# Patient Record
Sex: Female | Born: 1985 | Race: White | Hispanic: No | Marital: Married | Smoking: Current every day smoker
Health system: Southern US, Community
[De-identification: ages and names within clinical notes are randomized; demographics above are authoritative.]

## PROBLEM LIST (undated history)

## (undated) ENCOUNTER — Inpatient Hospital Stay (HOSPITAL_COMMUNITY): Payer: Self-pay

## (undated) DIAGNOSIS — F429 Obsessive-compulsive disorder, unspecified: Secondary | ICD-10-CM

## (undated) DIAGNOSIS — N179 Acute kidney failure, unspecified: Secondary | ICD-10-CM

## (undated) DIAGNOSIS — N289 Disorder of kidney and ureter, unspecified: Secondary | ICD-10-CM

## (undated) DIAGNOSIS — R112 Nausea with vomiting, unspecified: Secondary | ICD-10-CM

## (undated) DIAGNOSIS — F419 Anxiety disorder, unspecified: Secondary | ICD-10-CM

## (undated) DIAGNOSIS — Z9889 Other specified postprocedural states: Secondary | ICD-10-CM

## (undated) DIAGNOSIS — I10 Essential (primary) hypertension: Secondary | ICD-10-CM

## (undated) DIAGNOSIS — R002 Palpitations: Secondary | ICD-10-CM

## (undated) DIAGNOSIS — J189 Pneumonia, unspecified organism: Secondary | ICD-10-CM

## (undated) DIAGNOSIS — J45909 Unspecified asthma, uncomplicated: Secondary | ICD-10-CM

## (undated) DIAGNOSIS — R51 Headache: Secondary | ICD-10-CM

## (undated) DIAGNOSIS — R519 Headache, unspecified: Secondary | ICD-10-CM

## (undated) DIAGNOSIS — F3170 Bipolar disorder, currently in remission, most recent episode unspecified: Secondary | ICD-10-CM

## (undated) DIAGNOSIS — F1490 Cocaine use, unspecified, uncomplicated: Secondary | ICD-10-CM

## (undated) HISTORY — PX: LEG SURGERY: SHX1003

## (undated) HISTORY — DX: Acute kidney failure, unspecified: N17.9

## (undated) HISTORY — PX: HAND SURGERY: SHX662

## (undated) HISTORY — PX: FINGER SURGERY: SHX640

---
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
## 2005-04-18 ENCOUNTER — Inpatient Hospital Stay (HOSPITAL_COMMUNITY): Admission: EM | Admit: 2005-04-18 | Discharge: 2005-05-01 | Payer: Self-pay | Admitting: Emergency Medicine

## 2005-05-17 ENCOUNTER — Encounter: Admission: RE | Admit: 2005-05-17 | Discharge: 2005-06-29 | Payer: Self-pay | Admitting: Orthopedic Surgery

## 2005-06-30 ENCOUNTER — Ambulatory Visit (HOSPITAL_COMMUNITY): Admission: RE | Admit: 2005-06-30 | Discharge: 2005-06-30 | Payer: Self-pay | Admitting: Internal Medicine

## 2005-06-30 ENCOUNTER — Ambulatory Visit: Payer: Self-pay | Admitting: Internal Medicine

## 2005-08-16 ENCOUNTER — Ambulatory Visit (HOSPITAL_COMMUNITY): Admission: RE | Admit: 2005-08-16 | Discharge: 2005-08-16 | Payer: Self-pay | Admitting: Internal Medicine

## 2005-08-16 ENCOUNTER — Encounter (INDEPENDENT_AMBULATORY_CARE_PROVIDER_SITE_OTHER): Payer: Self-pay | Admitting: Internal Medicine

## 2006-12-01 ENCOUNTER — Emergency Department (HOSPITAL_COMMUNITY): Admission: EM | Admit: 2006-12-01 | Discharge: 2006-12-01 | Payer: Self-pay | Admitting: *Deleted

## 2006-12-04 ENCOUNTER — Emergency Department (HOSPITAL_COMMUNITY): Admission: EM | Admit: 2006-12-04 | Discharge: 2006-12-04 | Payer: Self-pay | Admitting: Emergency Medicine

## 2007-02-07 ENCOUNTER — Encounter: Payer: Self-pay | Admitting: Emergency Medicine

## 2007-02-07 ENCOUNTER — Inpatient Hospital Stay (HOSPITAL_COMMUNITY): Admission: EM | Admit: 2007-02-07 | Discharge: 2007-02-13 | Payer: Self-pay | Admitting: Emergency Medicine

## 2007-02-12 ENCOUNTER — Ambulatory Visit: Payer: Self-pay | Admitting: *Deleted

## 2007-02-13 ENCOUNTER — Inpatient Hospital Stay (HOSPITAL_COMMUNITY): Admission: RE | Admit: 2007-02-13 | Discharge: 2007-02-21 | Payer: Self-pay | Admitting: *Deleted

## 2007-07-18 ENCOUNTER — Ambulatory Visit (HOSPITAL_COMMUNITY): Admission: RE | Admit: 2007-07-18 | Discharge: 2007-07-18 | Payer: Self-pay | Admitting: Family Medicine

## 2007-08-17 ENCOUNTER — Ambulatory Visit (HOSPITAL_COMMUNITY): Admission: RE | Admit: 2007-08-17 | Discharge: 2007-08-17 | Payer: Self-pay | Admitting: Family Medicine

## 2007-09-06 ENCOUNTER — Ambulatory Visit (HOSPITAL_COMMUNITY): Admission: RE | Admit: 2007-09-06 | Discharge: 2007-09-06 | Payer: Self-pay | Admitting: Family Medicine

## 2007-12-28 ENCOUNTER — Emergency Department (HOSPITAL_COMMUNITY): Admission: EM | Admit: 2007-12-28 | Discharge: 2007-12-28 | Payer: Self-pay | Admitting: Family Medicine

## 2008-01-06 ENCOUNTER — Emergency Department (HOSPITAL_COMMUNITY): Admission: EM | Admit: 2008-01-06 | Discharge: 2008-01-07 | Payer: Self-pay | Admitting: Family Medicine

## 2008-02-08 ENCOUNTER — Emergency Department (HOSPITAL_COMMUNITY): Admission: EM | Admit: 2008-02-08 | Discharge: 2008-02-08 | Payer: Self-pay | Admitting: Family Medicine

## 2008-02-13 ENCOUNTER — Ambulatory Visit (HOSPITAL_COMMUNITY): Payer: Self-pay | Admitting: Psychology

## 2008-02-27 ENCOUNTER — Ambulatory Visit (HOSPITAL_COMMUNITY): Payer: Self-pay | Admitting: Psychiatry

## 2008-03-06 ENCOUNTER — Other Ambulatory Visit: Payer: Self-pay

## 2008-03-06 ENCOUNTER — Ambulatory Visit: Payer: Self-pay | Admitting: *Deleted

## 2008-03-07 ENCOUNTER — Inpatient Hospital Stay (HOSPITAL_COMMUNITY): Admission: RE | Admit: 2008-03-07 | Discharge: 2008-03-14 | Payer: Self-pay | Admitting: *Deleted

## 2008-03-17 ENCOUNTER — Ambulatory Visit (HOSPITAL_COMMUNITY): Payer: Self-pay | Admitting: Psychology

## 2008-06-25 ENCOUNTER — Ambulatory Visit (HOSPITAL_COMMUNITY): Payer: Self-pay | Admitting: Psychiatry

## 2008-06-30 ENCOUNTER — Ambulatory Visit (HOSPITAL_COMMUNITY): Payer: Self-pay | Admitting: Psychology

## 2008-07-14 ENCOUNTER — Ambulatory Visit (HOSPITAL_COMMUNITY): Payer: Self-pay | Admitting: Psychology

## 2008-08-04 ENCOUNTER — Ambulatory Visit (HOSPITAL_COMMUNITY): Payer: Self-pay | Admitting: Psychiatry

## 2008-08-18 ENCOUNTER — Ambulatory Visit (HOSPITAL_COMMUNITY): Payer: Self-pay | Admitting: Psychology

## 2008-09-01 ENCOUNTER — Ambulatory Visit (HOSPITAL_COMMUNITY): Admission: RE | Admit: 2008-09-01 | Discharge: 2008-09-01 | Payer: Self-pay | Admitting: Family Medicine

## 2008-09-03 ENCOUNTER — Ambulatory Visit (HOSPITAL_COMMUNITY): Payer: Self-pay | Admitting: Psychology

## 2008-09-08 ENCOUNTER — Ambulatory Visit (HOSPITAL_COMMUNITY): Payer: Self-pay | Admitting: Psychiatry

## 2008-10-01 ENCOUNTER — Ambulatory Visit (HOSPITAL_COMMUNITY): Payer: Self-pay | Admitting: Psychiatry

## 2008-10-29 ENCOUNTER — Ambulatory Visit (HOSPITAL_COMMUNITY): Payer: Self-pay | Admitting: Psychiatry

## 2008-11-10 ENCOUNTER — Ambulatory Visit (HOSPITAL_COMMUNITY): Payer: Self-pay | Admitting: Psychology

## 2008-11-15 ENCOUNTER — Emergency Department (HOSPITAL_COMMUNITY): Admission: EM | Admit: 2008-11-15 | Discharge: 2008-11-15 | Payer: Self-pay | Admitting: Family Medicine

## 2008-11-15 ENCOUNTER — Encounter (INDEPENDENT_AMBULATORY_CARE_PROVIDER_SITE_OTHER): Payer: Self-pay | Admitting: Emergency Medicine

## 2008-11-15 ENCOUNTER — Ambulatory Visit: Payer: Self-pay | Admitting: Vascular Surgery

## 2008-11-15 ENCOUNTER — Emergency Department (HOSPITAL_COMMUNITY): Admission: EM | Admit: 2008-11-15 | Discharge: 2008-11-15 | Payer: Self-pay | Admitting: Emergency Medicine

## 2008-11-26 ENCOUNTER — Ambulatory Visit (HOSPITAL_COMMUNITY): Payer: Self-pay | Admitting: Psychiatry

## 2008-12-15 ENCOUNTER — Ambulatory Visit (HOSPITAL_COMMUNITY): Payer: Self-pay | Admitting: Psychology
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
## 2009-01-28 ENCOUNTER — Ambulatory Visit (HOSPITAL_COMMUNITY): Payer: Self-pay | Admitting: Psychiatry

## 2009-02-12 ENCOUNTER — Ambulatory Visit (HOSPITAL_COMMUNITY): Admission: RE | Admit: 2009-02-12 | Discharge: 2009-02-12 | Payer: Self-pay | Admitting: Family Medicine

## 2009-03-23 ENCOUNTER — Ambulatory Visit (HOSPITAL_COMMUNITY): Payer: Self-pay | Admitting: Psychiatry

## 2009-04-04 ENCOUNTER — Emergency Department (HOSPITAL_COMMUNITY): Admission: EM | Admit: 2009-04-04 | Discharge: 2009-04-04 | Payer: Self-pay | Admitting: Emergency Medicine

## 2009-04-10 ENCOUNTER — Ambulatory Visit (HOSPITAL_COMMUNITY): Payer: Self-pay | Admitting: Licensed Clinical Social Worker

## 2009-04-22 ENCOUNTER — Ambulatory Visit (HOSPITAL_COMMUNITY): Payer: Self-pay | Admitting: Licensed Clinical Social Worker

## 2009-05-07 ENCOUNTER — Ambulatory Visit (HOSPITAL_COMMUNITY): Payer: Self-pay | Admitting: Licensed Clinical Social Worker

## 2009-05-25 ENCOUNTER — Ambulatory Visit (HOSPITAL_COMMUNITY): Payer: Self-pay | Admitting: Psychiatry

## 2009-05-25 ENCOUNTER — Ambulatory Visit (HOSPITAL_COMMUNITY): Payer: Self-pay | Admitting: Licensed Clinical Social Worker

## 2009-06-01 ENCOUNTER — Emergency Department (HOSPITAL_COMMUNITY): Admission: EM | Admit: 2009-06-01 | Discharge: 2009-06-01 | Payer: Self-pay | Admitting: Emergency Medicine

## 2009-06-05 ENCOUNTER — Ambulatory Visit (HOSPITAL_COMMUNITY): Payer: Self-pay | Admitting: Licensed Clinical Social Worker

## 2009-06-10 ENCOUNTER — Ambulatory Visit (HOSPITAL_COMMUNITY): Payer: Self-pay | Admitting: Licensed Clinical Social Worker

## 2009-06-17 ENCOUNTER — Ambulatory Visit (HOSPITAL_COMMUNITY): Payer: Self-pay | Admitting: Psychiatry

## 2009-06-24 ENCOUNTER — Ambulatory Visit (HOSPITAL_COMMUNITY): Payer: Self-pay | Admitting: Licensed Clinical Social Worker

## 2009-07-07 ENCOUNTER — Ambulatory Visit (HOSPITAL_COMMUNITY): Payer: Self-pay | Admitting: Licensed Clinical Social Worker

## 2009-07-07 ENCOUNTER — Encounter: Admission: RE | Admit: 2009-07-07 | Discharge: 2009-07-07 | Payer: Self-pay | Admitting: Diagnostic Neuroimaging

## 2009-08-12 ENCOUNTER — Ambulatory Visit (HOSPITAL_COMMUNITY): Payer: Self-pay | Admitting: Psychiatry

## 2009-08-18 ENCOUNTER — Ambulatory Visit (HOSPITAL_COMMUNITY): Payer: Self-pay | Admitting: Licensed Clinical Social Worker

## 2009-08-25 ENCOUNTER — Ambulatory Visit (HOSPITAL_COMMUNITY): Payer: Self-pay | Admitting: Licensed Clinical Social Worker

## 2009-09-01 ENCOUNTER — Ambulatory Visit (HOSPITAL_COMMUNITY): Payer: Self-pay | Admitting: Licensed Clinical Social Worker

## 2009-09-04 ENCOUNTER — Emergency Department (HOSPITAL_COMMUNITY): Admission: EM | Admit: 2009-09-04 | Discharge: 2009-09-05 | Payer: Self-pay | Admitting: Emergency Medicine

## 2009-09-08 ENCOUNTER — Ambulatory Visit (HOSPITAL_COMMUNITY): Payer: Self-pay | Admitting: Licensed Clinical Social Worker

## 2009-09-17 ENCOUNTER — Ambulatory Visit (HOSPITAL_COMMUNITY): Payer: Self-pay | Admitting: Licensed Clinical Social Worker

## 2009-09-29 ENCOUNTER — Ambulatory Visit (HOSPITAL_COMMUNITY): Payer: Self-pay | Admitting: Licensed Clinical Social Worker

## 2009-10-12 ENCOUNTER — Ambulatory Visit (HOSPITAL_COMMUNITY): Payer: Self-pay | Admitting: Psychiatry

## 2009-10-21 ENCOUNTER — Ambulatory Visit (HOSPITAL_COMMUNITY): Payer: Self-pay | Admitting: Licensed Clinical Social Worker

## 2009-10-29 ENCOUNTER — Ambulatory Visit (HOSPITAL_COMMUNITY): Payer: Self-pay | Admitting: Licensed Clinical Social Worker

## 2009-11-19 ENCOUNTER — Ambulatory Visit (HOSPITAL_COMMUNITY): Payer: Self-pay | Admitting: Licensed Clinical Social Worker

## 2009-11-27 ENCOUNTER — Ambulatory Visit (HOSPITAL_COMMUNITY): Payer: Self-pay | Admitting: Licensed Clinical Social Worker

## 2009-12-09 ENCOUNTER — Ambulatory Visit (HOSPITAL_COMMUNITY): Payer: Self-pay | Admitting: Licensed Clinical Social Worker

## 2009-12-25 ENCOUNTER — Ambulatory Visit (HOSPITAL_COMMUNITY): Payer: Self-pay | Admitting: Licensed Clinical Social Worker

## 2009-12-28 ENCOUNTER — Ambulatory Visit: Payer: Self-pay | Admitting: Internal Medicine

## 2010-01-04 ENCOUNTER — Ambulatory Visit (HOSPITAL_COMMUNITY): Payer: Self-pay | Admitting: Psychiatry

## 2010-01-05 ENCOUNTER — Ambulatory Visit (HOSPITAL_COMMUNITY): Payer: Self-pay | Admitting: Licensed Clinical Social Worker

## 2010-01-13 ENCOUNTER — Ambulatory Visit (HOSPITAL_COMMUNITY): Payer: Self-pay | Admitting: Licensed Clinical Social Worker

## 2010-01-28 ENCOUNTER — Emergency Department (HOSPITAL_COMMUNITY)
Admission: EM | Admit: 2010-01-28 | Discharge: 2010-01-28 | Payer: Self-pay | Source: Home / Self Care | Admitting: Emergency Medicine

## 2010-02-01 LAB — URINALYSIS, ROUTINE W REFLEX MICROSCOPIC
Bilirubin Urine: NEGATIVE
Hgb urine dipstick: NEGATIVE
Ketones, ur: NEGATIVE mg/dL
Nitrite: NEGATIVE
Protein, ur: NEGATIVE mg/dL
Specific Gravity, Urine: 1.018 (ref 1.005–1.030)
Urine Glucose, Fasting: NEGATIVE mg/dL
Urobilinogen, UA: 0.2 mg/dL (ref 0.0–1.0)
pH: 7 (ref 5.0–8.0)

## 2010-02-01 LAB — POCT I-STAT, CHEM 8
BUN: 9 mg/dL (ref 6–23)
Calcium, Ion: 1.19 mmol/L (ref 1.12–1.32)
Chloride: 107 mEq/L (ref 96–112)
Creatinine, Ser: 0.7 mg/dL (ref 0.4–1.2)
Glucose, Bld: 100 mg/dL — ABNORMAL HIGH (ref 70–99)
HCT: 43 % (ref 36.0–46.0)
Hemoglobin: 14.6 g/dL (ref 12.0–15.0)
Potassium: 4.1 mEq/L (ref 3.5–5.1)
Sodium: 140 mEq/L (ref 135–145)
TCO2: 27 mmol/L (ref 0–100)

## 2010-02-01 LAB — URINE MICROSCOPIC-ADD ON

## 2010-02-01 LAB — POCT PREGNANCY, URINE: Preg Test, Ur: NEGATIVE

## 2010-02-07 ENCOUNTER — Encounter: Payer: Self-pay | Admitting: Family Medicine

## 2010-02-27 ENCOUNTER — Emergency Department (HOSPITAL_COMMUNITY)
Admission: EM | Admit: 2010-02-27 | Discharge: 2010-02-27 | Disposition: A | Discharge status: Home / Self Care | Payer: Medicare Other | Attending: Emergency Medicine | Admitting: Emergency Medicine

## 2010-02-27 DIAGNOSIS — X789XXA Intentional self-harm by unspecified sharp object, initial encounter: Secondary | ICD-10-CM | POA: Insufficient documentation

## 2010-02-27 DIAGNOSIS — S61509A Unspecified open wound of unspecified wrist, initial encounter: Secondary | ICD-10-CM | POA: Insufficient documentation

## 2010-02-27 LAB — URINALYSIS, ROUTINE W REFLEX MICROSCOPIC
Bilirubin Urine: NEGATIVE
Ketones, ur: NEGATIVE mg/dL
Leukocytes, UA: NEGATIVE
Urine Glucose, Fasting: NEGATIVE mg/dL
pH: 6 (ref 5.0–8.0)

## 2010-02-27 LAB — DIFFERENTIAL
Eosinophils Absolute: 0.3 10*3/uL (ref 0.0–0.7)
Lymphocytes Relative: 38 % (ref 12–46)
Lymphs Abs: 4.6 10*3/uL — ABNORMAL HIGH (ref 0.7–4.0)
Monocytes Absolute: 0.6 10*3/uL (ref 0.1–1.0)
Monocytes Relative: 5 % (ref 3–12)
Neutro Abs: 6.7 10*3/uL (ref 1.7–7.7)
Neutrophils Relative %: 55 % (ref 43–77)

## 2010-02-27 LAB — BASIC METABOLIC PANEL
Calcium: 9.2 mg/dL (ref 8.4–10.5)
GFR calc Af Amer: >60 mL/min (ref >60)
GFR calc non Af Amer: >60 mL/min (ref >60)
Sodium: 139 mEq/L (ref 135–145)

## 2010-02-27 LAB — CBC
Hemoglobin: 14.5 g/dL (ref 12.0–15.0)
MCH: 31.5 pg (ref 26.0–34.0)
MCHC: 36.6 g/dL — ABNORMAL HIGH (ref 30.0–36.0)
MCV: 86.1 fL (ref 78.0–100.0)
Platelets: 275 10*3/uL (ref 150–400)
RDW: 13.5 % (ref 11.5–15.5)
WBC: 12.3 10*3/uL — ABNORMAL HIGH (ref 4.0–10.5)

## 2010-02-27 LAB — URINE MICROSCOPIC-ADD ON

## 2010-02-27 LAB — RAPID URINE DRUG SCREEN, HOSP PERFORMED
Cocaine: NOT DETECTED
Opiates: NOT DETECTED

## 2010-04-01 LAB — BASIC METABOLIC PANEL
BUN: 10 mg/dL (ref 6–23)
CO2: 24 mEq/L (ref 19–32)
Calcium: 9.1 mg/dL (ref 8.4–10.5)
Creatinine, Ser: 0.82 mg/dL (ref 0.4–1.2)
GFR calc Af Amer: >60 mL/min (ref >60)
Glucose, Bld: 95 mg/dL (ref 70–99)

## 2010-04-01 LAB — URINALYSIS, ROUTINE W REFLEX MICROSCOPIC
Hgb urine dipstick: NEGATIVE
Ketones, ur: NEGATIVE mg/dL
Protein, ur: NEGATIVE mg/dL
Urobilinogen, UA: 0.2 mg/dL (ref 0.0–1.0)

## 2010-04-01 LAB — DIFFERENTIAL
Basophils Relative: 0 % (ref 0–1)
Eosinophils Absolute: 0.2 10*3/uL (ref 0.0–0.7)
Eosinophils Relative: 2 % (ref 0–5)
Neutrophils Relative %: 56 % (ref 43–77)

## 2010-04-01 LAB — CBC
MCH: 30.4 pg (ref 26.0–34.0)
MCHC: 34.3 g/dL (ref 30.0–36.0)
MCV: 88.7 fL (ref 78.0–100.0)
Platelets: 332 10*3/uL (ref 150–400)
RBC: 4.33 MIL/uL (ref 3.87–5.11)
RDW: 13.8 % (ref 11.5–15.5)

## 2010-04-01 LAB — D-DIMER, QUANTITATIVE: D-Dimer, Quant: 0.22 ug/mL-FEU (ref 0.00–0.48)

## 2010-04-11 LAB — POCT RAPID STREP A (OFFICE): Streptococcus, Group A Screen (Direct): NEGATIVE

## 2010-04-22 LAB — POCT I-STAT, CHEM 8
Calcium, Ion: 1.22 mmol/L (ref 1.12–1.32)
Glucose, Bld: 99 mg/dL (ref 70–99)
HCT: 40 % (ref 36.0–46.0)
TCO2: 25 mmol/L (ref 0–100)

## 2010-05-03 LAB — POCT PREGNANCY, URINE: Preg Test, Ur: NEGATIVE

## 2010-05-03 LAB — POCT URINALYSIS DIP (DEVICE)
Ketones, ur: NEGATIVE mg/dL
Protein, ur: NEGATIVE mg/dL
Specific Gravity, Urine: 1.015 (ref 1.005–1.030)
Urobilinogen, UA: 0.2 mg/dL (ref 0.0–1.0)
pH: 7.5 (ref 5.0–8.0)

## 2010-05-04 LAB — DIFFERENTIAL
Basophils Absolute: 0.1 10*3/uL (ref 0.0–0.1)
Eosinophils Relative: 0 % (ref 0–5)
Lymphocytes Relative: 5 % — ABNORMAL LOW (ref 12–46)
Lymphocytes Relative: 29 % (ref 12–46)
Lymphs Abs: 1 10*3/uL (ref 0.7–4.0)
Lymphs Abs: 3.4 10*3/uL (ref 0.7–4.0)
Monocytes Relative: 8 % (ref 3–12)
Neutro Abs: 16.9 10*3/uL — ABNORMAL HIGH (ref 1.7–7.7)
Neutrophils Relative %: 92 % — ABNORMAL HIGH (ref 43–77)
Neutrophils Relative %: 62 % (ref 43–77)

## 2010-05-04 LAB — URINALYSIS, ROUTINE W REFLEX MICROSCOPIC
Hgb urine dipstick: NEGATIVE
Protein, ur: NEGATIVE mg/dL
Specific Gravity, Urine: 1.026 (ref 1.005–1.030)
Urobilinogen, UA: 0.2 mg/dL (ref 0.0–1.0)

## 2010-05-04 LAB — CBC
HCT: 44 % (ref 36.0–46.0)
HCT: 40.4 % (ref 36.0–46.0)
Hemoglobin: 15.2 g/dL — ABNORMAL HIGH (ref 12.0–15.0)
MCHC: 34.5 g/dL (ref 30.0–36.0)
MCV: 90.5 fL (ref 78.0–100.0)
Platelets: 290 10*3/uL (ref 150–400)
RBC: 4.86 MIL/uL (ref 3.87–5.11)
RBC: 4.4 MIL/uL (ref 3.87–5.11)
WBC: 18.3 10*3/uL — ABNORMAL HIGH (ref 4.0–10.5)
WBC: 11.9 10*3/uL — ABNORMAL HIGH (ref 4.0–10.5)

## 2010-05-04 LAB — RAPID URINE DRUG SCREEN, HOSP PERFORMED
Amphetamines: NOT DETECTED
Barbiturates: NOT DETECTED
Benzodiazepines: NOT DETECTED

## 2010-05-04 LAB — HEPATIC FUNCTION PANEL
Alkaline Phosphatase: 82 U/L (ref 39–117)
Total Bilirubin: 0.5 mg/dL (ref 0.3–1.2)
Total Protein: 6.8 g/dL (ref 6.0–8.3)

## 2010-05-04 LAB — COMPREHENSIVE METABOLIC PANEL
AST: 19 U/L (ref 0–37)
BUN: 9 mg/dL (ref 6–23)
CO2: 24 mEq/L (ref 19–32)
Calcium: 9.5 mg/dL (ref 8.4–10.5)
Chloride: 105 mEq/L (ref 96–112)
Creatinine, Ser: 0.67 mg/dL (ref 0.4–1.2)
GFR calc non Af Amer: >60 mL/min (ref >60)
Glucose, Bld: 125 mg/dL — ABNORMAL HIGH (ref 70–99)
Total Bilirubin: 0.4 mg/dL (ref 0.3–1.2)

## 2010-05-04 LAB — RPR: RPR Ser Ql: NONREACTIVE

## 2010-05-04 LAB — PROLACTIN: Prolactin: 48.8 ng/mL

## 2010-05-04 LAB — URINE MICROSCOPIC-ADD ON
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
## 2010-06-01 NOTE — Group Therapy Note (Signed)
[name], [name]           ACCOUNT NO.:  [id number]   MEDICAL RECORD NO.:  [id number]          PATIENT TYPE:  OUT   LOCATION:  RDC                           FACILITY:  [facility]   PHYSICIAN:  Syed T. Arfeen, M.D.   DATE OF BIRTH:  1985/06/16                                 PROGRESS NOTE   The patient came in today for her follow-up appointment in [place]  office.  The patient continued to endorse mood swings, agitation and  getting easily irritable. She had run out of her BuSpar on Friday and  noticed increased anxiety and agitation.  Initially she thought that  BuSpar was not helping.  However, when she ran out she felt more  anxious.  She also endorsed recently visual hallucination which she  described as that she is seeing things which are not there.  She  endorses she also feels sometimes that someone calls her name.  She  continued to endorse extensive symptoms of OCD. She has been spending a  lot of time for eye makeup or organizing things. Lately she has been so  sensitive if someone touches her. She is concerned about her  relationship with the boyfriend due to increased OCD symptoms.  She  reported that she has been sleeping good on her current medication.  However she still had difficulty controlling her mood swing and  agitation.  She denies any suicidal thoughts or homicidal thoughts.  However, in past 3-4 weeks she did have thoughts of hurting her ex-  boyfriend. It is unclear to her why she had only one time that thought.  She endorses that these thoughts resolved on their own.  She has been  taking Lamictal 300 mg daily, Remeron 30 mg 2 at bedtime, Neurontin 600  mg 1 t.i.d., Prozac 40 mg 2 daily, Seroquel 300 mg 2 at bedtime.  We  have started BuSpar on her last visit which she believes now helped.  She has been seeing Dr. Kieth Brightly for her regular counseling.   ASSESSMENT:  Bipolar disorder, rule out obsessive-compulsive disorder.   PLAN:  I talked to her  increasing her BuSpar to 10 mg twice a day since she  felt better on BuSpar.  I also recommended to try low dose lithium to  control her agitation and mood swings.  She had mentioned that she does  not feel the Lamictal helps her agitation.  However, she is afraid to  stop the medicine wondering if agitation gets worse.  I explained that  she can try lithium and if she is able to tolerate the side effects and  feel better we can gradually cut down the Lamictal.  I explained the  risk and benefits of the lithium and in the future if she starts  tolerating the lithium, we may cut down the Lamictal. She is taking  moderate dose of Seroquel, Prozac and Remeron.  She does not want to  take medicine that causes weight gain since she tried Abilify, Zyprexa  in the past.  She also tried Depakote and stopped due to the weight  gain.  I also recommended to see her primary  care doctor, Dr. Renette Butters and  [facility] in [place], to have a CT scan of the head to  rule out any organic cause of visual hallucinations.  The patient has an  appointment this coming Wednesday, and she will discuss to have a CT  scan of her head on that appointment.  The patient was recommended to  continue seeing Dr. Kieth Brightly for counseling, and I will see her in 3  weeks.  The patient was explained if her visual hallucinations or  suicidal or homicidal thoughts gets worse, then she should call us or  visit the ER which she acknowledged. Follow-up in 3 weeks.     Syed T. Lolly Mustache, M.D.  Electronically Signed    STA/MEDQ  D:  09/08/2008  T:  09/08/2008  Job:  [id number]
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
## 2010-06-04 NOTE — Op Note (Signed)
[name], [name]           ACCOUNT NO.:  [id number]   MEDICAL RECORD NO.:  [id number]          PATIENT TYPE:  INP   LOCATION:  5005                         FACILITY:  [facility]   PHYSICIAN:  Claude Manges. Whitfield, M.D.DATE OF BIRTH:  March 28, 1985   DATE OF PROCEDURE:  04/19/2005  DATE OF DISCHARGE:                                 OPERATIVE REPORT   PREOPERATIVE DIAGNOSIS:  Compartment syndrome, right leg.   POSTOPERATIVE DIAGNOSIS:  Compartment syndrome, right leg.   PROCEDURE:  Four compartment fasciotomy right leg.   SURGEON:  Claude Manges. Cleophas Dunker, M.D.   ASSISTANT:  Richardean Canal, P.A.-C.   ANESTHESIA:  General orotracheal.   COMPLICATIONS:  None.   PROCEDURE:  With the patient comfortable on the operating table and under  general orotracheal anesthesia, a tourniquet was applied to the right thigh.  The right leg was then prepped with Betadine from the mid-thigh to the tips  of the toes.  I did obtain compartment pressure measurements of the  posterior, the deep posterior compartment, and the anterolateral  compartments. Blood pressure was approximately 120/70, the lateral  compartment pressures were in the low 50 mmHg, and probably 30 range in the  posterior compartment. The compartments felt supple, but the patient has had  loss of sensation in the dorsum of her foot and loss of extension of the  great toe and pain with passive flexion/extension of the toes.   The leg was then prepped again with Betadine from the tips of toes to the  mid tourniquet.  Sterile draping was performed.   A longitudinal incision was made over the anterior aspect of the leg halfway  between the tibia and the fibula extending probably 7 cm.  Via sharp  dissection, the incision was carried down to the subcutaneous tissue.  The  leg was elevated and Esmarch exsanguinated with the tourniquet to 325 mmHg  prior to making incision. Via blunt dissection, the soft tissue was then  elevated off the  compartments. The superficial fascia was incised  transversely. The anterior and lateral compartments were identified. The  intermuscular septum was identified that separated the anterior and lateral  compartments. Under direct visualization, fasciotomy was performed along the  anterior compartment initially and in the lateral compartment visualizing  any neurovascular structures and retracted them well out of the operative  field.  The wound was then irrigated and a sterile gauze applied.   A second incision was made longitudinally about 3 cm medial and posterior to  the posterior border of the tibia and there was about a 10 cm gap between  the lateral and medial incisions. Via sharp dissection, the incision was  carried down to the subcutaneous tissue.  Via blunt dissection, the  subcutaneous tissue was elevated off the compartments.  The neurovascular  structures were separated from the fascia bluntly and then elevated. The  fascia along the deep posterior musculature was then incised anteriorly and  posteriorly under direct visualization.  The intermuscular septum was  identified and the superficial deep compartment was also incised was  decompressed.  There was very little bulging of the musculature in the  deep  posterior and the posterior compartments.  There was some bulging of the  muscle in the anterior and the lateral compartments, but the muscle would  twitch and had a normal appearance.   The wounds were irrigated with saline solution.  The vacuum dressings were  then applied to both of the incisions as they were left open.  We had a very  nice seal after applying pressure.  I then aspirated the knee, the patient  had a hematoma that was painful preoperatively.  I removed approximately 45  mL of old blood with a very nice decompression.  A Band-Aid was applied.   The tourniquet was deflated with immediate capillary refill to the toes and  to the skin between the incisions.   The patient tolerated without  complications.      Claude Manges. Cleophas Dunker, M.D.  Electronically Signed     PWW/MEDQ  D:  04/19/2005  T:  04/19/2005  Job:  [id number]
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
## 2010-06-04 NOTE — Op Note (Signed)
NAMEMARIZOL, BORROR           ACCOUNT NO.:  [id number]   MEDICAL RECORD NO.:  [id number]          PATIENT TYPE:  INP   LOCATION:  5005                         FACILITY:  [facility]   PHYSICIAN:  Claude Manges. Whitfield, M.D.DATE OF BIRTH:  [birth year]   DATE OF PROCEDURE:  DATE OF DISCHARGE:                                 OPERATIVE REPORT   Audio too short to transcribe (less than 5 seconds)      Claude Manges. Cleophas Dunker, M.D.     PWW/MEDQ  D:  04/19/2005  T:  04/19/2005  Job:  [id number]
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
## 2010-06-04 NOTE — Op Note (Signed)
NAMEQUANTA, ROBERTSHAW           ACCOUNT NO.:  [id number]   MEDICAL RECORD NO.:  [id number]          PATIENT TYPE:  INP   LOCATION:  5005                         FACILITY:  [facility]   PHYSICIAN:  Claude Manges. Whitfield, M.D.DATE OF BIRTH:  08-07-1985   DATE OF PROCEDURE:  04/23/2005  DATE OF DISCHARGE:                                 OPERATIVE REPORT   PREOPERATIVE DIAGNOSIS:  Open four compartment fasciotomy wounds, right leg.   POSTOPERATIVE DIAGNOSIS:  Open four compartment fasciotomy wounds, right  leg.   PROCEDURE:  I&D of bilateral fasciotomy wounds, right leg. Primary closure  medial leg wound. Application of vacuum dressing lateral leg wound.   SURGEON:  Claude Manges. Cleophas Dunker, M.D.   ASSISTANT:  Arlys John D. Petrarca, P.A.-C.   ANESTHESIA:  General.   COMPLICATIONS:  None.   PROCEDURE:  The patient comfortable on operating table and under general  anesthesia, dressings were removed from the right lower extremity including  the previously applied vacuum dressings. There were multiple blisters  medially, laterally, and inferiorly about the leg from the previous  fracture. The fasciotomy wounds appeared to be nice and clean.  There was no  evidence of avascular tissue or avascular muscle. The leg was then prepped  with Betadine solution from the ankle to the knee.  Sterile draping was  performed. We could approximate the skin edges along the medial wound but  could not laterally so we irrigated with saline both of the wounds. The  medial wound was then closed in several layers with 0-0 and 2-0 Vicryl and  then a running 3-0 subcuticular Prolene over Steri-Strips. Any remaining  blisters were decompressed and we applied Adaptic dressing and 4x4 gauze.  The lateral wound we could not close and accordingly reirrigated and applied  a vacuum dressing for subsequent reattempt at the closure or split-thickness  skin graft. The vacuum dressing was applied.  We had very nice suction.  Sterile drapings were applied.  The patient was then returned to the post  anesthesia recovery room in satisfactory condition.      Claude Manges. Cleophas Dunker, M.D.  Electronically Signed     PWW/MEDQ  D:  04/23/2005  T:  04/24/2005  Job:  [id number]
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
## 2010-06-04 NOTE — Op Note (Signed)
NAMEMUNTAHA, VERMETTE           ACCOUNT NO.:  [id number]   MEDICAL RECORD NO.:  [id number]          PATIENT TYPE:  INP   LOCATION:  5005                         FACILITY:  [facility]   PHYSICIAN:  Claude Manges. Whitfield, M.D.DATE OF BIRTH:  1985-09-14   DATE OF PROCEDURE:  04/23/2005  DATE OF DISCHARGE:                                 OPERATIVE REPORT   PREOPERATIVE DIAGNOSIS:  Open fasciotomy wounds, right leg.   POSTOPERATIVE DIAGNOSIS:  Open fasciotomy wounds, right leg.   PROCEDURE:  1.  Irrigation debridement both fasciotomy wounds, right leg.  2.  Primary closure of medial leg wound.  3.  Application of vacuum dressing to lateral leg wound.   SURGEON:  Claude Manges. Cleophas Dunker, M.D.   ASSISTANT:  Arlys John D. Petrarca, P.A.-C.   ANESTHESIA:  General.   COMPLICATIONS:  None.   PROCEDURE:  With the patient comfortable on the operating room table, and  under general anesthesia; the right lower extremity was prepped with  Betadine solution from the ankle to the knee.  Sterile draping was  performed.  The patient is 4 days status post 4-compartment fasciotomies to  the right lower extremity with application of a vacuum dressing.   The vacuum dressings were removed; and, at that point, we reprepped the leg  with Betadine scrub and again sterile technique.  NOTE:  IN FACT I'M GOING  TO START ALL OVER AGAIN, SO FORGET THAT AND I WILL REDICTATE.      Claude Manges. Cleophas Dunker, M.D.  Electronically Signed     PWW/MEDQ  D:  04/23/2005  T:  04/24/2005  Job:  [id number]

## 2010-08-27 ENCOUNTER — Emergency Department (HOSPITAL_COMMUNITY): Payer: Medicare Other

## 2010-08-27 ENCOUNTER — Observation Stay (HOSPITAL_COMMUNITY)
Admission: EM | Admit: 2010-08-27 | Discharge: 2010-08-29 | Disposition: A | Discharge status: Home / Self Care | Payer: Medicare Other | Attending: Internal Medicine | Admitting: Internal Medicine

## 2010-08-27 DIAGNOSIS — D72829 Elevated white blood cell count, unspecified: Secondary | ICD-10-CM | POA: Insufficient documentation

## 2010-08-27 DIAGNOSIS — R1031 Right lower quadrant pain: Secondary | ICD-10-CM | POA: Insufficient documentation

## 2010-08-27 DIAGNOSIS — R51 Headache: Secondary | ICD-10-CM | POA: Insufficient documentation

## 2010-08-27 DIAGNOSIS — N63 Unspecified lump in unspecified breast: Secondary | ICD-10-CM | POA: Insufficient documentation

## 2010-08-27 DIAGNOSIS — J690 Pneumonitis due to inhalation of food and vomit: Principal | ICD-10-CM | POA: Insufficient documentation

## 2010-08-27 DIAGNOSIS — R0789 Other chest pain: Secondary | ICD-10-CM | POA: Insufficient documentation

## 2010-08-27 DIAGNOSIS — R112 Nausea with vomiting, unspecified: Secondary | ICD-10-CM | POA: Insufficient documentation

## 2010-08-27 DIAGNOSIS — F1910 Other psychoactive substance abuse, uncomplicated: Secondary | ICD-10-CM | POA: Insufficient documentation

## 2010-08-27 DIAGNOSIS — R197 Diarrhea, unspecified: Secondary | ICD-10-CM | POA: Insufficient documentation

## 2010-08-27 DIAGNOSIS — Z79899 Other long term (current) drug therapy: Secondary | ICD-10-CM | POA: Insufficient documentation

## 2010-08-27 DIAGNOSIS — M79609 Pain in unspecified limb: Secondary | ICD-10-CM | POA: Insufficient documentation

## 2010-08-27 DIAGNOSIS — G40909 Epilepsy, unspecified, not intractable, without status epilepticus: Secondary | ICD-10-CM | POA: Insufficient documentation

## 2010-08-27 DIAGNOSIS — R0602 Shortness of breath: Secondary | ICD-10-CM | POA: Insufficient documentation

## 2010-08-27 DIAGNOSIS — F17200 Nicotine dependence, unspecified, uncomplicated: Secondary | ICD-10-CM | POA: Insufficient documentation

## 2010-08-27 DIAGNOSIS — R1012 Left upper quadrant pain: Secondary | ICD-10-CM | POA: Insufficient documentation

## 2010-08-27 DIAGNOSIS — F319 Bipolar disorder, unspecified: Secondary | ICD-10-CM | POA: Insufficient documentation

## 2010-08-27 DIAGNOSIS — F1027 Alcohol dependence with alcohol-induced persisting dementia: Secondary | ICD-10-CM | POA: Insufficient documentation

## 2010-08-27 DIAGNOSIS — F10229 Alcohol dependence with intoxication, unspecified: Secondary | ICD-10-CM | POA: Insufficient documentation

## 2010-08-27 LAB — COMPREHENSIVE METABOLIC PANEL
ALT: 19 U/L (ref 0–35)
Alkaline Phosphatase: 74 U/L (ref 39–117)
BUN: 9 mg/dL (ref 6–23)
CO2: 21 mEq/L (ref 19–32)
Chloride: 103 mEq/L (ref 96–112)
GFR calc Af Amer: >60 mL/min (ref >60)
Glucose, Bld: 177 mg/dL — ABNORMAL HIGH (ref 70–99)
Potassium: 3.4 mEq/L — ABNORMAL LOW (ref 3.5–5.1)
Sodium: 139 mEq/L (ref 135–145)
Total Bilirubin: 0.2 mg/dL — ABNORMAL LOW (ref 0.3–1.2)
Total Protein: 7.6 g/dL (ref 6.0–8.3)

## 2010-08-27 LAB — URINALYSIS, ROUTINE W REFLEX MICROSCOPIC
Bilirubin Urine: NEGATIVE
Hgb urine dipstick: NEGATIVE
Ketones, ur: NEGATIVE mg/dL
Specific Gravity, Urine: 1.02 (ref 1.005–1.030)
Urobilinogen, UA: 0.2 mg/dL (ref 0.0–1.0)

## 2010-08-27 LAB — RAPID URINE DRUG SCREEN, HOSP PERFORMED
Amphetamines: NOT DETECTED
Barbiturates: NOT DETECTED
Cocaine: NOT DETECTED
Opiates: POSITIVE — AB
Tetrahydrocannabinol: POSITIVE — AB

## 2010-08-27 LAB — CBC
HCT: 45.3 % (ref 36.0–46.0)
MCV: 89.5 fL (ref 78.0–100.0)
RBC: 5.06 MIL/uL (ref 3.87–5.11)
WBC: 17.1 10*3/uL — ABNORMAL HIGH (ref 4.0–10.5)
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
## 2010-08-28 ENCOUNTER — Observation Stay (HOSPITAL_COMMUNITY): Payer: Medicare Other

## 2010-08-28 LAB — BASIC METABOLIC PANEL
Calcium: 8.7 mg/dL (ref 8.4–10.5)
GFR calc non Af Amer: >60 mL/min (ref >60)
Sodium: 142 mEq/L (ref 135–145)

## 2010-08-28 LAB — CBC
MCH: 31.3 pg (ref 26.0–34.0)
MCHC: 34.2 g/dL (ref 30.0–36.0)
Platelets: 245 10*3/uL (ref 150–400)
RBC: 3.99 MIL/uL (ref 3.87–5.11)

## 2010-08-28 MED ORDER — IOHEXOL 300 MG/ML  SOLN
100.0000 mL | Freq: Once | INTRAMUSCULAR | Status: AC | PRN
  Administered 2010-08-28: 100 mL via INTRAVENOUS

## 2010-08-29 LAB — CBC
Platelets: 241 10*3/uL (ref 150–400)
RBC: 4.09 MIL/uL (ref 3.87–5.11)
WBC: 13.4 10*3/uL — ABNORMAL HIGH (ref 4.0–10.5)
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
## 2010-10-07 LAB — URINE MICROSCOPIC-ADD ON

## 2010-10-07 LAB — BASIC METABOLIC PANEL
CO2: 26
Chloride: 105
Creatinine, Ser: 0.66
GFR calc Af Amer: >60

## 2010-10-07 LAB — URINALYSIS, ROUTINE W REFLEX MICROSCOPIC
Bilirubin Urine: NEGATIVE
Glucose, UA: NEGATIVE
Ketones, ur: NEGATIVE
Leukocytes, UA: NEGATIVE
pH: 6

## 2010-10-07 LAB — RAPID URINE DRUG SCREEN, HOSP PERFORMED
Barbiturates: NOT DETECTED
Benzodiazepines: POSITIVE — AB
Cocaine: NOT DETECTED
Opiates: NOT DETECTED

## 2010-10-07 LAB — CBC
HCT: 37.1
Hemoglobin: 12.9
MCHC: 34.6
MCV: 89.4
RBC: 4.15

## 2010-10-07 LAB — CK TOTAL AND CKMB (NOT AT ARMC)
CK, MB: 4.9 — ABNORMAL HIGH
Total CK: 232 — ABNORMAL HIGH

## 2010-10-22 LAB — RAPID URINE DRUG SCREEN, HOSP PERFORMED
Barbiturates: NOT DETECTED
Cocaine: POSITIVE — AB
Opiates: NOT DETECTED

## 2010-10-22 LAB — URINALYSIS, ROUTINE W REFLEX MICROSCOPIC
Glucose, UA: NEGATIVE mg/dL
Hgb urine dipstick: NEGATIVE
Ketones, ur: 15 mg/dL — AB
Protein, ur: 30 mg/dL — AB

## 2010-10-22 LAB — URINE MICROSCOPIC-ADD ON

## 2010-10-22 LAB — POCT PREGNANCY, URINE: Preg Test, Ur: NEGATIVE

## 2010-10-26 LAB — DIFFERENTIAL
Basophils Absolute: 0
Lymphocytes Relative: 17
Lymphs Abs: 1.9
Monocytes Absolute: 0.7
Monocytes Relative: 6
Neutro Abs: 8.4 — ABNORMAL HIGH

## 2010-10-26 LAB — RAPID URINE DRUG SCREEN, HOSP PERFORMED
Amphetamines: NOT DETECTED
Benzodiazepines: NOT DETECTED

## 2010-10-26 LAB — BASIC METABOLIC PANEL
Calcium: 9.4
GFR calc Af Amer: >60
GFR calc non Af Amer: >60
Sodium: 135

## 2010-10-26 LAB — ETHANOL: Alcohol, Ethyl (B): <5

## 2010-10-26 LAB — CBC
Hemoglobin: 15
RBC: 4.74
WBC: 11.2 — ABNORMAL HIGH

## 2010-12-16 ENCOUNTER — Encounter: Payer: Self-pay | Admitting: Adult Health

## 2010-12-16 ENCOUNTER — Emergency Department (HOSPITAL_COMMUNITY)
Admission: EM | Admit: 2010-12-16 | Discharge: 2010-12-16 | Disposition: A | Discharge status: Home / Self Care | Payer: Medicare Other | Attending: Emergency Medicine | Admitting: Emergency Medicine

## 2010-12-16 DIAGNOSIS — E876 Hypokalemia: Secondary | ICD-10-CM | POA: Insufficient documentation

## 2010-12-16 DIAGNOSIS — R10819 Abdominal tenderness, unspecified site: Secondary | ICD-10-CM | POA: Insufficient documentation

## 2010-12-16 DIAGNOSIS — N12 Tubulo-interstitial nephritis, not specified as acute or chronic: Secondary | ICD-10-CM | POA: Insufficient documentation

## 2010-12-16 DIAGNOSIS — R000 Tachycardia, unspecified: Secondary | ICD-10-CM | POA: Insufficient documentation

## 2010-12-16 LAB — COMPREHENSIVE METABOLIC PANEL
ALT: 13 U/L (ref 0–35)
AST: 15 U/L (ref 0–37)
Alkaline Phosphatase: 76 U/L (ref 39–117)
CO2: 26 mEq/L (ref 19–32)
Chloride: 103 mEq/L (ref 96–112)
Creatinine, Ser: 0.87 mg/dL (ref 0.50–1.10)
GFR calc non Af Amer: >90 mL/min (ref >90)
Sodium: 139 mEq/L (ref 135–145)
Total Bilirubin: 0.5 mg/dL (ref 0.3–1.2)

## 2010-12-16 LAB — URINALYSIS, ROUTINE W REFLEX MICROSCOPIC
Glucose, UA: NEGATIVE mg/dL
Protein, ur: 30 mg/dL — AB

## 2010-12-16 LAB — CBC
HCT: 44.4 % (ref 36.0–46.0)
Hemoglobin: 16.3 g/dL — ABNORMAL HIGH (ref 12.0–15.0)
MCHC: 36.7 g/dL — ABNORMAL HIGH (ref 30.0–36.0)

## 2010-12-16 LAB — POCT PREGNANCY, URINE: Preg Test, Ur: NEGATIVE

## 2010-12-16 MED ORDER — LEVOFLOXACIN 250 MG PO TABS: 750.0000 mg | ORAL_TABLET | Freq: Every day | ORAL | Status: AC

## 2010-12-16 MED ORDER — OXYCODONE-ACETAMINOPHEN 5-325 MG PO TABS
2.0000 | ORAL_TABLET | Freq: Once | ORAL | Status: AC
  Administered 2010-12-16: 2 via ORAL
  Filled 2010-12-16: qty 1
  Filled 2010-12-16: qty 2

## 2010-12-16 MED ORDER — POTASSIUM CHLORIDE CRYS ER 20 MEQ PO TBCR
40.0000 meq | EXTENDED_RELEASE_TABLET | Freq: Once | ORAL | Status: AC
  Administered 2010-12-16: 40 meq via ORAL
  Filled 2010-12-16: qty 2

## 2010-12-16 NOTE — ED Notes (Signed)
Pt st's she's been having bad pains in her back and underneath her ribs x1 week.  St's she had "gall bladder pain" and that pain radiated to R shoulder, now her lower back hurts, st's she thinks she has a UTI.  Also complains of constant headaches, also complains of nausea.

## 2010-12-16 NOTE — ED Provider Notes (Signed)
History     CSN: [id number] Arrival date & time: 12/16/2010  4:19 PM   First MD Initiated Contact with Patient 12/16/10 1747      Chief Complaint  Patient presents with  . Back Pain    (Consider location/radiation/quality/duration/timing/severity/associated sxs/prior treatment) Patient is a 25 y.o. female presenting with dysuria. The history is provided by the patient.  Dysuria  This is a new problem. The current episode started 2 days ago. The problem occurs every urination. The problem has been gradually worsening. The quality of the pain is described as burning. The pain is severe. The maximum temperature recorded prior to her arrival was 100 to 100.9 F. The fever has been present for less than 1 day. She is sexually active. There is no history of pyelonephritis. Associated symptoms include chills, nausea, frequency, hematuria, urgency and flank pain. Pertinent negatives include no vomiting, no discharge and no possible pregnancy. She has tried NSAIDs for the symptoms.  c/o chronic bilateral upper abd pain as well, with no aggravating or relieving factors, tight in nature. Has not tried anything for the pain. Has had abd CT since the pain started with no dx.   History reviewed. No pertinent past medical history.  Past Surgical History  Procedure Date  . Leg surgery   . Kidney failure   . Hypertension     History reviewed. No pertinent family history.  History  Substance Use Topics  . Smoking status: Not on file  . Smokeless tobacco: Not on file  . Alcohol Use:     Review of Systems  Constitutional: Positive for fever and chills. Negative for appetite change and fatigue.  HENT: Negative for ear pain, congestion, trouble swallowing, neck pain and neck stiffness.   Eyes: Negative for pain and visual disturbance.  Respiratory: Negative for cough and shortness of breath.   Cardiovascular: Negative for chest pain and palpitations.  Gastrointestinal: Positive for nausea and  abdominal pain. Negative for vomiting, diarrhea, constipation, blood in stool and abdominal distention.  Genitourinary: Positive for dysuria, urgency, frequency, hematuria and flank pain. Negative for vaginal bleeding and vaginal discharge.  Musculoskeletal: Positive for back pain. Negative for gait problem.  Skin: Negative for rash and wound.  Neurological: Positive for headaches. Negative for dizziness, seizures, weakness and numbness.  Psychiatric/Behavioral: Negative for behavioral problems and confusion.    Allergies  Amoxicillin  Home Medications   Current Outpatient Rx  Name Route Sig Dispense Refill  . BUPROPION HCL ER (XL) 300 MG PO TB24 Oral Take 300 mg by mouth daily.      . DULOXETINE HCL 30 MG PO CPEP Oral Take 30 mg by mouth daily.      Marland Kitchen LORAZEPAM 1 MG PO TABS Oral Take 1 mg by mouth every 8 (eight) hours as needed. anxiety     . MEDROXYPROGESTERONE ACETATE 150 MG/ML IM SUSP Intramuscular Inject 150 mg into the muscle every 3 (three) months. Due next week(first week of December)     . NAPROXEN SODIUM 220 MG PO TABS Oral Take 440 mg by mouth as needed. Pain.       BP 141/97  Pulse 104  Temp(Src) 98.2 F (36.8 C) (Oral)  Resp 16  SpO2 100%  Physical Exam  Nursing note and vitals reviewed. Constitutional: She is oriented to person, place, and time. She appears well-developed and well-nourished.       Uncomfortable appearing  HENT:  Head: Normocephalic and atraumatic.  Right Ear: External ear normal.  Mouth/Throat: Oropharynx is clear  and moist. No oropharyngeal exudate.  Eyes: EOM are normal. Pupils are equal, round, and reactive to light.  Neck: Normal range of motion. Neck supple.  Cardiovascular: Regular rhythm and intact distal pulses.   No murmur heard.      Tachycardia with rate around 100  Pulmonary/Chest: Effort normal and breath sounds normal. No respiratory distress. She has no wheezes.  Abdominal: Soft. Bowel sounds are normal. She exhibits no  distension and no mass. There is no guarding.       Mild suprapubic tenderness. Mild bilateral upper abd tenderness  Genitourinary:       Right CVA tenderness  Musculoskeletal: Normal range of motion. She exhibits no edema.       Tenderness to deep palpation of bilateral flank  Lymphadenopathy:    She has no cervical adenopathy.  Neurological: She is alert and oriented to person, place, and time. No cranial nerve deficit.  Skin: Skin is warm and dry. No rash noted. No pallor.  Psychiatric: She has a normal mood and affect. Her behavior is normal.    ED Course  Procedures (including critical care time)  Labs Reviewed  CBC - Abnormal; Notable for the following:    Hemoglobin 16.3 (*)    MCHC 36.7 (*)    All other components within normal limits  URINALYSIS, ROUTINE W REFLEX MICROSCOPIC - Abnormal; Notable for the following:    Color, Urine AMBER (*) BIOCHEMICALS MAY BE AFFECTED BY COLOR   APPearance CLOUDY (*)    Hgb urine dipstick SMALL (*)    Ketones, ur 15 (*)    Protein, ur 30 (*)    Nitrite POSITIVE (*)    Leukocytes, UA MODERATE (*)    All other components within normal limits  COMPREHENSIVE METABOLIC PANEL - Abnormal; Notable for the following:    Potassium 3.1 (*)    All other components within normal limits  URINE MICROSCOPIC-ADD ON - Abnormal; Notable for the following:    Squamous Epithelial / LPF MANY (*)    Bacteria, UA MANY (*)    All other components within normal limits  POCT PREGNANCY, URINE  POCT PREGNANCY, URINE   No results found.   1. Pyelonephritis   2. Hypokalemia       MDM  Labs reviewed, UTI. Hypokalemia (have given supplementation). Renal function normal, no leukocytosis. Exam consistent with pyelonephritis. Will tx with abx.        Elwyn Reach [place], [place] 12/16/10 2039

## 2010-12-16 NOTE — ED Notes (Signed)
Pt thinks she has a uti dysuria, bilateral abdominal pain that radiates to bilateral flanks.

## 2010-12-16 NOTE — ED Notes (Signed)
500cc bolus infused, switched rate to 125cc/hr

## 2010-12-17 NOTE — ED Provider Notes (Signed)
Medical screening examination/treatment/procedure(s) were performed by non-physician practitioner and as supervising physician I was immediately available for consultation/collaboration.   Veron Senner, MD 12/17/10 0023 

## 2011-02-07 ENCOUNTER — Emergency Department (INDEPENDENT_AMBULATORY_CARE_PROVIDER_SITE_OTHER)
Admission: EM | Admit: 2011-02-07 | Discharge: 2011-02-07 | Disposition: A | Discharge status: Home / Self Care | Payer: Medicare Other | Source: Home / Self Care | Attending: Emergency Medicine | Admitting: Emergency Medicine

## 2011-02-07 ENCOUNTER — Encounter (HOSPITAL_COMMUNITY): Payer: Self-pay

## 2011-02-07 DIAGNOSIS — K921 Melena: Secondary | ICD-10-CM

## 2011-02-07 DIAGNOSIS — F4001 Agoraphobia with panic disorder: Secondary | ICD-10-CM | POA: Diagnosis not present

## 2011-02-07 DIAGNOSIS — F0630 Mood disorder due to known physiological condition, unspecified: Secondary | ICD-10-CM | POA: Diagnosis not present

## 2011-02-07 DIAGNOSIS — R197 Diarrhea, unspecified: Secondary | ICD-10-CM

## 2011-02-07 DIAGNOSIS — N390 Urinary tract infection, site not specified: Secondary | ICD-10-CM

## 2011-02-07 DIAGNOSIS — R1012 Left upper quadrant pain: Secondary | ICD-10-CM

## 2011-02-07 HISTORY — DX: Essential (primary) hypertension: I10

## 2011-02-07 LAB — POCT URINALYSIS DIP (DEVICE)
Bilirubin Urine: NEGATIVE
Glucose, UA: NEGATIVE mg/dL
Ketones, ur: NEGATIVE mg/dL
Specific Gravity, Urine: 1.02 (ref 1.005–1.030)

## 2011-02-07 LAB — POCT I-STAT, CHEM 8
BUN: 5 mg/dL — ABNORMAL LOW (ref 6–23)
Creatinine, Ser: 0.8 mg/dL (ref 0.50–1.10)
Glucose, Bld: 82 mg/dL (ref 70–99)
Hemoglobin: 15.3 g/dL — ABNORMAL HIGH (ref 12.0–15.0)
TCO2: 25 mmol/L (ref 0–100)

## 2011-02-07 LAB — URINE CULTURE: Colony Count: 35000

## 2011-02-07 MED ORDER — HYDROCORTISONE ACE-PRAMOXINE 1-1 % RE CREA: TOPICAL_CREAM | Freq: Two times a day (BID) | RECTAL | Status: AC

## 2011-02-07 MED ORDER — SULFAMETHOXAZOLE-TMP DS 800-160 MG PO TABS: 1.0000 | ORAL_TABLET | Freq: Two times a day (BID) | ORAL | Status: AC

## 2011-02-07 MED ORDER — PHENAZOPYRIDINE HCL 200 MG PO TABS: 200.0000 mg | ORAL_TABLET | Freq: Three times a day (TID) | ORAL | Status: AC | PRN

## 2011-02-07 MED ORDER — ACETAMINOPHEN-CODEINE #3 300-30 MG PO TABS: 1.0000 | ORAL_TABLET | ORAL | Status: AC | PRN

## 2011-02-07 MED ORDER — ONDANSETRON 8 MG PO TBDP: 8.0000 mg | ORAL_TABLET | Freq: Three times a day (TID) | ORAL | Status: AC | PRN

## 2011-02-07 NOTE — ED Provider Notes (Signed)
History     CSN: [id number]  Arrival date & time 02/07/11  1510   First MD Initiated Contact with Patient 02/07/11 1606      Chief Complaint  Patient presents with  . Urinary Tract Infection  . Rectal Bleeding    (Consider location/radiation/quality/duration/timing/severity/associated sxs/prior treatment) HPI Comments: As he was seen at [facility] emergency room in December because of urinary tract infection. She was given Levaquin and did seem to feel somewhat better. Over the past week she's noted a recurrence of her symptoms including dysuria, frequency, and urgency. She denies any hematuria. She has had frequent urinary tract infections in the past. She denies fever or chills.  She also has a year-long history of GI complaints. She's been seen by her primary care physician for this in the past but no definite diagnosis was made. She has daily nausea and vomiting, sometimes with dark brown material which she thinks might be blood. She's also had alternating constipation, diarrhea, rectal pain, and hematochezia. She also notes a year-long history of pain in the left flank and left upper quadrant of the abdomen. Her abdomen feels bloated even that she's lost about 40 pounds.  She also notes aching in her hips, shoulders, and elbows.  Patient is a 26 y.o. female presenting with urinary tract infection and hematochezia.  Urinary Tract Infection Associated symptoms include abdominal pain. Pertinent negatives include no chest pain and no shortness of breath.  Rectal Bleeding  Associated symptoms include abdominal pain, diarrhea, nausea, rectal pain and vomiting. Pertinent negatives include no fever, no hematuria, no chest pain, no coughing and no rash.    Past Medical History  Diagnosis Date  . Kidney disease   . Hypertension     Past Surgical History  Procedure Date  . Leg surgery     History reviewed. No pertinent family history.  History  Substance Use Topics  . Smoking  status: Current Everyday Smoker  . Smokeless tobacco: Not on file  . Alcohol Use: Yes    OB History    Grav Para Term Preterm Abortions TAB SAB Ect Mult Living                  Review of Systems  Constitutional: Negative for fever, chills, appetite change and unexpected weight change.  Respiratory: Negative for cough, shortness of breath and wheezing.   Cardiovascular: Negative for chest pain.  Gastrointestinal: Positive for nausea, vomiting, abdominal pain, diarrhea, constipation, blood in stool, hematochezia, abdominal distention, anal bleeding and rectal pain.  Genitourinary: Positive for dysuria, urgency and frequency. Negative for hematuria and flank pain.  Musculoskeletal: Positive for arthralgias.  Skin: Negative for rash.    Allergies  Amoxicillin  Home Medications   Current Outpatient Rx  Name Route Sig Dispense Refill  . BUPROPION HCL ER (XL) 300 MG PO TB24 Oral Take 300 mg by mouth daily.      Marland Kitchen LORAZEPAM 1 MG PO TABS Oral Take 1 mg by mouth every 8 (eight) hours as needed. anxiety     . MEDROXYPROGESTERONE ACETATE 150 MG/ML IM SUSP Intramuscular Inject 150 mg into the muscle every 3 (three) months. Due next week(first week of December)     . ACETAMINOPHEN-CODEINE #3 300-30 MG PO TABS Oral Take 1-2 tablets by mouth every 4 (four) hours as needed for pain. 30 tablet 0  . DULOXETINE HCL 30 MG PO CPEP Oral Take 30 mg by mouth daily.      Marland Kitchen NAPROXEN SODIUM 220 MG PO TABS  Oral Take 440 mg by mouth as needed. Pain.     Marland Kitchen ONDANSETRON 8 MG PO TBDP Oral Take 1 tablet (8 mg total) by mouth every 8 (eight) hours as needed for nausea. 20 tablet 0  . PHENAZOPYRIDINE HCL 200 MG PO TABS Oral Take 1 tablet (200 mg total) by mouth 3 (three) times daily as needed for pain. 15 tablet 0  . HYDROCORTISONE ACE-PRAMOXINE 1-1 % RE CREA Rectal Place rectally 2 (two) times daily. 30 g 0  . SULFAMETHOXAZOLE-TMP DS 800-160 MG PO TABS Oral Take 1 tablet by mouth 2 (two) times daily. 20 tablet 0     BP 133/94  Pulse 84  Temp(Src) 98.5 F (36.9 C) (Oral)  Resp 16  SpO2 100%  Physical Exam  Nursing note and vitals reviewed. Constitutional: She appears well-developed and well-nourished. No distress.  Eyes: No scleral icterus.  Cardiovascular: Normal rate, regular rhythm and normal heart sounds.  Exam reveals no gallop and no friction rub.   No murmur heard. Pulmonary/Chest: Effort normal and breath sounds normal. No respiratory distress. She has no wheezes. She has no rales.  Abdominal: Soft. Bowel sounds are normal. She exhibits no distension and no mass. There is no hepatosplenomegaly. There is tenderness (she has mild tenderness to palpation in her left upper quadrant and left flank without guarding or rebound). There is no rebound, no guarding and no CVA tenderness.  Genitourinary:       Digital rectal exam reveals several a large hemorrhoidal tags. There was considerable pain and spasm but she was able to tolerate a rectal examination. No masses were felt. There was a small amount of stool on the exam glove which was strongly heme positive.  Skin: Skin is warm and dry. No rash noted. She is not diaphoretic.    ED Course  Procedures (including critical care time)  Results for orders placed during the hospital encounter of 02/07/11  POCT URINALYSIS DIP (DEVICE)      Component Value Range   Glucose, UA NEGATIVE  NEGATIVE (mg/dL)   Bilirubin Urine NEGATIVE  NEGATIVE    Ketones, ur NEGATIVE  NEGATIVE (mg/dL)   Specific Gravity, Urine 1.020  1.005 - 1.030    Hgb urine dipstick TRACE (*) NEGATIVE    pH 6.0  5.0 - 8.0    Protein, ur NEGATIVE  NEGATIVE (mg/dL)   Urobilinogen, UA 0.2  0.0 - 1.0 (mg/dL)   Nitrite NEGATIVE  NEGATIVE    Leukocytes, UA NEGATIVE  NEGATIVE   POCT PREGNANCY, URINE      Component Value Range   Preg Test, Ur NEGATIVE    OCCULT BLOOD, POC DEVICE      Component Value Range   Fecal Occult Bld POSITIVE    POCT I-STAT, CHEM 8      Component Value  Range   Sodium 144  135 - 145 (mEq/L)   Potassium 3.6  3.5 - 5.1 (mEq/L)   Chloride 105  96 - 112 (mEq/L)   BUN 5 (*) 6 - 23 (mg/dL)   Creatinine, Ser 1.61  0.50 - 1.10 (mg/dL)   Glucose, Bld 82  70 - 99 (mg/dL)   Calcium, Ion 0.96  0.45 - 1.32 (mmol/L)   TCO2 25  0 - 100 (mmol/L)   Hemoglobin 15.3 (*) 12.0 - 15.0 (g/dL)   HCT 40.9  81.1 - 91.4 (%)     Labs Reviewed  POCT URINALYSIS DIP (DEVICE) - Abnormal; Notable for the following:    Hgb urine dipstick TRACE (*)  All other components within normal limits  POCT I-STAT, CHEM 8 - Abnormal; Notable for the following:    BUN 5 (*)    Hemoglobin 15.3 (*)    All other components within normal limits  POCT PREGNANCY, URINE  OCCULT BLOOD, POC DEVICE  POCT URINALYSIS DIPSTICK  POCT PREGNANCY, URINE  URINE CULTURE  I-STAT, CHEM 8   No results found.   1. UTI (lower urinary tract infection)   2. Diarrhea   3. Hematochezia   4. LUQ pain       MDM  I think she has several problems, the first is a urinary tract infection which may be recurrent. We'll treat this with Septra since she hasn't been on this in the past and a urine culture was obtained. The second problem is a chronic history of GI problems including nausea, vomiting, abdominal and left flank pain, diarrhea, constipation, anal pain, and hematochezia. This may represent irritable bowel syndrome or inflammatory bowel disease. She needs a complete GI workup including upper and lower endoscopy. She was referred to Dr. Marina Goodell for this.        Roque Lias, MD 02/07/11 2041

## 2011-02-07 NOTE — ED Notes (Signed)
Pt states she was treated for UTI at [facility] last month.  States in the last week she has been experiencing urinary frequency, burning, urgency, voiding small amounts and flank pain.  Also states for several months she has been vomiting after solid foods, reports she usually retains approx. 1 meal per day.  Additionally she has been having changes in bowel habits and has seen bright red blood in her stools.  Reports before the sx started she lost approx 40 lbs.  She has seen her PCP for these sx but states they treated her for UTI only.

## 2011-02-07 NOTE — ED Notes (Signed)
Per pharmacy ([facility]): pt unable to afford anal cream and Zofran due to insurance non-coverage.  V.O. Dr. Lorenz Coaster: may substitute Anusol HC, 1 tube, apply to anus BID, no refills; promethazine 25 mg tabs, #30, 1 PO q 4 hrs prn nausea, no refills.  Pharmacy notified.

## 2011-02-20 ENCOUNTER — Encounter (HOSPITAL_COMMUNITY): Payer: Self-pay | Admitting: *Deleted

## 2011-02-20 ENCOUNTER — Emergency Department (HOSPITAL_COMMUNITY)
Admission: EM | Admit: 2011-02-20 | Discharge: 2011-02-21 | Disposition: A | Discharge status: Home / Self Care | Payer: Medicare Other | Attending: Emergency Medicine | Admitting: Emergency Medicine

## 2011-02-20 DIAGNOSIS — F319 Bipolar disorder, unspecified: Secondary | ICD-10-CM | POA: Insufficient documentation

## 2011-02-20 DIAGNOSIS — Z79899 Other long term (current) drug therapy: Secondary | ICD-10-CM | POA: Diagnosis not present

## 2011-02-20 DIAGNOSIS — F17200 Nicotine dependence, unspecified, uncomplicated: Secondary | ICD-10-CM | POA: Diagnosis not present

## 2011-02-20 DIAGNOSIS — F411 Generalized anxiety disorder: Secondary | ICD-10-CM | POA: Insufficient documentation

## 2011-02-20 DIAGNOSIS — F429 Obsessive-compulsive disorder, unspecified: Secondary | ICD-10-CM | POA: Insufficient documentation

## 2011-02-20 DIAGNOSIS — Z046 Encounter for general psychiatric examination, requested by authority: Secondary | ICD-10-CM | POA: Insufficient documentation

## 2011-02-20 DIAGNOSIS — I10 Essential (primary) hypertension: Secondary | ICD-10-CM | POA: Insufficient documentation

## 2011-02-20 HISTORY — DX: Anxiety disorder, unspecified: F41.9

## 2011-02-20 HISTORY — DX: Obsessive-compulsive disorder, unspecified: F42.9
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
## 2011-02-21 LAB — URINALYSIS, ROUTINE W REFLEX MICROSCOPIC
Bilirubin Urine: NEGATIVE
Glucose, UA: NEGATIVE mg/dL
Hgb urine dipstick: NEGATIVE
Nitrite: NEGATIVE
Specific Gravity, Urine: 1.025 (ref 1.005–1.030)
pH: 6.5 (ref 5.0–8.0)

## 2011-02-21 LAB — BASIC METABOLIC PANEL
Calcium: 9.2 mg/dL (ref 8.4–10.5)
GFR calc Af Amer: >90 mL/min (ref >90)
GFR calc non Af Amer: >90 mL/min (ref >90)
Sodium: 142 mEq/L (ref 135–145)

## 2011-02-21 LAB — RAPID URINE DRUG SCREEN, HOSP PERFORMED
Cocaine: NOT DETECTED
Opiates: POSITIVE — AB

## 2011-02-21 LAB — PREGNANCY, URINE: Preg Test, Ur: NEGATIVE

## 2011-02-21 LAB — CBC
MCHC: 35.3 g/dL (ref 30.0–36.0)
RDW: 13 % (ref 11.5–15.5)

## 2011-02-21 NOTE — ED Notes (Signed)
Pt's parents in department.  Pt requests to not speak with them.  Family reports pt has had very long history of drug and behavorial problems and they are very concerned that she will someday do something to inadvertently hurt herself or someone else. Denies pt specifically making threats to them at this time.  Per family, pt is currently in therapy, but they are unsure how compliant she is with treatment.  Pt does not reside with parents. Reinforced with parents that no personal information regarding pt's treatment can be discussed with them at this time.  Family left contact numbers Festus Holts (Mother)- [phone] or [phone].  Uri Covey (Father) [phone].

## 2011-02-21 NOTE — ED Notes (Signed)
IVC rescinded by specialist.  Pt discharged.
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
## 2011-02-21 NOTE — ED Notes (Signed)
Tele-psych consult completed.   

## 2011-02-21 NOTE — ED Notes (Signed)
Telepsych consult request faxed and computer in room.

## 2011-02-21 NOTE — ED Notes (Signed)
Telepsych consult began. 
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
## 2011-02-23 DIAGNOSIS — F0630 Mood disorder due to known physiological condition, unspecified: Secondary | ICD-10-CM | POA: Diagnosis not present

## 2011-02-23 DIAGNOSIS — F4001 Agoraphobia with panic disorder: Secondary | ICD-10-CM | POA: Diagnosis not present

## 2011-03-22 ENCOUNTER — Encounter (HOSPITAL_COMMUNITY): Payer: Self-pay | Admitting: *Deleted

## 2011-03-22 ENCOUNTER — Emergency Department (HOSPITAL_COMMUNITY): Payer: Medicare Other

## 2011-03-22 ENCOUNTER — Emergency Department (HOSPITAL_COMMUNITY)
Admission: EM | Admit: 2011-03-22 | Discharge: 2011-03-22 | Disposition: A | Discharge status: Home Health | Payer: Medicare Other | Attending: Emergency Medicine | Admitting: Emergency Medicine

## 2011-03-22 DIAGNOSIS — Z79899 Other long term (current) drug therapy: Secondary | ICD-10-CM | POA: Diagnosis not present

## 2011-03-22 DIAGNOSIS — I10 Essential (primary) hypertension: Secondary | ICD-10-CM | POA: Diagnosis not present

## 2011-03-22 DIAGNOSIS — F411 Generalized anxiety disorder: Secondary | ICD-10-CM | POA: Diagnosis not present

## 2011-03-22 DIAGNOSIS — M545 Low back pain, unspecified: Secondary | ICD-10-CM | POA: Diagnosis not present

## 2011-03-22 DIAGNOSIS — S335XXA Sprain of ligaments of lumbar spine, initial encounter: Secondary | ICD-10-CM | POA: Diagnosis not present

## 2011-03-22 DIAGNOSIS — X58XXXA Exposure to other specified factors, initial encounter: Secondary | ICD-10-CM | POA: Insufficient documentation

## 2011-03-22 DIAGNOSIS — S39012A Strain of muscle, fascia and tendon of lower back, initial encounter: Secondary | ICD-10-CM

## 2011-03-22 DIAGNOSIS — F17200 Nicotine dependence, unspecified, uncomplicated: Secondary | ICD-10-CM | POA: Insufficient documentation

## 2011-03-22 MED ORDER — OXYCODONE-ACETAMINOPHEN 5-325 MG PO TABS: 1.0000 | ORAL_TABLET | ORAL | Status: AC | PRN

## 2011-03-22 MED ORDER — IBUPROFEN 800 MG PO TABS: 800.0000 mg | ORAL_TABLET | Freq: Three times a day (TID) | ORAL | Status: AC

## 2011-03-22 MED ORDER — METHOCARBAMOL 500 MG PO TABS: 1000.0000 mg | ORAL_TABLET | Freq: Four times a day (QID) | ORAL | Status: AC

## 2011-03-22 MED ORDER — METHOCARBAMOL 500 MG PO TABS
1000.0000 mg | ORAL_TABLET | Freq: Once | ORAL | Status: AC
  Administered 2011-03-22: 1000 mg via ORAL
  Filled 2011-03-22: qty 2

## 2011-03-22 MED ORDER — HYDROMORPHONE HCL PF 1 MG/ML IJ SOLN
1.0000 mg | Freq: Once | INTRAMUSCULAR | Status: AC
  Administered 2011-03-22: 1 mg via INTRAMUSCULAR
  Filled 2011-03-22: qty 1

## 2011-03-22 NOTE — ED Notes (Signed)
Pt DC to home with steady gait 

## 2011-03-22 NOTE — ED Provider Notes (Signed)
History     CSN: [id number]  Arrival date & time 03/22/11  1916   First MD Initiated Contact with Patient 03/22/11 1929      Chief Complaint  Patient presents with  . Back Pain    (Consider location/radiation/quality/duration/timing/severity/associated sxs/prior treatment) Patient is a 26 y.o. female presenting with back pain. The history is provided by the patient.  Back Pain  This is a new problem. The current episode started yesterday. The problem occurs constantly. The problem has not changed since onset.The pain is associated with no known injury. The quality of the pain is described as stabbing and aching. The pain does not radiate. The pain is at a severity of 7/10. The pain is moderate. The symptoms are aggravated by bending, twisting and certain positions. The pain is worse during the day. Pertinent negatives include no chest pain, no fever, no numbness, no headaches, no abdominal pain, no abdominal swelling, no bowel incontinence, no perianal numbness, no bladder incontinence, no dysuria, no leg pain, no paresthesias, no paresis, no tingling and no weakness. She has tried heat and bed rest for the symptoms. The treatment provided no relief.    Past Medical History  Diagnosis Date  . Hypertension   . Kidney disease     "infection"  . Anxiety   . OCD (obsessive compulsive disorder)     Past Surgical History  Procedure Date  . Leg surgery     History reviewed. No pertinent family history.  History  Substance Use Topics  . Smoking status: Current Everyday Smoker -- 0.5 packs/day    Types: Cigarettes  . Smokeless tobacco: Not on file  . Alcohol Use: Yes     rarely    OB History    Grav Para Term Preterm Abortions TAB SAB Ect Mult Living                  Review of Systems  Constitutional: Negative for fever.  HENT: Negative for congestion, sore throat and neck pain.   Eyes: Negative.   Respiratory: Negative for chest tightness and shortness of breath.     Cardiovascular: Negative for chest pain.  Gastrointestinal: Negative for nausea, abdominal pain and bowel incontinence.  Genitourinary: Negative.  Negative for bladder incontinence and dysuria.  Musculoskeletal: Positive for back pain. Negative for joint swelling and arthralgias.  Skin: Negative.  Negative for rash and wound.  Neurological: Negative for dizziness, tingling, weakness, light-headedness, numbness, headaches and paresthesias.  Hematological: Negative.   Psychiatric/Behavioral: Negative.     Allergies  Amoxicillin  Home Medications   Current Outpatient Rx  Name Route Sig Dispense Refill  . BUPROPION HCL ER (XL) 300 MG PO TB24 Oral Take 300 mg by mouth daily.      Marland Kitchen HYDROCODONE-ACETAMINOPHEN 5-325 MG PO TABS Oral Take 1 tablet by mouth every 6 (six) hours as needed.    . IBUPROFEN 800 MG PO TABS Oral Take 1 tablet (800 mg total) by mouth 3 (three) times daily. 21 tablet 0  . LORAZEPAM 1 MG PO TABS Oral Take 1 mg by mouth every 8 (eight) hours as needed. anxiety     . MEDROXYPROGESTERONE ACETATE 150 MG/ML IM SUSP Intramuscular Inject 150 mg into the muscle every 3 (three) months. Due next week(first week of December)     . METHOCARBAMOL 500 MG PO TABS Oral Take 2 tablets (1,000 mg total) by mouth 4 (four) times daily. 40 tablet 0  . NAPROXEN SODIUM 220 MG PO TABS Oral Take 440 mg  by mouth as needed. Pain.     . OXYCODONE-ACETAMINOPHEN 5-325 MG PO TABS Oral Take 1 tablet by mouth every 4 (four) hours as needed for pain. 15 tablet 0    BP 143/101  Pulse 94  Temp(Src) 98.1 F (36.7 C) (Oral)  Resp 20  Ht 5\' 8"  (1.727 m)  Wt 190 lb (86.183 kg)  BMI 28.89 kg/m2  SpO2 98%  Physical Exam  Nursing note and vitals reviewed. Constitutional: She is oriented to person, place, and time. She appears well-developed and well-nourished.  HENT:  Head: Normocephalic.  Eyes: Conjunctivae are normal.  Neck: Normal range of motion. Neck supple.  Cardiovascular: Regular rhythm and  intact distal pulses.        Pedal pulses normal.  Pulmonary/Chest: Effort normal. She has no wheezes.  Abdominal: Soft. Bowel sounds are normal. She exhibits no distension and no mass.  Musculoskeletal: Normal range of motion. She exhibits no edema.       Lumbar back: She exhibits tenderness. She exhibits no swelling, no edema and no spasm.  Neurological: She is alert and oriented to person, place, and time. She has normal strength. She displays no atrophy and no tremor. No cranial nerve deficit or sensory deficit. Gait normal.  Reflex Scores:      Patellar reflexes are 2+ on the right side and 2+ on the left side.      Achilles reflexes are 2+ on the right side and 2+ on the left side.      No strength deficit noted in hip and knee flexor and extensor muscle groups.  Ankle flexion and extension intact.  Skin: Skin is warm and dry.  Psychiatric: She has a normal mood and affect.    ED Course  Procedures (including critical care time)   Labs Reviewed  POCT PREGNANCY, URINE   Dg Lumbar Spine Complete  03/22/2011  *RADIOLOGY REPORT*  Clinical Data: Low back pain.  LUMBAR SPINE - COMPLETE 4+ VIEW  Comparison: CT scan of the abdomen or pelvis dated 08/28/2010  Findings: There is no fracture, subluxation, disc space narrowing, facet arthritis, or other significant abnormality.  IMPRESSION: Normal lumbar spine.  Original Report Authenticated By: Gwynn Burly, M.D.     1. Lumbar strain       MDM  Percocet,  Ibuprofen,  Robaxin,  Heat,  Rest.  Referral to Dr. Nickola Major if not improved over the next week.  No neuro deficit on exam or by history to suggest emergent or surgical presentation.           Candis Musa, PA 03/22/11 2052

## 2011-03-22 NOTE — ED Provider Notes (Signed)
Medical screening examination/treatment/procedure(s) were performed by non-physician practitioner and as supervising physician I was immediately available for consultation/collaboration.   Benny Lennert, MD 03/22/11 [phone]

## 2011-03-22 NOTE — ED Notes (Signed)
Increased pain with movement, No known injury

## 2011-04-18 DIAGNOSIS — F0630 Mood disorder due to known physiological condition, unspecified: Secondary | ICD-10-CM | POA: Diagnosis not present

## 2011-04-26 ENCOUNTER — Emergency Department (HOSPITAL_COMMUNITY)
Admission: EM | Admit: 2011-04-26 | Discharge: 2011-04-27 | Disposition: A | Discharge status: Home / Self Care | Payer: Medicare Other | Attending: Emergency Medicine | Admitting: Emergency Medicine

## 2011-04-26 DIAGNOSIS — I10 Essential (primary) hypertension: Secondary | ICD-10-CM | POA: Insufficient documentation

## 2011-04-26 DIAGNOSIS — R079 Chest pain, unspecified: Secondary | ICD-10-CM | POA: Diagnosis not present

## 2011-04-26 DIAGNOSIS — R0789 Other chest pain: Secondary | ICD-10-CM | POA: Diagnosis not present

## 2011-04-26 DIAGNOSIS — R51 Headache: Secondary | ICD-10-CM | POA: Insufficient documentation

## 2011-04-26 DIAGNOSIS — Z79899 Other long term (current) drug therapy: Secondary | ICD-10-CM | POA: Insufficient documentation

## 2011-04-26 DIAGNOSIS — F17200 Nicotine dependence, unspecified, uncomplicated: Secondary | ICD-10-CM | POA: Diagnosis not present

## 2011-04-26 NOTE — ED Provider Notes (Signed)
History     CSN: [id number]  Arrival date & time 04/26/11  2113   First MD Initiated Contact with Patient 04/26/11 2300      Chief Complaint  Patient presents with  . Chest Pain  . Headache    (Consider location/radiation/quality/duration/timing/severity/associated sxs/prior treatment) HPI Comments: Stabbing pain this AM below L scapula and now in L breast area.  No radiation.  No n/v, diaphoresis, SOB or presyncopal sxs.  She also  Has had a headache for the past 2 days.  She has a h/o MHA but this particular one hurts all over her face rather than one sided as usual.  She is concerned b/c of FHx of CVA's  Aneurysms.  She had a head CT some yrs back which she says was normal.  She does not want to get a head CT tonight.  "i am more worried about my chest pain.  If the headache continues i will see my MD and get the CT".  Patient is a 26 y.o. female presenting with chest pain and headaches. The history is provided by the patient. No language interpreter was used.  Chest Pain Episode onset: onset this AM. Chest pain occurs constantly. The chest pain is unchanged. At its most intense, the pain is at 8/10. The quality of the pain is described as pressure-like. The pain does not radiate. Pertinent negatives for primary symptoms include no fever, no syncope, no shortness of breath, no cough, no wheezing, no palpitations and no abdominal pain. She tried nothing for the symptoms. Risk factors include no known risk factors.  Pertinent negatives for past medical history include no seizures.  Her family medical history is significant for heart disease in family, hypertension in family and stroke in family.  Procedure history is negative for cardiac catheterization, echocardiogram, persantine thallium, stress echo, stress thallium and exercise treadmill test.    Headache  Pertinent negatives include no fever, no palpitations, no syncope and no shortness of breath.    Past Medical History    Diagnosis Date  . Hypertension   . Kidney disease     "infection"  . Anxiety   . OCD (obsessive compulsive disorder)     Past Surgical History  Procedure Date  . Leg surgery     No family history on file.  History  Substance Use Topics  . Smoking status: Current Everyday Smoker -- 0.5 packs/day    Types: Cigarettes  . Smokeless tobacco: Not on file  . Alcohol Use: Yes     rarely    OB History    Grav Para Term Preterm Abortions TAB SAB Ect Mult Living                  Review of Systems  Constitutional: Negative for fever.  Respiratory: Negative for cough, shortness of breath and wheezing.   Cardiovascular: Positive for chest pain. Negative for palpitations and syncope.  Gastrointestinal: Negative for abdominal pain.  Neurological: Positive for headaches. Negative for seizures, facial asymmetry and speech difficulty.  All other systems reviewed and are negative.    Allergies  Amoxicillin  Home Medications   Current Outpatient Rx  Name Route Sig Dispense Refill  . BUPROPION HCL ER (XL) 300 MG PO TB24 Oral Take 300 mg by mouth daily.      Marland Kitchen FLUOXETINE HCL 20 MG PO CAPS Oral Take 20 mg by mouth daily.    . IBUPROFEN 200 MG PO TABS Oral Take 400 mg by mouth as needed. FOR PAIN    .  MEDROXYPROGESTERONE ACETATE 150 MG/ML IM SUSP Intramuscular Inject 150 mg into the muscle every 3 (three) months. Due next week(first week of December)     . NAPROXEN SODIUM 220 MG PO TABS Oral Take 440 mg by mouth as needed. Pain.     . QUETIAPINE FUMARATE 25 MG PO TABS Oral Take 25 mg by mouth daily as needed. For panic/anxiety      BP 131/85  Pulse 78  Temp(Src) 97.9 F (36.6 C) (Oral)  Resp 14  Ht 5\' 8"  (1.727 m)  Wt 200 lb (90.719 kg)  BMI 30.41 kg/m2  SpO2 99%  Physical Exam  Nursing note and vitals reviewed. Constitutional: She is oriented to person, place, and time. She appears well-developed and well-nourished. No distress.  HENT:  Head: Normocephalic and  atraumatic.  Eyes: EOM are normal.  Neck: Normal range of motion.  Cardiovascular: Normal rate, regular rhythm and normal heart sounds.   Pulmonary/Chest: Effort normal and breath sounds normal. No respiratory distress. She has no decreased breath sounds. She has no wheezes. She has no rhonchi. She has no rales. She exhibits no tenderness.    Abdominal: Soft. She exhibits no distension. There is no tenderness.  Musculoskeletal: Normal range of motion.  Neurological: She is alert and oriented to person, place, and time. No cranial nerve deficit. Coordination normal.  Skin: Skin is warm and dry.  Psychiatric: She has a normal mood and affect. Her behavior is normal. Judgment and thought content normal.    ED Course  Procedures (including critical care time)  Labs Reviewed - No data to display No results found.   No diagnosis found.   Date: 04/27/2011  Rate: 87  Rhythm: normal sinus rhythm  QRS Axis: normal  Intervals: PR shortened  ST/T Wave abnormalities: normal  Conduction Disutrbances:none  Narrative Interpretation:   Old EKG Reviewed: unchanged    MDM  Dr. Colon Branch assumed care and will d/c pt.        Worthy Rancher, PA 04/27/11 [phone]

## 2011-04-26 NOTE — ED Notes (Signed)
C/o CP since last night, SOB with movement, nausea earlier but denies at this time and cough

## 2011-04-27 ENCOUNTER — Emergency Department (HOSPITAL_COMMUNITY): Payer: Medicare Other

## 2011-04-27 DIAGNOSIS — I10 Essential (primary) hypertension: Secondary | ICD-10-CM | POA: Diagnosis not present

## 2011-04-27 DIAGNOSIS — R079 Chest pain, unspecified: Secondary | ICD-10-CM | POA: Diagnosis not present

## 2011-04-27 DIAGNOSIS — F17200 Nicotine dependence, unspecified, uncomplicated: Secondary | ICD-10-CM | POA: Diagnosis not present

## 2011-04-27 MED ORDER — IBUPROFEN 800 MG PO TABS
800.0000 mg | ORAL_TABLET | Freq: Once | ORAL | Status: AC
  Administered 2011-04-27: 800 mg via ORAL
  Filled 2011-04-27: qty 1

## 2011-04-27 MED ORDER — KETOROLAC TROMETHAMINE 60 MG/2ML IM SOLN: 60.0000 mg | Freq: Once | INTRAMUSCULAR | Status: DC

## 2011-04-27 MED ORDER — HYDROMORPHONE HCL PF 1 MG/ML IJ SOLN: 1.0000 mg | Freq: Once | INTRAMUSCULAR | Status: DC

## 2011-04-27 MED ORDER — ONDANSETRON 4 MG PO TBDP
4.0000 mg | ORAL_TABLET | Freq: Once | ORAL | Status: AC
  Administered 2011-04-27: 4 mg via ORAL
  Filled 2011-04-27: qty 1

## 2011-04-27 MED ORDER — HYDROCODONE-ACETAMINOPHEN 5-325 MG PO TABS: 1.0000 | ORAL_TABLET | ORAL | Status: AC | PRN

## 2011-04-27 MED ORDER — HYDROCODONE-ACETAMINOPHEN 5-325 MG PO TABS
1.0000 | ORAL_TABLET | Freq: Once | ORAL | Status: AC
  Administered 2011-04-27: 1 via ORAL
  Filled 2011-04-27: qty 1

## 2011-04-27 NOTE — Progress Notes (Signed)
Called to patient room by patient visitor. She did not feel patient was being attended to by either the nurse or the PA. Patient is here with pain to her abdomen and chest that radiates to her shoulder. She feels she should have had anaglesics and testing done. She is wanting to leave the ER.  I asked that she stay and allow me to care for her. She agreed. Ordered analgesic and chest xray. Reviewed EKG.   Date: 04/26/2011  2122  Rate: 87  Rhythm: normal sinus rhythm and with shourt PR interval  QRS Axis: normal  Intervals: PR shortened  ST/T Wave abnormalities: normal  Conduction Disutrbances:none  Narrative Interpretation:   Old EKG Reviewed: none available  Reviewed results with patient. She is satisfied that she has been evaluated appropriately. Apologized for the long delay in her care this evening. Discharged patient personally.

## 2011-04-27 NOTE — ED Notes (Addendum)
Denies any nausea at this time.  Skin warm and dry.  Color good   Monitor shows NSR without ectopy

## 2011-04-27 NOTE — ED Provider Notes (Signed)
Medical screening examination/treatment/procedure(s) were conducted as a shared visit with non-physician practitioner(s) and myself.  I personally evaluated the patient during the encounter  [name]. Colon Branch, MD 04/27/11 [phone]

## 2011-04-27 NOTE — Discharge Instructions (Signed)
Your EKG and chest xray were normal here tonight. The most likely cause of your pain is chest wall pain. Apply heat for comfort. Use your antiinflammatory and the stronger pain medicine as needed. Follow up with your doctor.   Chest Pain (Nonspecific) Chest pain has many causes. Your pain could be caused by something serious, such as a heart attack or a blood clot in the lungs. It could also be caused by something less serious, such as a chest bruise or a virus. Follow up with your doctor. More lab tests or other studies may be needed to find the cause of your pain. Most of the time, nonspecific chest pain will improve within 2 to 3 days of rest and mild pain medicine. HOME CARE  For chest bruises, you may put ice on the sore area for 15 to 20 minutes, 3 to 4 times a day. Do this only if it makes you or your child feel better.   Put ice in a plastic bag.   Place a towel between the skin and the bag.   Rest for the next 2 to 3 days.   Go back to work if the pain improves.   See your doctor if the pain lasts longer than 1 to 2 weeks.   Only take medicine as told by your doctor.   Quit smoking if you smoke.  GET HELP RIGHT AWAY IF:   There is more pain or pain that spreads to the arm, neck, jaw, back, or belly (abdomen).   You or your child has shortness of breath.   You or your child coughs more than usual or coughs up blood.   You or your child has very bad back or belly pain, feels sick to his or her stomach (nauseous), or throws up (vomits).   You or your child has very bad weakness.   You or your child passes out (faints).   You or your child has a temperature by mouth above 102 F (38.9 C), not controlled by medicine.  Any of these problems may be serious and may be an emergency. Do not wait to see if the problems will go away. Get medical help right away. Call your local emergency services 911 in [place] Do not drive yourself to the hospital. MAKE SURE YOU:   Understand  these instructions.   Will watch this condition.   Will get help right away if you or your child is not doing well or gets worse.  Document Released: 06/22/2007 Document Revised: 12/23/2010 Document Reviewed: 06/22/2007 [facility] Patient Information 2012 [place], [place].

## 2011-05-01 ENCOUNTER — Emergency Department (HOSPITAL_COMMUNITY): Payer: Medicare Other

## 2011-05-01 ENCOUNTER — Encounter (HOSPITAL_COMMUNITY): Payer: Self-pay

## 2011-05-01 ENCOUNTER — Emergency Department (HOSPITAL_COMMUNITY)
Admission: EM | Admit: 2011-05-01 | Discharge: 2011-05-01 | Disposition: A | Discharge status: Home / Self Care | Payer: Medicare Other | Attending: Emergency Medicine | Admitting: Emergency Medicine

## 2011-05-01 DIAGNOSIS — R5383 Other fatigue: Secondary | ICD-10-CM | POA: Insufficient documentation

## 2011-05-01 DIAGNOSIS — R509 Fever, unspecified: Secondary | ICD-10-CM | POA: Diagnosis not present

## 2011-05-01 DIAGNOSIS — I10 Essential (primary) hypertension: Secondary | ICD-10-CM | POA: Diagnosis not present

## 2011-05-01 DIAGNOSIS — F17200 Nicotine dependence, unspecified, uncomplicated: Secondary | ICD-10-CM | POA: Diagnosis not present

## 2011-05-01 DIAGNOSIS — R5381 Other malaise: Secondary | ICD-10-CM | POA: Diagnosis not present

## 2011-05-01 DIAGNOSIS — J029 Acute pharyngitis, unspecified: Secondary | ICD-10-CM | POA: Diagnosis not present

## 2011-05-01 DIAGNOSIS — J329 Chronic sinusitis, unspecified: Secondary | ICD-10-CM | POA: Diagnosis not present

## 2011-05-01 DIAGNOSIS — J3489 Other specified disorders of nose and nasal sinuses: Secondary | ICD-10-CM | POA: Diagnosis not present

## 2011-05-01 DIAGNOSIS — R070 Pain in throat: Secondary | ICD-10-CM | POA: Diagnosis not present

## 2011-05-01 DIAGNOSIS — J019 Acute sinusitis, unspecified: Secondary | ICD-10-CM | POA: Diagnosis not present

## 2011-05-01 DIAGNOSIS — M542 Cervicalgia: Secondary | ICD-10-CM | POA: Diagnosis not present

## 2011-05-01 LAB — RAPID STREP SCREEN (MED CTR MEBANE ONLY): Streptococcus, Group A Screen (Direct): NEGATIVE

## 2011-05-01 MED ORDER — PSEUDOEPHEDRINE HCL 60 MG PO TABS: 60.0000 mg | ORAL_TABLET | Freq: Four times a day (QID) | ORAL | Status: AC | PRN

## 2011-05-01 MED ORDER — CLINDAMYCIN HCL 150 MG PO CAPS: 150.0000 mg | ORAL_CAPSULE | Freq: Four times a day (QID) | ORAL | Status: AC

## 2011-05-01 MED ORDER — IBUPROFEN 600 MG PO TABS: 600.0000 mg | ORAL_TABLET | Freq: Four times a day (QID) | ORAL | Status: AC | PRN

## 2011-05-01 NOTE — ED Notes (Signed)
Pt presents with sore throat, fever, and facial pain since Friday. Pt states when she went to clear sinuses yesterday a "chunk" of skin came out. Pt reports swelling in glands to bilateral neck.

## 2011-05-01 NOTE — ED Notes (Signed)
[name] at bedside

## 2011-05-01 NOTE — Discharge Instructions (Signed)

## 2011-05-02 NOTE — ED Provider Notes (Signed)
History     CSN: [id number]  Arrival date & time 05/01/11  1142   First MD Initiated Contact with Patient 05/01/11 1201      Chief Complaint  Patient presents with  . Sore Throat  . Fever  . Facial Pain    (Consider location/radiation/quality/duration/timing/severity/associated sxs/prior treatment) HPI Comments: [name] presents with a 2 day history of sore throat,  Facial pain with thick purulent postnasal drip.  She describes a large chunk of "tissue" which she cleared from her throat yesterday which she describes as being skin colored and had blood vessels running through it. She did not bring this specimen with her today.   Patient is a 26 y.o. female presenting with pharyngitis and fever. The history is provided by the patient and a parent.  Sore Throat This is a new problem. The problem occurs constantly. The problem has been unchanged. Associated symptoms include congestion, fatigue, a fever, a sore throat and swollen glands. Pertinent negatives include no abdominal pain, arthralgias, chest pain, diaphoresis, headaches, joint swelling, nausea, neck pain, numbness, rash, vomiting or weakness. The symptoms are aggravated by swallowing. She has tried NSAIDs for the symptoms. The treatment provided no relief.  Fever Primary symptoms of the febrile illness include fever and fatigue. Primary symptoms do not include headaches, shortness of breath, abdominal pain, nausea, vomiting, arthralgias or rash.    Past Medical History  Diagnosis Date  . Hypertension   . Kidney disease     "infection"  . Anxiety   . OCD (obsessive compulsive disorder)     Past Surgical History  Procedure Date  . Leg surgery     No family history on file.  History  Substance Use Topics  . Smoking status: Current Everyday Smoker -- 0.5 packs/day    Types: Cigarettes  . Smokeless tobacco: Not on file  . Alcohol Use: Yes     rarely    OB History    Grav Para Term Preterm Abortions TAB  SAB Ect Mult Living                  Review of Systems  Constitutional: Positive for fever and fatigue. Negative for diaphoresis.  HENT: Positive for congestion, sore throat and sinus pressure. Negative for neck pain.   Eyes: Negative.   Respiratory: Negative for chest tightness and shortness of breath.   Cardiovascular: Negative for chest pain.  Gastrointestinal: Negative for nausea, vomiting and abdominal pain.  Genitourinary: Negative.   Musculoskeletal: Negative for joint swelling and arthralgias.  Skin: Negative.  Negative for rash and wound.  Neurological: Negative for dizziness, weakness, light-headedness, numbness and headaches.  Hematological: Negative.   Psychiatric/Behavioral: Negative.     Allergies  Amoxicillin  Home Medications   Current Outpatient Rx  Name Route Sig Dispense Refill  . BUPROPION HCL ER (XL) 300 MG PO TB24 Oral Take 300 mg by mouth daily.      Marland Kitchen CLINDAMYCIN HCL 150 MG PO CAPS Oral Take 1 capsule (150 mg total) by mouth every 6 (six) hours. 28 capsule 0  . FLUOXETINE HCL 20 MG PO CAPS Oral Take 20 mg by mouth daily.    Marland Kitchen HYDROCODONE-ACETAMINOPHEN 5-325 MG PO TABS Oral Take 1 tablet by mouth every 4 (four) hours as needed for pain. 15 tablet 0  . IBUPROFEN 200 MG PO TABS Oral Take 400 mg by mouth as needed. FOR PAIN    . IBUPROFEN 600 MG PO TABS Oral Take 1 tablet (600 mg total) by  mouth every 6 (six) hours as needed for pain. 30 tablet 0  . MEDROXYPROGESTERONE ACETATE 150 MG/ML IM SUSP Intramuscular Inject 150 mg into the muscle every 3 (three) months. Due next week(first week of December)     . NAPROXEN SODIUM 220 MG PO TABS Oral Take 440 mg by mouth as needed. Pain.     Marland Kitchen PSEUDOEPHEDRINE HCL 60 MG PO TABS Oral Take 1 tablet (60 mg total) by mouth every 6 (six) hours as needed for congestion. 30 tablet 0  . QUETIAPINE FUMARATE 25 MG PO TABS Oral Take 25 mg by mouth daily as needed. For panic/anxiety      BP 137/93  Pulse 109  Temp(Src) 97.9 F  (36.6 C) (Oral)  Resp 18  Ht 5\' 8"  (1.727 m)  Wt 190 lb (86.183 kg)  BMI 28.89 kg/m2  SpO2 100%  Physical Exam  Nursing note and vitals reviewed. Constitutional: She is oriented to person, place, and time. She appears well-developed and well-nourished.  HENT:  Head: Normocephalic and atraumatic.  Right Ear: Tympanic membrane and external ear normal.  Left Ear: Tympanic membrane and external ear normal.  Nose: Mucosal edema present. No rhinorrhea. Right sinus exhibits maxillary sinus tenderness. Left sinus exhibits maxillary sinus tenderness.  Mouth/Throat: Uvula is midline and mucous membranes are normal. Posterior oropharyngeal erythema present. No oropharyngeal exudate, posterior oropharyngeal edema or tonsillar abscesses.  Eyes: Conjunctivae are normal.  Neck: Normal range of motion.  Cardiovascular: Normal rate, regular rhythm, normal heart sounds and intact distal pulses.   Pulmonary/Chest: Effort normal and breath sounds normal. She has no wheezes.  Abdominal: Soft. Bowel sounds are normal. There is no tenderness.  Musculoskeletal: Normal range of motion.  Neurological: She is alert and oriented to person, place, and time.  Skin: Skin is warm and dry.  Psychiatric: She has a normal mood and affect.    ED Course  Procedures (including critical care time)   Labs Reviewed  RAPID STREP SCREEN  LAB REPORT - SCANNED   Ct Maxillofacial Wo Cm  05/01/2011  *RADIOLOGY REPORT*  Clinical Data: Sinus and drainage.  Sore throat.  Fever and neck pain.  CT MAXILLOFACIAL WITHOUT CONTRAST  Technique:  Multidetector CT imaging of the maxillofacial structures was performed. Multiplanar CT image reconstructions were also generated.  Comparison: MRI brain the 07/07/2009.  Findings: Minimal mucosal thickening is present in the left maxillary and sphenoid sinuses.  There is minimal mucosal thickening anteriorly in the right sphenoid sinus.  The paranasal sinuses are otherwise clear.  Slight  leftward nasal septal deviation is noted.  The globes orbits are intact.  Limited imaging of the brain is unremarkable.  The mandible is intact and located.  The soft tissues are unremarkable.  There is some prominence of the adenoid tissue, more prominent on the left than right.  The palatine tonsils appear to be within normal limits.  The visualized salivary glands are within normal limits.  Level II lymph nodes appear reactive.  IMPRESSION:  1.  Mild mucosal thickening within the left maxillary sinus and bilateral sphenoid sinuses. 2.  There are no significant fluid levels. 3.  No other acute or focal abnormalities to explain the patient's symptoms.  Original Report Authenticated By: Jamesetta Orleans. MATTERN, M.D.     1. Sinusitis       MDM  Given pt's sx,  Will tx as acute sinusitis with clindamycin.  Ibuprofen and pseudoephedrine prescribed.  No explanation from Ct scan results for possible source of possible "tissue"  Sloughing from sinus/posterior pharyngeal space.  Referral to ENT prn.        Candis Musa, PA 05/02/11 2249

## 2011-05-02 NOTE — ED Provider Notes (Signed)
Medical screening examination/treatment/procedure(s) were performed by non-physician practitioner and as supervising physician I was immediately available for consultation/collaboration.   Glynn Octave, MD 05/02/11 [phone]

## 2011-06-27 ENCOUNTER — Emergency Department (HOSPITAL_COMMUNITY): Payer: Medicare Other

## 2011-06-27 ENCOUNTER — Emergency Department (HOSPITAL_COMMUNITY)
Admission: EM | Admit: 2011-06-27 | Discharge: 2011-06-27 | Disposition: A | Discharge status: Home / Self Care | Payer: Medicare Other | Attending: Emergency Medicine | Admitting: Emergency Medicine

## 2011-06-27 ENCOUNTER — Encounter (HOSPITAL_COMMUNITY): Payer: Self-pay | Admitting: *Deleted

## 2011-06-27 DIAGNOSIS — M1710 Unilateral primary osteoarthritis, unspecified knee: Secondary | ICD-10-CM | POA: Insufficient documentation

## 2011-06-27 DIAGNOSIS — F17200 Nicotine dependence, unspecified, uncomplicated: Secondary | ICD-10-CM | POA: Insufficient documentation

## 2011-06-27 DIAGNOSIS — I10 Essential (primary) hypertension: Secondary | ICD-10-CM | POA: Insufficient documentation

## 2011-06-27 DIAGNOSIS — F429 Obsessive-compulsive disorder, unspecified: Secondary | ICD-10-CM | POA: Diagnosis not present

## 2011-06-27 DIAGNOSIS — IMO0002 Reserved for concepts with insufficient information to code with codable children: Secondary | ICD-10-CM | POA: Diagnosis not present

## 2011-06-27 DIAGNOSIS — M1990 Unspecified osteoarthritis, unspecified site: Secondary | ICD-10-CM

## 2011-06-27 DIAGNOSIS — M25561 Pain in right knee: Secondary | ICD-10-CM

## 2011-06-27 MED ORDER — OXYCODONE-ACETAMINOPHEN 5-325 MG PO TABS
2.0000 | ORAL_TABLET | Freq: Once | ORAL | Status: AC
  Administered 2011-06-27: 2 via ORAL
  Filled 2011-06-27: qty 2

## 2011-06-27 MED ORDER — IBUPROFEN 600 MG PO TABS: 600.0000 mg | ORAL_TABLET | Freq: Four times a day (QID) | ORAL | Status: AC | PRN

## 2011-06-27 MED ORDER — OXYCODONE-ACETAMINOPHEN 5-325 MG PO TABS: 1.0000 | ORAL_TABLET | ORAL | Status: AC | PRN

## 2011-06-27 NOTE — Discharge Instructions (Signed)
Degenerative Arthritis You have osteoarthritis. This is the wear and tear arthritis that comes with aging. It is also called degenerative arthritis. This is common in people past middle age. It is caused by stress on the joints. The large weight bearing joints of the lower extremities are most often affected. The knees, hips, back, neck, and hands can become painful, swollen, and stiff. This is the most common type of arthritis. It comes on with age, carrying too much weight, or from an injury. Treatment includes resting the sore joint until the pain and swelling improve. Crutches or a walker may be needed for severe flares. Only take over-the-counter or prescription medicines for pain, discomfort, or fever as directed by your caregiver. Local heat therapy may improve motion. Cortisone shots into the joint are sometimes used to reduce pain and swelling during flares. Osteoarthritis is usually not crippling and progresses slowly. There are things you can do to decrease pain:  Avoid high impact activities.   Exercise regularly.   Low impact exercises such as walking, biking and swimming help to keep the muscles strong and keep normal joint function.   Stretching helps to keep your range of motion.   Lose weight if you are overweight. This reduces joint stress.  In severe cases when you have pain at rest or increasing disability, joint surgery may be helpful. See your caregiver for follow-up treatment as recommended.  SEEK IMMEDIATE MEDICAL CARE IF:   You have severe joint pain.   Marked swelling and redness in your joint develops.   You develop a high fever.  Document Released: 01/03/2005 Document Revised: 12/23/2010 Document Reviewed: 06/05/2006 [facility] Patient Information 2012 [place], [place].   You may take the oxycodone prescribed for pain relief.  This will make you drowsy - do not drive within 4 hours of taking this medication.

## 2011-06-27 NOTE — ED Notes (Signed)
Discharge instructions reviewed with pt, questions answered. Pt verbalized understanding.  
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
## 2011-07-05 NOTE — ED Provider Notes (Signed)
Medical screening examination/treatment/procedure(s) were performed by non-physician practitioner and as supervising physician I was immediately available for consultation/collaboration. Kailena Lubas, MD, FACEP   Annelle Behrendt L Sanaia Jasso, MD 07/05/11 1305 

## 2011-07-10 ENCOUNTER — Emergency Department (HOSPITAL_COMMUNITY): Payer: Medicare Other

## 2011-07-10 ENCOUNTER — Encounter (HOSPITAL_COMMUNITY): Payer: Self-pay | Admitting: Emergency Medicine

## 2011-07-10 ENCOUNTER — Emergency Department (HOSPITAL_COMMUNITY)
Admission: EM | Admit: 2011-07-10 | Discharge: 2011-07-11 | Disposition: A | Discharge status: Home / Self Care | Payer: Medicare Other | Attending: Emergency Medicine | Admitting: Emergency Medicine

## 2011-07-10 DIAGNOSIS — S161XXA Strain of muscle, fascia and tendon at neck level, initial encounter: Secondary | ICD-10-CM

## 2011-07-10 DIAGNOSIS — T07XXXA Unspecified multiple injuries, initial encounter: Secondary | ICD-10-CM | POA: Diagnosis not present

## 2011-07-10 DIAGNOSIS — R51 Headache: Secondary | ICD-10-CM | POA: Diagnosis not present

## 2011-07-10 DIAGNOSIS — F411 Generalized anxiety disorder: Secondary | ICD-10-CM | POA: Insufficient documentation

## 2011-07-10 DIAGNOSIS — R079 Chest pain, unspecified: Secondary | ICD-10-CM | POA: Insufficient documentation

## 2011-07-10 DIAGNOSIS — I10 Essential (primary) hypertension: Secondary | ICD-10-CM | POA: Diagnosis not present

## 2011-07-10 DIAGNOSIS — S139XXA Sprain of joints and ligaments of unspecified parts of neck, initial encounter: Secondary | ICD-10-CM | POA: Diagnosis not present

## 2011-07-10 DIAGNOSIS — M542 Cervicalgia: Secondary | ICD-10-CM | POA: Insufficient documentation

## 2011-07-10 DIAGNOSIS — S0003XA Contusion of scalp, initial encounter: Secondary | ICD-10-CM | POA: Diagnosis not present

## 2011-07-10 DIAGNOSIS — M25519 Pain in unspecified shoulder: Secondary | ICD-10-CM | POA: Insufficient documentation

## 2011-07-10 DIAGNOSIS — Z043 Encounter for examination and observation following other accident: Secondary | ICD-10-CM | POA: Diagnosis not present

## 2011-07-10 DIAGNOSIS — S0083XA Contusion of other part of head, initial encounter: Secondary | ICD-10-CM | POA: Insufficient documentation

## 2011-07-10 MED ORDER — KETOROLAC TROMETHAMINE 10 MG PO TABS
10.0000 mg | ORAL_TABLET | Freq: Once | ORAL | Status: AC
  Administered 2011-07-10: 10 mg via ORAL
  Filled 2011-07-10: qty 1

## 2011-07-10 MED ORDER — ONDANSETRON 4 MG PO TBDP
4.0000 mg | ORAL_TABLET | Freq: Once | ORAL | Status: AC
  Administered 2011-07-10: 4 mg via ORAL
  Filled 2011-07-10: qty 1

## 2011-07-10 MED ORDER — DIAZEPAM 5 MG PO TABS
5.0000 mg | ORAL_TABLET | Freq: Once | ORAL | Status: AC
  Administered 2011-07-10: 5 mg via ORAL
  Filled 2011-07-10: qty 1

## 2011-07-10 MED ORDER — HYDROCODONE-ACETAMINOPHEN 5-325 MG PO TABS
2.0000 | ORAL_TABLET | Freq: Once | ORAL | Status: AC
  Administered 2011-07-10: 2 via ORAL
  Filled 2011-07-10: qty 2
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
## 2011-07-11 MED ORDER — PROMETHAZINE HCL 25 MG PO TABS: 25.0000 mg | ORAL_TABLET | Freq: Four times a day (QID) | ORAL | Status: DC | PRN

## 2011-07-11 MED ORDER — DIAZEPAM 5 MG PO TABS: ORAL_TABLET | ORAL | Status: DC

## 2011-07-11 MED ORDER — MELOXICAM 7.5 MG PO TABS: ORAL_TABLET | ORAL | Status: DC

## 2011-07-11 MED ORDER — HYDROCODONE-ACETAMINOPHEN 7.5-325 MG PO TABS: 1.0000 | ORAL_TABLET | ORAL | Status: AC | PRN

## 2011-07-11 NOTE — ED Provider Notes (Signed)
Medical screening examination/treatment/procedure(s) were performed by non-physician practitioner and as supervising physician I was immediately available for consultation/collaboration.  Jonne Rote S. Sidi Dzikowski, MD 07/11/11 0318 

## 2011-07-11 NOTE — Discharge Instructions (Signed)
YOur xrays are negative for acute problem. Please use medications as suggested. See your primary MD for follow up in the office in 5 to 7 days. Valium and Norco may cause drowsiness, use with caution.Cervical Sprain A cervical sprain is an injury in the neck in which the ligaments are stretched or torn. The ligaments are the tissues that hold the bones of the neck (vertebrae) in place.Cervical sprains can range from very mild to very severe. Most cervical sprains get better in 1 to 3 weeks, but it depends on the cause and extent of the injury. Severe cervical sprains can cause the neck vertebrae to be unstable. This can lead to damage of the spinal cord and can result in serious nervous system problems. Your caregiver will determine whether your cervical sprain is mild or severe. CAUSES  Severe cervical sprains may be caused by:  Contact sport injuries (football, rugby, wrestling, hockey, auto racing, gymnastics, diving, martial arts, boxing).   Motor vehicle collisions.   Whiplash injuries. This means the neck is forcefully whipped backward and forward.   Falls.  Mild cervical sprains may be caused by:   Awkward positions, such as cradling a telephone between your ear and shoulder.   Sitting in a chair that does not offer proper support.   Working at a poorly Marketing executive station.   Activities that require looking up or down for long periods of time.  SYMPTOMS   Pain, soreness, stiffness, or a burning sensation in the front, back, or sides of the neck. This discomfort may develop immediately after injury or it may develop slowly and not begin for 24 hours or more after an injury.   Pain or tenderness directly in the middle of the back of the neck.   Shoulder or upper back pain.   Limited ability to move the neck.   Headache.   Dizziness.   Weakness, numbness, or tingling in the hands or arms.   Muscle spasms.   Difficulty swallowing or chewing.   Tenderness and  swelling of the neck.  DIAGNOSIS  Most of the time, your caregiver can diagnose this problem by taking your history and doing a physical exam. Your caregiver will ask about any known problems, such as arthritis in the neck or a previous neck injury. X-rays may be taken to find out if there are any other problems, such as problems with the bones of the neck. However, an X-ray often does not reveal the full extent of a cervical sprain. Other tests such as a computed tomography (CT) scan or magnetic resonance imaging (MRI) may be needed. TREATMENT  Treatment depends on the severity of the cervical sprain. Mild sprains can be treated with rest, keeping the neck in place (immobilization), and pain medicines. Severe cervical sprains need immediate immobilization and an appointment with an orthopedist or neurosurgeon. Several treatment options are available to help with pain, muscle spasms, and other symptoms. Your caregiver may prescribe:  Medicines, such as pain relievers, numbing medicines, or muscle relaxants.   Physical therapy. This can include stretching exercises, strengthening exercises, and posture training. Exercises and improved posture can help stabilize the neck, strengthen muscles, and help stop symptoms from returning.   A neck collar to be worn for short periods of time. Often, these collars are worn for comfort. However, certain collars may be worn to protect the neck and prevent further worsening of a serious cervical sprain.  HOME CARE INSTRUCTIONS   Put ice on the injured area.   Put  ice in a plastic bag.   Place a towel between your skin and the bag.   Leave the ice on for 15 to 20 minutes, 3 to 4 times a day.   Only take over-the-counter or prescription medicines for pain, discomfort, or fever as directed by your caregiver.   Keep all follow-up appointments as directed by your caregiver.   Keep all physical therapy appointments as directed by your caregiver.   If a neck  collar is prescribed, wear it as directed by your caregiver.   Do not drive while wearing a neck collar.   Make any needed adjustments to your work station to promote good posture.   Avoid positions and activities that make your symptoms worse.   Warm up and stretch before being active to help prevent problems.  SEEK MEDICAL CARE IF:   Your pain is not controlled with medicine.   You are unable to decrease your pain medicine over time as planned.   Your activity level is not improving as expected.  SEEK IMMEDIATE MEDICAL CARE IF:   You develop any bleeding, stomach upset, or signs of an allergic reaction to your medicine.   Your symptoms get worse.   You develop new, unexplained symptoms.   You have numbness, tingling, weakness, or paralysis in any part of your body.  MAKE SURE YOU:   Understand these instructions.   Will watch your condition.   Will get help right away if you are not doing well or get worse.  Document Released: 10/31/2006 Document Revised: 12/23/2010 Document Reviewed: 10/06/2010 [facility] Patient Information 2012 [place], [place].Contusion A contusion is a deep bruise. Contusions are the result of an injury that caused bleeding under the skin. The contusion may turn blue, purple, or yellow. Minor injuries will give you a painless contusion, but more severe contusions may stay painful and swollen for a few weeks.  CAUSES  A contusion is usually caused by a blow, trauma, or direct force to an area of the body. SYMPTOMS   Swelling and redness of the injured area.   Bruising of the injured area.   Tenderness and soreness of the injured area.   Pain.  DIAGNOSIS  The diagnosis can be made by taking a history and physical exam. An X-ray, CT scan, or MRI may be needed to determine if there were any associated injuries, such as fractures. TREATMENT  Specific treatment will depend on what area of the body was injured. In general, the best treatment for a  contusion is resting, icing, elevating, and applying cold compresses to the injured area. Over-the-counter medicines may also be recommended for pain control. Ask your caregiver what the best treatment is for your contusion. HOME CARE INSTRUCTIONS   Put ice on the injured area.   Put ice in a plastic bag.   Place a towel between your skin and the bag.   Leave the ice on for 15 to 20 minutes, 3 to 4 times a day.   Only take over-the-counter or prescription medicines for pain, discomfort, or fever as directed by your caregiver. Your caregiver may recommend avoiding anti-inflammatory medicines (aspirin, ibuprofen, and naproxen) for 48 hours because these medicines may increase bruising.   Rest the injured area.   If possible, elevate the injured area to reduce swelling.  SEEK IMMEDIATE MEDICAL CARE IF:   You have increased bruising or swelling.   You have pain that is getting worse.   Your swelling or pain is not relieved with medicines.  MAKE  SURE YOU:   Understand these instructions.   Will watch your condition.   Will get help right away if you are not doing well or get worse.  Document Released: 10/13/2004 Document Revised: 12/23/2010 Document Reviewed: 11/08/2010 [facility] Patient Information 2012 [place], [place].
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
## 2011-08-30 DIAGNOSIS — Z3009 Encounter for other general counseling and advice on contraception: Secondary | ICD-10-CM | POA: Diagnosis not present

## 2011-08-30 DIAGNOSIS — G43009 Migraine without aura, not intractable, without status migrainosus: Secondary | ICD-10-CM | POA: Diagnosis not present

## 2011-08-30 DIAGNOSIS — M25569 Pain in unspecified knee: Secondary | ICD-10-CM | POA: Diagnosis not present

## 2011-08-30 DIAGNOSIS — E669 Obesity, unspecified: Secondary | ICD-10-CM | POA: Diagnosis not present

## 2011-09-17 ENCOUNTER — Encounter (HOSPITAL_COMMUNITY): Payer: Self-pay | Admitting: Emergency Medicine

## 2011-09-17 ENCOUNTER — Emergency Department (HOSPITAL_BASED_OUTPATIENT_CLINIC_OR_DEPARTMENT_OTHER): Payer: Medicare Other

## 2011-09-17 ENCOUNTER — Encounter (HOSPITAL_BASED_OUTPATIENT_CLINIC_OR_DEPARTMENT_OTHER): Payer: Self-pay | Admitting: *Deleted

## 2011-09-17 ENCOUNTER — Emergency Department (HOSPITAL_COMMUNITY)
Admission: EM | Admit: 2011-09-17 | Discharge: 2011-09-18 | Disposition: A | Discharge status: Home / Self Care | Payer: Medicare Other | Attending: Emergency Medicine | Admitting: Emergency Medicine

## 2011-09-17 ENCOUNTER — Emergency Department (HOSPITAL_BASED_OUTPATIENT_CLINIC_OR_DEPARTMENT_OTHER)
Admission: EM | Admit: 2011-09-17 | Discharge: 2011-09-17 | Disposition: A | Discharge status: Home / Self Care | Payer: Medicare Other | Attending: Emergency Medicine | Admitting: Emergency Medicine

## 2011-09-17 DIAGNOSIS — Z4801 Encounter for change or removal of surgical wound dressing: Secondary | ICD-10-CM | POA: Insufficient documentation

## 2011-09-17 DIAGNOSIS — S6990XA Unspecified injury of unspecified wrist, hand and finger(s), initial encounter: Secondary | ICD-10-CM | POA: Diagnosis not present

## 2011-09-17 DIAGNOSIS — F429 Obsessive-compulsive disorder, unspecified: Secondary | ICD-10-CM | POA: Diagnosis not present

## 2011-09-17 DIAGNOSIS — F17200 Nicotine dependence, unspecified, uncomplicated: Secondary | ICD-10-CM | POA: Insufficient documentation

## 2011-09-17 DIAGNOSIS — S61209A Unspecified open wound of unspecified finger without damage to nail, initial encounter: Secondary | ICD-10-CM | POA: Insufficient documentation

## 2011-09-17 DIAGNOSIS — S62639A Displaced fracture of distal phalanx of unspecified finger, initial encounter for closed fracture: Secondary | ICD-10-CM | POA: Diagnosis not present

## 2011-09-17 DIAGNOSIS — S62639B Displaced fracture of distal phalanx of unspecified finger, initial encounter for open fracture: Secondary | ICD-10-CM

## 2011-09-17 DIAGNOSIS — F411 Generalized anxiety disorder: Secondary | ICD-10-CM | POA: Insufficient documentation

## 2011-09-17 DIAGNOSIS — I10 Essential (primary) hypertension: Secondary | ICD-10-CM | POA: Insufficient documentation

## 2011-09-17 DIAGNOSIS — W230XXA Caught, crushed, jammed, or pinched between moving objects, initial encounter: Secondary | ICD-10-CM | POA: Insufficient documentation

## 2011-09-17 DIAGNOSIS — S6980XA Other specified injuries of unspecified wrist, hand and finger(s), initial encounter: Secondary | ICD-10-CM | POA: Diagnosis not present

## 2011-09-17 DIAGNOSIS — Z5189 Encounter for other specified aftercare: Secondary | ICD-10-CM

## 2011-09-17 MED ORDER — SULFAMETHOXAZOLE-TRIMETHOPRIM 800-160 MG PO TABS: 1.0000 | ORAL_TABLET | Freq: Two times a day (BID) | ORAL | Status: AC

## 2011-09-17 MED ORDER — HYDROCODONE-ACETAMINOPHEN 5-325 MG PO TABS: 2.0000 | ORAL_TABLET | ORAL | Status: AC | PRN

## 2011-09-17 MED ORDER — IBUPROFEN 800 MG PO TABS
800.0000 mg | ORAL_TABLET | Freq: Once | ORAL | Status: AC
  Administered 2011-09-18: 800 mg via ORAL
  Filled 2011-09-17: qty 1

## 2011-09-17 MED ORDER — BUPIVACAINE HCL 0.5 % IJ SOLN
INTRAMUSCULAR | Status: AC
  Filled 2011-09-17: qty 1

## 2011-09-17 MED ORDER — OXYCODONE-ACETAMINOPHEN 5-325 MG PO TABS
1.0000 | ORAL_TABLET | Freq: Once | ORAL | Status: AC
  Administered 2011-09-18: 1 via ORAL
  Filled 2011-09-17: qty 1
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
## 2011-09-17 NOTE — ED Provider Notes (Signed)
History     CSN: [id number]  Arrival date & time 09/17/11  1313   First MD Initiated Contact with Patient 09/17/11 1342      Chief Complaint  Patient presents with  . Finger Injury    (Consider location/radiation/quality/duration/timing/severity/associated sxs/prior treatment) Patient is a 26 y.o. female presenting with hand pain. The history is provided by the patient. No language interpreter was used.  Hand Pain This is a new problem. The current episode started today. The problem occurs constantly. The problem has been unchanged. Associated symptoms include joint swelling and myalgias. Nothing aggravates the symptoms. She has tried nothing for the symptoms. The treatment provided moderate relief.  Pt complains of pain in her right 3rd finger from closing it in a car door.    Past Medical History  Diagnosis Date  . Hypertension   . Kidney disease     "infection"  . Anxiety   . OCD (obsessive compulsive disorder)     Past Surgical History  Procedure Date  . Leg surgery     History reviewed. No pertinent family history.  History  Substance Use Topics  . Smoking status: Current Everyday Smoker -- 0.5 packs/day    Types: Cigarettes  . Smokeless tobacco: Not on file  . Alcohol Use: Yes     rarely    OB History    Grav Para Term Preterm Abortions TAB SAB Ect Mult Living                  Review of Systems  Musculoskeletal: Positive for myalgias and joint swelling.  Skin: Positive for wound.  All other systems reviewed and are negative.    Allergies  Amoxicillin  Home Medications   Current Outpatient Rx  Name Route Sig Dispense Refill  . BUPROPION HCL ER (XL) 300 MG PO TB24 Oral Take 300 mg by mouth daily.      Marland Kitchen DIAZEPAM 5 MG PO TABS  1 po tid for spasm 21 tablet 0  . FLUOXETINE HCL 20 MG PO CAPS Oral Take 20 mg by mouth daily.    . IBUPROFEN 200 MG PO TABS Oral Take 400 mg by mouth as needed. FOR PAIN    . MEDROXYPROGESTERONE ACETATE 150 MG/ML IM SUSP  Intramuscular Inject 150 mg into the muscle every 3 (three) months. Due next week(first week of December)     . MELOXICAM 7.5 MG PO TABS  1 po bid for swelling and inflammation 12 tablet 0  . PROMETHAZINE HCL 25 MG PO TABS Oral Take 1 tablet (25 mg total) by mouth every 6 (six) hours as needed for nausea. 12 tablet 0    BP 132/82  Pulse 95  Temp 98.2 F (36.8 C) (Oral)  Resp 20  SpO2 100%  Physical Exam  Nursing note and vitals reviewed. Constitutional: She is oriented to person, place, and time. She appears well-developed and well-nourished.  HENT:  Head: Normocephalic.  Musculoskeletal: She exhibits tenderness.       Tender left 3rd finger, 1cm laceration right 3rd finger gapping  Neurological: She is alert and oriented to person, place, and time. She has normal reflexes.  Skin: Skin is warm and dry.  Psychiatric: She has a normal mood and affect.    ED Course  LACERATION REPAIR Date/Time: 09/17/2011 3:10 PM Performed by: Elson Areas Authorized by: Elson Areas Consent: Verbal consent obtained. Risks and benefits: risks, benefits and alternatives were discussed Consent given by: patient Patient understanding: patient states understanding of the  procedure being performed Patient identity confirmed: verbally with patient Time out: Immediately prior to procedure a "time out" was called to verify the correct patient, procedure, equipment, support staff and site/side marked as required. Local anesthetic: bupivacaine 0.25% without epinephrine Irrigation solution: saline Debridement: none Degree of undermining: none Skin closure: 5-0 Prolene Number of sutures: 3 Technique: simple Approximation: loose Approximation difficulty: simple Patient tolerance: Patient tolerated the procedure well with no immediate complications. Comments: Wound irrigated copiously,  No obvious tendon injury.    Pt counseled on need for antibiotics and need for follow up with Dr. Melvyn Novas due to  open fracture   (including critical care time)  Labs Reviewed - No data to display No results found.   No diagnosis found.    MDM  Bactrim ds 20 bid, hydrocodone [address]        Lonia Skinner [place], [place] 09/17/11 1513

## 2011-09-17 NOTE — ED Notes (Signed)
Closed right middle finger in the cardoor. ?open fx noted. Does not feel touch. Moves slightly.

## 2011-09-18 DIAGNOSIS — I10 Essential (primary) hypertension: Secondary | ICD-10-CM | POA: Diagnosis not present

## 2011-09-18 DIAGNOSIS — F429 Obsessive-compulsive disorder, unspecified: Secondary | ICD-10-CM | POA: Diagnosis not present

## 2011-09-18 DIAGNOSIS — Z4801 Encounter for change or removal of surgical wound dressing: Secondary | ICD-10-CM | POA: Diagnosis not present

## 2011-09-18 DIAGNOSIS — F17200 Nicotine dependence, unspecified, uncomplicated: Secondary | ICD-10-CM | POA: Diagnosis not present

## 2011-09-18 NOTE — ED Provider Notes (Signed)
History/physical exam/procedure(s) were performed by non-physician practitioner and as supervising physician I was immediately available for consultation/collaboration. I have reviewed all notes and am in agreement with care and plan.   Hilario Quarry, MD 09/18/11 2308

## 2011-09-18 NOTE — ED Provider Notes (Signed)
Medical screening examination/treatment/procedure(s) were performed by non-physician practitioner and as supervising physician I was immediately available for consultation/collaboration.    Vida Roller, MD 09/18/11 [phone]

## 2011-09-18 NOTE — ED Provider Notes (Signed)
History     CSN: [id number]  Arrival date & time 09/17/11  2216   First MD Initiated Contact with Patient 09/17/11 2319      Chief Complaint  Patient presents with  . Finger Injury    (Consider location/radiation/quality/duration/timing/severity/associated sxs/prior treatment) HPI Comments: Pt was seen this AM at [facility] after slamming car door on R middle finger.  The laceration to the distal phalynx  Was repaired.  She is on antibiotic and pain medicine.  The hydrocodone has not helped.  She has an underlying nondisplaced fracture.  The history is provided by the patient. No language interpreter was used.    Past Medical History  Diagnosis Date  . Hypertension   . Kidney disease     "infection"  . Anxiety   . OCD (obsessive compulsive disorder)     Past Surgical History  Procedure Date  . Leg surgery     History reviewed. No pertinent family history.  History  Substance Use Topics  . Smoking status: Current Everyday Smoker -- 0.5 packs/day    Types: Cigarettes  . Smokeless tobacco: Not on file  . Alcohol Use: Yes     rarely    OB History    Grav Para Term Preterm Abortions TAB SAB Ect Mult Living                  Review of Systems  Musculoskeletal:       Finger injury   Skin: Positive for wound.  Neurological: Positive for numbness.  All other systems reviewed and are negative.    Allergies  Amoxicillin  Home Medications   Current Outpatient Rx  Name Route Sig Dispense Refill  . BUPROPION HCL ER (XL) 300 MG PO TB24 Oral Take 300 mg by mouth daily.      Marland Kitchen DIAZEPAM 5 MG PO TABS  1 po tid for spasm 21 tablet 0  . FLUOXETINE HCL 20 MG PO CAPS Oral Take 20 mg by mouth daily.    Marland Kitchen HYDROCODONE-ACETAMINOPHEN 5-325 MG PO TABS Oral Take 2 tablets by mouth every 4 (four) hours as needed for pain. 20 tablet 0  . IBUPROFEN 200 MG PO TABS Oral Take 400 mg by mouth as needed. FOR PAIN    . MEDROXYPROGESTERONE ACETATE 150 MG/ML IM SUSP Intramuscular Inject 150  mg into the muscle every 3 (three) months. Due next week(first week of December)     . MELOXICAM 7.5 MG PO TABS  1 po bid for swelling and inflammation 12 tablet 0  . PROMETHAZINE HCL 25 MG PO TABS Oral Take 1 tablet (25 mg total) by mouth every 6 (six) hours as needed for nausea. 12 tablet 0  . SULFAMETHOXAZOLE-TRIMETHOPRIM 800-160 MG PO TABS Oral Take 1 tablet by mouth every 12 (twelve) hours. 20 tablet 0    BP 131/82  Pulse 78  Temp 98.5 F (36.9 C) (Oral)  Resp 14  Ht 5\' 8"  (1.727 m)  Wt 184 lb (83.462 kg)  BMI 27.98 kg/m2  SpO2 97%  Physical Exam  Nursing note and vitals reviewed. Constitutional: She is oriented to person, place, and time. She appears well-developed and well-nourished. No distress.  HENT:  Head: Normocephalic and atraumatic.  Eyes: EOM are normal.  Neck: Normal range of motion.  Cardiovascular: Normal rate, regular rhythm and normal heart sounds.   Pulmonary/Chest: Effort normal and breath sounds normal.  Abdominal: Soft. She exhibits no distension. There is no tenderness.  Musculoskeletal: She exhibits tenderness.  Right hand: She exhibits decreased range of motion, tenderness, bony tenderness, laceration and swelling. She exhibits normal capillary refill and no deformity.       Hands: Neurological: She is alert and oriented to person, place, and time.  Skin: Skin is warm and dry.  Psychiatric: She has a normal mood and affect. Judgment normal.    ED Course  Procedures (including critical care time)  Labs Reviewed - No data to display Dg Finger Middle Right  09/17/2011  *RADIOLOGY REPORT*  Clinical Data: Long finger injury.Long finger pain.  RIGHT MIDDLE FINGER 2+V  Comparison: None.  Findings: Transverse fracture through the base of the terminal phalanx of the right long finger.  Fracture is nondisplaced.  No intra-articular extension.  Soft tissue swelling is present.  There is flexion of the finger present on all three views.  IMPRESSION:  Transverse nondisplaced fracture of the right long finger terminal phalanx base.   Original Report Authenticated By: Andreas Newport, M.D.      1. Visit for wound check       MDM  Pt reassured the wound looks good and to f/u with dr. Melvyn Novas.        Evalina Field, [place] 09/18/11 [phone]

## 2011-09-23 DIAGNOSIS — S62609B Fracture of unspecified phalanx of unspecified finger, initial encounter for open fracture: Secondary | ICD-10-CM | POA: Diagnosis not present

## 2011-09-26 DIAGNOSIS — Y929 Unspecified place or not applicable: Secondary | ICD-10-CM | POA: Diagnosis not present

## 2011-09-26 DIAGNOSIS — X58XXXA Exposure to other specified factors, initial encounter: Secondary | ICD-10-CM | POA: Diagnosis not present

## 2011-09-26 DIAGNOSIS — S62639B Displaced fracture of distal phalanx of unspecified finger, initial encounter for open fracture: Secondary | ICD-10-CM | POA: Diagnosis not present

## 2011-09-26 DIAGNOSIS — Y999 Unspecified external cause status: Secondary | ICD-10-CM | POA: Diagnosis not present

## 2011-09-26 DIAGNOSIS — Y939 Activity, unspecified: Secondary | ICD-10-CM | POA: Diagnosis not present

## 2011-09-28 DIAGNOSIS — F4001 Agoraphobia with panic disorder: Secondary | ICD-10-CM | POA: Diagnosis not present

## 2011-09-28 DIAGNOSIS — F0630 Mood disorder due to known physiological condition, unspecified: Secondary | ICD-10-CM | POA: Diagnosis not present

## 2011-10-06 ENCOUNTER — Other Ambulatory Visit: Payer: Self-pay | Admitting: Orthopedic Surgery

## 2011-10-06 DIAGNOSIS — R52 Pain, unspecified: Secondary | ICD-10-CM

## 2011-10-07 ENCOUNTER — Other Ambulatory Visit: Payer: Medicare Other

## 2011-10-10 DIAGNOSIS — S62639B Displaced fracture of distal phalanx of unspecified finger, initial encounter for open fracture: Secondary | ICD-10-CM | POA: Diagnosis not present

## 2011-10-27 DIAGNOSIS — S62639B Displaced fracture of distal phalanx of unspecified finger, initial encounter for open fracture: Secondary | ICD-10-CM | POA: Diagnosis not present

## 2011-11-22 ENCOUNTER — Emergency Department (HOSPITAL_COMMUNITY)
Admission: EM | Admit: 2011-11-22 | Discharge: 2011-11-22 | Disposition: A | Discharge status: Home / Self Care | Payer: Medicare Other | Attending: Emergency Medicine | Admitting: Emergency Medicine

## 2011-11-22 ENCOUNTER — Emergency Department (HOSPITAL_COMMUNITY): Payer: Medicare Other

## 2011-11-22 ENCOUNTER — Encounter (HOSPITAL_COMMUNITY): Payer: Self-pay | Admitting: Emergency Medicine

## 2011-11-22 DIAGNOSIS — F17200 Nicotine dependence, unspecified, uncomplicated: Secondary | ICD-10-CM | POA: Diagnosis not present

## 2011-11-22 DIAGNOSIS — Z79899 Other long term (current) drug therapy: Secondary | ICD-10-CM | POA: Insufficient documentation

## 2011-11-22 DIAGNOSIS — Z8659 Personal history of other mental and behavioral disorders: Secondary | ICD-10-CM | POA: Diagnosis not present

## 2011-11-22 DIAGNOSIS — F429 Obsessive-compulsive disorder, unspecified: Secondary | ICD-10-CM | POA: Insufficient documentation

## 2011-11-22 DIAGNOSIS — R0789 Other chest pain: Secondary | ICD-10-CM | POA: Insufficient documentation

## 2011-11-22 DIAGNOSIS — R0602 Shortness of breath: Secondary | ICD-10-CM | POA: Diagnosis not present

## 2011-11-22 DIAGNOSIS — R0609 Other forms of dyspnea: Secondary | ICD-10-CM | POA: Diagnosis not present

## 2011-11-22 DIAGNOSIS — R000 Tachycardia, unspecified: Secondary | ICD-10-CM | POA: Insufficient documentation

## 2011-11-22 DIAGNOSIS — Z87448 Personal history of other diseases of urinary system: Secondary | ICD-10-CM | POA: Diagnosis not present

## 2011-11-22 DIAGNOSIS — M549 Dorsalgia, unspecified: Secondary | ICD-10-CM | POA: Insufficient documentation

## 2011-11-22 DIAGNOSIS — R079 Chest pain, unspecified: Secondary | ICD-10-CM | POA: Diagnosis not present

## 2011-11-22 DIAGNOSIS — I10 Essential (primary) hypertension: Secondary | ICD-10-CM | POA: Diagnosis not present

## 2011-11-22 DIAGNOSIS — R0600 Dyspnea, unspecified: Secondary | ICD-10-CM

## 2011-11-22 DIAGNOSIS — R0989 Other specified symptoms and signs involving the circulatory and respiratory systems: Secondary | ICD-10-CM | POA: Insufficient documentation

## 2011-11-22 LAB — BASIC METABOLIC PANEL
BUN: 7 mg/dL (ref 6–23)
CO2: 22 mEq/L (ref 19–32)
Chloride: 105 mEq/L (ref 96–112)
Creatinine, Ser: 0.59 mg/dL (ref 0.50–1.10)
GFR calc Af Amer: >90 mL/min (ref >90)
Glucose, Bld: 75 mg/dL (ref 70–99)
Potassium: 3.5 mEq/L (ref 3.5–5.1)

## 2011-11-22 LAB — URINALYSIS, ROUTINE W REFLEX MICROSCOPIC
Bilirubin Urine: NEGATIVE
Glucose, UA: NEGATIVE mg/dL
Leukocytes, UA: NEGATIVE
Nitrite: NEGATIVE
Specific Gravity, Urine: 1.03 (ref 1.005–1.030)
pH: 6 (ref 5.0–8.0)

## 2011-11-22 LAB — CBC WITH DIFFERENTIAL/PLATELET
HCT: 40.6 % (ref 36.0–46.0)
Hemoglobin: 14.6 g/dL (ref 12.0–15.0)
Lymphocytes Relative: 40 % (ref 12–46)
Lymphs Abs: 3.1 10*3/uL (ref 0.7–4.0)
Monocytes Absolute: 0.7 10*3/uL (ref 0.1–1.0)
Monocytes Relative: 9 % (ref 3–12)
Neutro Abs: 3.8 10*3/uL (ref 1.7–7.7)
Neutrophils Relative %: 48 % (ref 43–77)
RBC: 4.59 MIL/uL (ref 3.87–5.11)

## 2011-11-22 LAB — PREGNANCY, URINE: Preg Test, Ur: NEGATIVE

## 2011-11-22 MED ORDER — ALBUTEROL SULFATE HFA 108 (90 BASE) MCG/ACT IN AERS
2.0000 | INHALATION_SPRAY | Freq: Four times a day (QID) | RESPIRATORY_TRACT | Status: DC
  Administered 2011-11-22 (×2): 2 via RESPIRATORY_TRACT
  Filled 2011-11-22: qty 6.7

## 2011-11-22 MED ORDER — HYDROMORPHONE HCL PF 1 MG/ML IJ SOLN
1.0000 mg | Freq: Once | INTRAMUSCULAR | Status: AC
  Administered 2011-11-22: 1 mg via INTRAVENOUS
  Filled 2011-11-22: qty 1

## 2011-11-22 MED ORDER — PROMETHAZINE HCL 25 MG PO TABS: 25.0000 mg | ORAL_TABLET | Freq: Four times a day (QID) | ORAL | Status: DC | PRN

## 2011-11-22 MED ORDER — SODIUM CHLORIDE 0.9 % IV BOLUS (SEPSIS)
1000.0000 mL | Freq: Once | INTRAVENOUS | Status: AC
  Administered 2011-11-22: 1000 mL via INTRAVENOUS

## 2011-11-22 MED ORDER — HYDROCODONE-ACETAMINOPHEN 5-325 MG PO TABS: 1.0000 | ORAL_TABLET | Freq: Three times a day (TID) | ORAL | Status: DC | PRN

## 2011-11-22 MED ORDER — PROMETHAZINE HCL 25 MG/ML IJ SOLN
25.0000 mg | Freq: Four times a day (QID) | INTRAMUSCULAR | Status: DC | PRN
  Administered 2011-11-22 (×2): 25 mg via INTRAVENOUS
  Filled 2011-11-22: qty 1

## 2011-11-22 MED ORDER — PROMETHAZINE HCL 25 MG/ML IJ SOLN
25.0000 mg | Freq: Once | INTRAMUSCULAR | Status: DC
  Filled 2011-11-22: qty 1

## 2011-11-22 NOTE — ED Notes (Signed)
Pt was brought in by a nurse who saw her outside.  Pt was hanging over side of wheelchair and had stridor.  When you ask pt a question, pt talks in complete sentences and sits up straight when asked to.  Pt only has stridor/wheezing when there is a lull in talking.  Pt has rhonchi.  Was shadowing Dr. Hyacinth Meeker last night and he gave her levaquin and an albuterol inhaler.  Pt's sats are 100% and HR is 100.

## 2011-11-22 NOTE — ED Provider Notes (Signed)
History     CSN: [id number]  Arrival date & time 11/22/11  1318   First MD Initiated Contact with Patient 11/22/11 1507      Chief Complaint  Patient presents with  . Shortness of Breath    (Consider location/radiation/quality/duration/timing/severity/associated sxs/prior treatment) HPI Patient presents with ongoing dyspnea, chest pain, new back pain.  Symptoms began approximately 3 days ago, initially with dyspnea and chest congestion and tightness.  She was started on albuterol, Levaquin.  She notes over the past 3 days her symptoms have persisted, with new diffuse mid back pain.  She notes mild associated positional dyspnea, decreased exercise capacity.  She denies confusion, disorientation.  She has had fever, but none currently. Past Medical History  Diagnosis Date  . Hypertension   . Kidney disease     "infection"  . Anxiety   . OCD (obsessive compulsive disorder)     Past Surgical History  Procedure Date  . Leg surgery     No family history on file.  History  Substance Use Topics  . Smoking status: Current Every Day Smoker -- 0.5 packs/day    Types: Cigarettes  . Smokeless tobacco: Not on file  . Alcohol Use: Yes     Comment: rarely    OB History    Grav Para Term Preterm Abortions TAB SAB Ect Mult Living                  Review of Systems  Constitutional:       Per HPI, otherwise negative  HENT:       Per HPI, otherwise negative  Eyes: Negative.   Respiratory:       Per HPI, otherwise negative  Cardiovascular:       Per HPI, otherwise negative  Gastrointestinal: Negative for vomiting.  Genitourinary: Negative.   Musculoskeletal:       Per HPI, otherwise negative  Skin: Negative.   Neurological: Negative for syncope.    Allergies  Amoxicillin  Home Medications   Current Outpatient Rx  Name  Route  Sig  Dispense  Refill  . ACETAMINOPHEN 500 MG PO TABS   Oral   Take 500-1,000 mg by mouth every 6 (six) hours as needed. For pain         . ALBUTEROL SULFATE HFA 108 (90 BASE) MCG/ACT IN AERS   Inhalation   Inhale 2 puffs into the lungs every 6 (six) hours as needed. For shortness of breath         . BUPROPION HCL ER (XL) 300 MG PO TB24   Oral   Take 300 mg by mouth daily.           Marland Kitchen FLUOXETINE HCL 20 MG PO CAPS   Oral   Take 20 mg by mouth daily.         . IBUPROFEN 200 MG PO TABS   Oral   Take 400 mg by mouth as needed. FOR PAIN         . MEDROXYPROGESTERONE ACETATE 150 MG/ML IM SUSP   Intramuscular   Inject 150 mg into the muscle every 3 (three) months. Due next week(first week of December)          . PROMETHAZINE HCL 25 MG PO TABS   Oral   Take 1 tablet (25 mg total) by mouth every 6 (six) hours as needed for nausea.   12 tablet   0     BP 139/94  Pulse 90  Temp 98 F (36.7  C) (Oral)  Resp 18  SpO2 100%  Physical Exam  Nursing note and vitals reviewed. Constitutional: She is oriented to person, place, and time. She appears well-developed and well-nourished. No distress.  HENT:  Head: Normocephalic and atraumatic.  Eyes: Conjunctivae normal and EOM are normal.  Cardiovascular: Regular rhythm.  Tachycardia present.   Pulmonary/Chest: Effort normal and breath sounds normal. No stridor. No respiratory distress.  Abdominal: She exhibits no distension.  Musculoskeletal: She exhibits no edema.  Neurological: She is alert and oriented to person, place, and time. No cranial nerve deficit.  Skin: Skin is warm and dry.  Psychiatric: She has a normal mood and affect.    ED Course  Procedures (including critical care time)  Labs Reviewed  URINALYSIS, ROUTINE W REFLEX MICROSCOPIC - Abnormal; Notable for the following:    APPearance CLOUDY (*)     Ketones, ur 40 (*)     All other components within normal limits  PREGNANCY, URINE  CBC WITH DIFFERENTIAL  BASIC METABOLIC PANEL  D-DIMER, QUANTITATIVE   Dg Chest 2 View  11/22/2011  *RADIOLOGY REPORT*  Clinical Data: Chest pain and shortness  of breath  CHEST - 2 VIEW  Comparison: July 10, 2011  Findings:  Lungs clear.  Heart size and pulmonary vascularity are normal.  No adenopathy.  No bone lesions.   IMPRESSION: Lungs clear.   Original Report Authenticated By: Bretta Bang, M.D.      No diagnosis found.  7:09 PM Patient is feeling better.  D/C home   Date: 11/22/2011  Rate: 95  Rhythm: normal sinus rhythm  QRS Axis: normal  Intervals: PR shortened  ST/T Wave abnormalities: normal  Conduction Disutrbances:none  Narrative Interpretation:   Old EKG Reviewed: changes noted Faster, abnormal   MDM  This previously well young female now presents with concerns of ongoing dyspnea.  Notably, the patient was evaluated by another physician, and recently started on Levaquin and albuterol.  On my exam she is in no distress, and is afebrile, for mild tachycardia, mild hypertension and has vital sign abnormalities.  Given her smoking history, there is mild increased her risk for thromboembolic process.  Though, a d-dimer was negative.  Following multiple albuterol treatments analgesics, the patient was substantially better.  We discussed the need for close outpatient followup, and she was discharged in stable condition.       Gerhard Munch, MD 11/22/11 2322

## 2011-11-22 NOTE — ED Notes (Signed)
Pt reports lower back and bil flank pain since last night. Sts she has a Hx of renal infections that have worsened to the point she has almost had to go on dialysis. Reports mild urinary urgency, no burning. C/o nausea, sts she has been dry heaving since last night as well. "everything feels bad, it's starting to make my head hurt. Pain was in my neck, like it was swollen and hurt to turn my head, but it was feeling better today, so I went to school."

## 2011-12-17 DIAGNOSIS — K2970 Gastritis, unspecified, without bleeding: Secondary | ICD-10-CM | POA: Diagnosis not present

## 2011-12-17 DIAGNOSIS — R112 Nausea with vomiting, unspecified: Secondary | ICD-10-CM | POA: Diagnosis not present

## 2011-12-17 DIAGNOSIS — R079 Chest pain, unspecified: Secondary | ICD-10-CM | POA: Diagnosis not present

## 2011-12-17 DIAGNOSIS — K2990 Gastroduodenitis, unspecified, without bleeding: Secondary | ICD-10-CM | POA: Diagnosis not present

## 2011-12-18 ENCOUNTER — Encounter (HOSPITAL_BASED_OUTPATIENT_CLINIC_OR_DEPARTMENT_OTHER): Payer: Self-pay | Admitting: *Deleted

## 2011-12-18 ENCOUNTER — Emergency Department (HOSPITAL_BASED_OUTPATIENT_CLINIC_OR_DEPARTMENT_OTHER)
Admission: EM | Admit: 2011-12-18 | Discharge: 2011-12-18 | Disposition: A | Discharge status: Home Health | Payer: Medicare Other | Attending: Emergency Medicine | Admitting: Emergency Medicine

## 2011-12-18 DIAGNOSIS — F17200 Nicotine dependence, unspecified, uncomplicated: Secondary | ICD-10-CM | POA: Insufficient documentation

## 2011-12-18 DIAGNOSIS — R1013 Epigastric pain: Secondary | ICD-10-CM | POA: Insufficient documentation

## 2011-12-18 DIAGNOSIS — M545 Low back pain, unspecified: Secondary | ICD-10-CM | POA: Diagnosis not present

## 2011-12-18 DIAGNOSIS — I10 Essential (primary) hypertension: Secondary | ICD-10-CM | POA: Insufficient documentation

## 2011-12-18 DIAGNOSIS — R079 Chest pain, unspecified: Secondary | ICD-10-CM | POA: Insufficient documentation

## 2011-12-18 DIAGNOSIS — K529 Noninfective gastroenteritis and colitis, unspecified: Secondary | ICD-10-CM

## 2011-12-18 DIAGNOSIS — K5289 Other specified noninfective gastroenteritis and colitis: Secondary | ICD-10-CM | POA: Insufficient documentation

## 2011-12-18 DIAGNOSIS — Z79899 Other long term (current) drug therapy: Secondary | ICD-10-CM | POA: Diagnosis not present

## 2011-12-18 DIAGNOSIS — Z87440 Personal history of urinary (tract) infections: Secondary | ICD-10-CM | POA: Diagnosis not present

## 2011-12-18 DIAGNOSIS — Z8659 Personal history of other mental and behavioral disorders: Secondary | ICD-10-CM | POA: Diagnosis not present

## 2011-12-18 LAB — URINE MICROSCOPIC-ADD ON

## 2011-12-18 LAB — URINALYSIS, ROUTINE W REFLEX MICROSCOPIC
Glucose, UA: NEGATIVE mg/dL
Hgb urine dipstick: NEGATIVE
Specific Gravity, Urine: 1.027 (ref 1.005–1.030)
Urobilinogen, UA: 0.2 mg/dL (ref 0.0–1.0)
pH: 5.5 (ref 5.0–8.0)

## 2011-12-18 LAB — PREGNANCY, URINE: Preg Test, Ur: NEGATIVE

## 2011-12-18 MED ORDER — PROMETHAZINE HCL 25 MG/ML IJ SOLN
25.0000 mg | Freq: Once | INTRAMUSCULAR | Status: AC
  Administered 2011-12-18: 25 mg via INTRAVENOUS
  Filled 2011-12-18: qty 1

## 2011-12-18 MED ORDER — DIPHENOXYLATE-ATROPINE 2.5-0.025 MG PO TABS
2.0000 | ORAL_TABLET | Freq: Once | ORAL | Status: AC
  Administered 2011-12-18: 2 via ORAL

## 2011-12-18 MED ORDER — SODIUM CHLORIDE 0.9 % IV BOLUS (SEPSIS)
1000.0000 mL | Freq: Once | INTRAVENOUS | Status: AC
  Administered 2011-12-18: 1000 mL via INTRAVENOUS

## 2011-12-18 MED ORDER — PROMETHAZINE HCL 25 MG PO TABS: 25.0000 mg | ORAL_TABLET | Freq: Four times a day (QID) | ORAL | Status: DC | PRN

## 2011-12-18 MED ORDER — SODIUM CHLORIDE 0.9 % IV BOLUS (SEPSIS)
1000.0000 mL | Freq: Once | INTRAVENOUS | Status: AC
Start: 2011-12-18 — End: 2011-12-18
  Administered 2011-12-18: 1000 mL via INTRAVENOUS

## 2011-12-18 MED ORDER — DIPHENOXYLATE-ATROPINE 2.5-0.025 MG PO TABS: 1.0000 | ORAL_TABLET | Freq: Four times a day (QID) | ORAL | Status: DC | PRN

## 2011-12-18 MED ORDER — DIPHENOXYLATE-ATROPINE 2.5-0.025 MG PO TABS
ORAL_TABLET | ORAL | Status: AC
  Filled 2011-12-18: qty 2

## 2011-12-18 MED ORDER — FENTANYL CITRATE 0.05 MG/ML IJ SOLN
50.0000 ug | Freq: Once | INTRAMUSCULAR | Status: AC
  Administered 2011-12-18: 50 ug via INTRAVENOUS
  Filled 2011-12-18: qty 2

## 2011-12-18 NOTE — ED Provider Notes (Signed)
History  This chart was scribed for Maria Seamen, MD by Ardeen Jourdain, ED Scribe. This patient was seen in room MH06/MH06 and the patient's care was started at 0035.  CSN: [id number]  Arrival date & time 12/18/11  Rich Fuchs   First MD Initiated Contact with Patient 12/18/11 [phone]      Chief Complaint  Patient presents with  . Nausea and vomiting        The history is provided by the patient. No language interpreter was used.    [name] is a 26 y.o. female brought in by ambulance, who presents to the Emergency Department complaining of emesis with associated chest pain, lower back pain, abdominal pain and diarrhea. She reports the pain began suddenly at 6:00 PM and has been gradually worsening since. She states she was able to eat dinner then she became sick. She states the pain in her chest and back are both "really bad." She denies fever, urinary symptoms and any recent injury. She was given Zofran by EMS, which she states relieved her nausea "a little."  Past Medical History  Diagnosis Date  . Hypertension   . Kidney disease     "infection"  . Anxiety   . OCD (obsessive compulsive disorder)     Past Surgical History  Procedure Date  . Leg surgery     No family history on file.  History  Substance Use Topics  . Smoking status: Current Every Day Smoker -- 0.5 packs/day    Types: Cigarettes  . Smokeless tobacco: Not on file  . Alcohol Use: Yes     Comment: rarely   No OB history available.   Review of Systems  Constitutional: Negative for fever.  HENT:       Dry mouth   Respiratory: Positive for shortness of breath.   Gastrointestinal: Positive for nausea, vomiting, abdominal pain and diarrhea.  Genitourinary: Negative for difficulty urinating.  Musculoskeletal: Positive for back pain.  All other systems reviewed and are negative.    Allergies  Amoxicillin  Home Medications   Current Outpatient Rx  Name  Route  Sig  Dispense  Refill  . ALBUTEROL  SULFATE HFA 108 (90 BASE) MCG/ACT IN AERS   Inhalation   Inhale 2 puffs into the lungs every 6 (six) hours as needed. For shortness of breath         . BUPROPION HCL ER (XL) 300 MG PO TB24   Oral   Take 300 mg by mouth daily.           Marland Kitchen FLUOXETINE HCL 20 MG PO CAPS   Oral   Take 20 mg by mouth daily.         Marland Kitchen HYDROCODONE-ACETAMINOPHEN 5-325 MG PO TABS   Oral   Take 1 tablet by mouth every 8 (eight) hours as needed for pain.   15 tablet   0   . IBUPROFEN 200 MG PO TABS   Oral   Take 400 mg by mouth as needed. FOR PAIN         . MEDROXYPROGESTERONE ACETATE 150 MG/ML IM SUSP   Intramuscular   Inject 150 mg into the muscle every 3 (three) months. Due next week(first week of December)          . PROMETHAZINE HCL 25 MG PO TABS   Oral   Take 1 tablet (25 mg total) by mouth every 6 (six) hours as needed for nausea.   12 tablet   0  Triage Vitals: BP 146/96  Pulse 84  Temp 98.6 F (37 C) (Oral)  Resp 20  SpO2 97%  Physical Exam  Nursing note and vitals reviewed. Constitutional: She is oriented to person, place, and time. She appears well-developed and well-nourished. No distress.  HENT:  Head: Normocephalic and atraumatic.  Mouth/Throat: No oropharyngeal exudate.       Oropharynx dry   Eyes: Conjunctivae normal and EOM are normal. Pupils are equal, round, and reactive to light.  Neck: Normal range of motion. Neck supple. No tracheal deviation present.  Cardiovascular: Normal rate, regular rhythm and normal heart sounds.   Pulmonary/Chest: Effort normal and breath sounds normal. No respiratory distress.  Abdominal: Soft. She exhibits no distension. There is tenderness.       Significant epigastric tenderness  Musculoskeletal: Normal range of motion. She exhibits no edema.       No significant tenderness to back  Neurological: She is alert and oriented to person, place, and time.  Skin: Skin is warm and dry.  Psychiatric: She has a normal mood and affect.  Her behavior is normal.    ED Course  Procedures (including critical care time)  DIAGNOSTIC STUDIES: Oxygen Saturation is 97% on room air, normal by my interpretation.    COORDINATION OF CARE:  12:40 AM: Discussed treatment plan which includes a urinalysis and pregnancy test with pt at bedside and pt agreed to plan.     MDM   Nursing notes and vitals signs, including pulse oximetry, reviewed.  Summary of this visit's results, reviewed by myself:  Labs:  Results for orders placed during the hospital encounter of 12/18/11 (from the past 24 hour(s))  URINALYSIS, ROUTINE W REFLEX MICROSCOPIC     Status: Abnormal   Collection Time   12/18/11  1:44 AM      Component Value Range   Color, Urine AMBER (*) YELLOW   APPearance CLOUDY (*) CLEAR   Specific Gravity, Urine 1.027  1.005 - 1.030   pH 5.5  5.0 - 8.0   Glucose, UA NEGATIVE  NEGATIVE mg/dL   Hgb urine dipstick NEGATIVE  NEGATIVE   Bilirubin Urine MODERATE (*) NEGATIVE   Ketones, ur >80 (*) NEGATIVE mg/dL   Protein, ur 30 (*) NEGATIVE mg/dL   Urobilinogen, UA 0.2  0.0 - 1.0 mg/dL   Nitrite NEGATIVE  NEGATIVE   Leukocytes, UA NEGATIVE  NEGATIVE  PREGNANCY, URINE     Status: Normal   Collection Time   12/18/11  1:44 AM      Component Value Range   Preg Test, Ur NEGATIVE  NEGATIVE  URINE MICROSCOPIC-ADD ON     Status: Abnormal   Collection Time   12/18/11  1:44 AM      Component Value Range   Squamous Epithelial / LPF MANY (*) RARE   WBC, UA 0-2  <3 WBC/hpf   RBC / HPF 0-2  <3 RBC/hpf   Bacteria, UA MANY (*) RARE   Urine-Other MUCOUS PRESENT     4:50 AM Drinking fluids without emesis at this time. Is ambulated the bathroom without difficulty.  EKG Interpretation:  Date & Time: 12/17/2011 11:40 PM  Rate: 90  Rhythm: normal sinus rhythm  QRS Axis: normal  Intervals: PR shortened  ST/T Wave abnormalities: normal  Conduction Disutrbances:none  Narrative Interpretation:   Old EKG Reviewed: none  available      I personally performed the services described in this documentation, which was scribed in my presence.  The recorded information has been reviewed  and accurate.Carlisle Beers Warwick Nick, MD 12/18/11 [phone]

## 2011-12-18 NOTE — ED Notes (Signed)
Pt. States that she started having chest pain around 6pm while watching a movie. Describes as a dull ache that "came in waves" states she ate a piece of pizza and immediately started having  N/v/d. Denies any fevers. EMS gave zofran and states that has helped "a little" c/o general abd pain. Describes as "sharp" pains and also c/o lower back pain. Denies any injury to her back. Denies any urinary symptoms. C/o sob with emesis.

## 2012-01-03 DIAGNOSIS — Z23 Encounter for immunization: Secondary | ICD-10-CM | POA: Diagnosis not present

## 2012-01-23 ENCOUNTER — Encounter (HOSPITAL_COMMUNITY): Payer: Self-pay | Admitting: Emergency Medicine

## 2012-01-23 ENCOUNTER — Emergency Department (HOSPITAL_COMMUNITY): Payer: Medicare Other

## 2012-01-23 ENCOUNTER — Emergency Department (HOSPITAL_COMMUNITY)
Admission: EM | Admit: 2012-01-23 | Discharge: 2012-01-23 | Disposition: A | Discharge status: Home / Self Care | Payer: Medicare Other | Attending: Emergency Medicine | Admitting: Emergency Medicine

## 2012-01-23 DIAGNOSIS — R0602 Shortness of breath: Secondary | ICD-10-CM | POA: Diagnosis not present

## 2012-01-23 DIAGNOSIS — R042 Hemoptysis: Secondary | ICD-10-CM | POA: Diagnosis not present

## 2012-01-23 DIAGNOSIS — F429 Obsessive-compulsive disorder, unspecified: Secondary | ICD-10-CM | POA: Diagnosis not present

## 2012-01-23 DIAGNOSIS — R05 Cough: Secondary | ICD-10-CM | POA: Insufficient documentation

## 2012-01-23 DIAGNOSIS — Z791 Long term (current) use of non-steroidal anti-inflammatories (NSAID): Secondary | ICD-10-CM | POA: Diagnosis not present

## 2012-01-23 DIAGNOSIS — F17200 Nicotine dependence, unspecified, uncomplicated: Secondary | ICD-10-CM | POA: Diagnosis not present

## 2012-01-23 DIAGNOSIS — R059 Cough, unspecified: Secondary | ICD-10-CM | POA: Diagnosis not present

## 2012-01-23 DIAGNOSIS — Z79899 Other long term (current) drug therapy: Secondary | ICD-10-CM | POA: Insufficient documentation

## 2012-01-23 DIAGNOSIS — M7989 Other specified soft tissue disorders: Secondary | ICD-10-CM | POA: Insufficient documentation

## 2012-01-23 DIAGNOSIS — I10 Essential (primary) hypertension: Secondary | ICD-10-CM | POA: Insufficient documentation

## 2012-01-23 DIAGNOSIS — Z3202 Encounter for pregnancy test, result negative: Secondary | ICD-10-CM | POA: Insufficient documentation

## 2012-01-23 DIAGNOSIS — R000 Tachycardia, unspecified: Secondary | ICD-10-CM | POA: Diagnosis not present

## 2012-01-23 DIAGNOSIS — M546 Pain in thoracic spine: Secondary | ICD-10-CM | POA: Diagnosis not present

## 2012-01-23 DIAGNOSIS — M549 Dorsalgia, unspecified: Secondary | ICD-10-CM | POA: Insufficient documentation

## 2012-01-23 DIAGNOSIS — Z87440 Personal history of urinary (tract) infections: Secondary | ICD-10-CM | POA: Insufficient documentation

## 2012-01-23 DIAGNOSIS — Z8659 Personal history of other mental and behavioral disorders: Secondary | ICD-10-CM | POA: Diagnosis not present

## 2012-01-23 MED ORDER — OXYCODONE-ACETAMINOPHEN 5-325 MG PO TABS: 1.0000 | ORAL_TABLET | Freq: Four times a day (QID) | ORAL | Status: DC | PRN

## 2012-01-23 NOTE — ED Notes (Signed)
Pt discharged. Pt stable at time of discharge. Medications reviewed pt has no questions regarding discharge at this time. Pt voiced understanding of discharge instructions.  
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
## 2012-01-23 NOTE — ED Notes (Signed)
Patient also reports intermittent chest tightness and dull ache.

## 2012-01-23 NOTE — ED Notes (Signed)
Pt requested urine preg before x ray

## 2012-01-23 NOTE — ED Notes (Signed)
Patient complaining of mid back pain constantly for approximately 4 days. Reports had similar symptoms about a month ago and took antibiotic for possible pneumonia? Reports cough with blood tinged sputum.

## 2012-01-23 NOTE — ED Notes (Signed)
Pt complains of back pain off and on for several months. States several weeks ago she had similar symptoms and was diagnosed with pneumonia and given antibiotics. Denies urinary symptoms. State she has an on and off cough and intermittent chest pain when she coughs.

## 2012-01-25 LAB — CBC
MCH: 31.7
MCHC: 35.6
RDW: 14
TSH: 0.74 u[IU]/mL (ref 0.41–5.90)
platelet count: 334

## 2012-01-27 DIAGNOSIS — Z6825 Body mass index (BMI) 25.0-25.9, adult: Secondary | ICD-10-CM | POA: Diagnosis not present

## 2012-01-27 DIAGNOSIS — Z79899 Other long term (current) drug therapy: Secondary | ICD-10-CM | POA: Diagnosis not present

## 2012-01-27 DIAGNOSIS — R109 Unspecified abdominal pain: Secondary | ICD-10-CM | POA: Diagnosis not present

## 2012-01-27 DIAGNOSIS — R209 Unspecified disturbances of skin sensation: Secondary | ICD-10-CM | POA: Diagnosis not present

## 2012-01-27 DIAGNOSIS — M549 Dorsalgia, unspecified: Secondary | ICD-10-CM | POA: Diagnosis not present

## 2012-01-27 LAB — COMPREHENSIVE METABOLIC PANEL
AST: 17 U/L
Albumin: 4.3
Alkaline Phosphatase: 59 U/L
Calcium: 9.5 mg/dL
Total Protein: 6.4 g/dL

## 2012-01-30 DIAGNOSIS — Z0000 Encounter for general adult medical examination without abnormal findings: Secondary | ICD-10-CM | POA: Diagnosis not present

## 2012-01-31 DIAGNOSIS — F429 Obsessive-compulsive disorder, unspecified: Secondary | ICD-10-CM | POA: Diagnosis not present

## 2012-01-31 DIAGNOSIS — F4001 Agoraphobia with panic disorder: Secondary | ICD-10-CM | POA: Diagnosis not present

## 2012-01-31 DIAGNOSIS — F909 Attention-deficit hyperactivity disorder, unspecified type: Secondary | ICD-10-CM | POA: Diagnosis not present

## 2012-02-08 DIAGNOSIS — F0630 Mood disorder due to known physiological condition, unspecified: Secondary | ICD-10-CM | POA: Diagnosis not present

## 2012-02-14 ENCOUNTER — Encounter: Payer: Self-pay | Admitting: Gastroenterology

## 2012-02-14 ENCOUNTER — Ambulatory Visit (INDEPENDENT_AMBULATORY_CARE_PROVIDER_SITE_OTHER): Payer: Medicare Other | Admitting: Gastroenterology

## 2012-02-14 VITALS — BP 147/96 | HR 119 | Ht 68.0 in | Wt 154.6 lb

## 2012-02-14 DIAGNOSIS — K625 Hemorrhage of anus and rectum: Secondary | ICD-10-CM

## 2012-02-14 DIAGNOSIS — R109 Unspecified abdominal pain: Secondary | ICD-10-CM | POA: Diagnosis not present

## 2012-02-14 MED ORDER — HYOSCYAMINE SULFATE ER 0.375 MG PO TB12: 0.3750 mg | ORAL_TABLET | Freq: Two times a day (BID) | ORAL | Status: DC | PRN

## 2012-02-14 MED ORDER — OMEPRAZOLE 20 MG PO CPDR: 20.0000 mg | DELAYED_RELEASE_CAPSULE | Freq: Every day | ORAL | Status: DC

## 2012-02-14 MED ORDER — PEG 3350-KCL-NA BICARB-NACL 420 G PO SOLR: 4000.0000 mL | ORAL | Status: DC

## 2012-02-14 NOTE — Patient Instructions (Addendum)
Stop all NSAIDs like Ibuprofen, Advil, Aleve, Mobic. You can take tylenol extra strength for pain.  I have sent a prescription for Prilosec to your pharmacy. Take this daily, 30 minutes before breakfast.   I have also sent a prescription for Levbid, an anti-spasmodic, to your pharmacy. This is for twice a day, scheduled. Please call us if we need to do something different.  We have set you up for a colonoscopy and upper endoscopy with Dr. Darrick Penna in the near future.
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
## 2012-02-15 ENCOUNTER — Other Ambulatory Visit: Payer: Self-pay | Admitting: Gastroenterology

## 2012-02-15 ENCOUNTER — Telehealth: Payer: Self-pay | Admitting: Gastroenterology

## 2012-02-15 DIAGNOSIS — K625 Hemorrhage of anus and rectum: Secondary | ICD-10-CM | POA: Insufficient documentation

## 2012-02-15 DIAGNOSIS — R1010 Upper abdominal pain, unspecified: Secondary | ICD-10-CM

## 2012-02-15 DIAGNOSIS — R109 Unspecified abdominal pain: Secondary | ICD-10-CM | POA: Insufficient documentation

## 2012-02-15 DIAGNOSIS — R634 Abnormal weight loss: Secondary | ICD-10-CM

## 2012-02-15 NOTE — Assessment & Plan Note (Signed)
TCS planned. Hgb stable.

## 2012-02-15 NOTE — Assessment & Plan Note (Addendum)
27 year old female with reported hx of chronic abdominal issues, now with constant, diffuse abdominal cramps, decreased po intake, unintentional weight loss, nausea and vomiting daily. Postprandial urgency to defecate, sometimes with no results. Intermittent cramping in abdomen, followed by bright-red blood. Alternating constipation and diarrhea. Reportable melena. Hgb normal as reviewed from [facility]. Notes vomiting undigested food a day later. Concern for evolving IBD, gastritis, PUD, gastroparesis, differentials vast. Needs upper and lower GI evaluation in near future. I'd like to obtain a CT prior, however.   CT abd/pelvis first Proceed with upper endoscopy and colonoscopy in the near future with Dr. Darrick Penna. The risks, benefits, and alternatives have been discussed in detail with patient. They have stated understanding and desire to proceed.  Start Prilosec daily Levbid for abdominal cramps AVOID NSAIDs

## 2012-02-15 NOTE — Telephone Encounter (Signed)
Patient is scheduled for CT scan on Tuesday Feb 4th at 3:00 and she is aware

## 2012-02-15 NOTE — Progress Notes (Signed)
Faxed to PCP

## 2012-02-15 NOTE — Telephone Encounter (Signed)
I saw patient in clinic yesterday. After further review of her records, I would like to obtain a CT abd/pelvis before we actually do the EGD/TCS. Needs urine pregnancy prior.   Please let pt know I want to assess with CT before doing those procedures. I briefly mentioned possibly doing that at the time of the visit.

## 2012-02-20 ENCOUNTER — Encounter (HOSPITAL_COMMUNITY): Payer: Self-pay | Admitting: Pharmacy Technician

## 2012-02-21 ENCOUNTER — Encounter (HOSPITAL_COMMUNITY): Payer: Self-pay

## 2012-02-21 ENCOUNTER — Ambulatory Visit (HOSPITAL_COMMUNITY)
Admission: RE | Admit: 2012-02-21 | Discharge: 2012-02-21 | Disposition: A | Discharge status: Home / Self Care | Payer: Medicare Other | Source: Ambulatory Visit | Attending: Gastroenterology | Admitting: Gastroenterology

## 2012-02-21 ENCOUNTER — Other Ambulatory Visit: Payer: Self-pay | Admitting: Gastroenterology

## 2012-02-21 ENCOUNTER — Other Ambulatory Visit: Payer: Self-pay

## 2012-02-21 DIAGNOSIS — R634 Abnormal weight loss: Secondary | ICD-10-CM | POA: Diagnosis not present

## 2012-02-21 DIAGNOSIS — R1010 Upper abdominal pain, unspecified: Secondary | ICD-10-CM

## 2012-02-21 DIAGNOSIS — Z139 Encounter for screening, unspecified: Secondary | ICD-10-CM | POA: Diagnosis not present

## 2012-02-21 DIAGNOSIS — K625 Hemorrhage of anus and rectum: Secondary | ICD-10-CM | POA: Insufficient documentation

## 2012-02-21 DIAGNOSIS — N200 Calculus of kidney: Secondary | ICD-10-CM | POA: Insufficient documentation

## 2012-02-21 DIAGNOSIS — R109 Unspecified abdominal pain: Secondary | ICD-10-CM | POA: Insufficient documentation

## 2012-02-21 MED ORDER — IOHEXOL 300 MG/ML  SOLN
100.0000 mL | Freq: Once | INTRAMUSCULAR | Status: AC | PRN
  Administered 2012-02-21: 100 mL via INTRAVENOUS

## 2012-02-22 DIAGNOSIS — F4001 Agoraphobia with panic disorder: Secondary | ICD-10-CM | POA: Diagnosis not present

## 2012-02-22 DIAGNOSIS — F909 Attention-deficit hyperactivity disorder, unspecified type: Secondary | ICD-10-CM | POA: Diagnosis not present

## 2012-02-22 DIAGNOSIS — F0630 Mood disorder due to known physiological condition, unspecified: Secondary | ICD-10-CM | POA: Diagnosis not present

## 2012-02-22 DIAGNOSIS — F429 Obsessive-compulsive disorder, unspecified: Secondary | ICD-10-CM | POA: Diagnosis not present

## 2012-02-27 NOTE — Progress Notes (Signed)
Quick Note:  LMOM to call. ______ 

## 2012-02-27 NOTE — Progress Notes (Signed)
Quick Note:  She has kidney stones, but these are non-obstructing. Does she have a history of this? She also has a contracted gallbladder. May be dealing with gallbladder disease.  Have her follow a low-fat diet. Still need to proceed with EGD/TCS, may end up with an [place] of abdomen in future.   ______

## 2012-02-28 NOTE — Progress Notes (Signed)
Quick Note:  Called and informed pt. She said she has never had kidney stones, but she has had a lot of kidney infections. Has a hx of renal failure. She would like to know if she can go ahead and do something about the stones now, before she has a serous attack. Please advise! ______

## 2012-02-29 ENCOUNTER — Telehealth: Payer: Self-pay | Admitting: Gastroenterology

## 2012-02-29 NOTE — Progress Notes (Signed)
Quick Note:  Called and informed pt. OK to send to urologist in [place]. ______

## 2012-02-29 NOTE — Progress Notes (Signed)
I have faxed Referral to [facility] in [place] and they will contact [name] and set her up with date & time

## 2012-02-29 NOTE — Progress Notes (Signed)
Quick Note:  Yes, we can send her to urology  Make sure she drinks plenty of water to stay hydrated. ______

## 2012-02-29 NOTE — Telephone Encounter (Signed)
Patient is scheduled with Dr. Retta Diones on Tuesday March 4th at 10:45 and Ms. Diguglielmo is aware

## 2012-03-05 ENCOUNTER — Encounter (HOSPITAL_COMMUNITY): Payer: Self-pay

## 2012-03-05 ENCOUNTER — Encounter (HOSPITAL_COMMUNITY)
Admission: RE | Disposition: A | Discharge status: Home / Self Care | Payer: Self-pay | Source: Ambulatory Visit | Attending: Gastroenterology

## 2012-03-05 ENCOUNTER — Ambulatory Visit (HOSPITAL_COMMUNITY)
Admission: RE | Admit: 2012-03-05 | Discharge: 2012-03-05 | Disposition: A | Discharge status: Home / Self Care | Payer: Medicare Other | Source: Ambulatory Visit | Attending: Gastroenterology | Admitting: Gastroenterology

## 2012-03-05 DIAGNOSIS — R109 Unspecified abdominal pain: Secondary | ICD-10-CM

## 2012-03-05 DIAGNOSIS — D126 Benign neoplasm of colon, unspecified: Secondary | ICD-10-CM | POA: Insufficient documentation

## 2012-03-05 DIAGNOSIS — K2990 Gastroduodenitis, unspecified, without bleeding: Secondary | ICD-10-CM

## 2012-03-05 DIAGNOSIS — K648 Other hemorrhoids: Secondary | ICD-10-CM | POA: Diagnosis not present

## 2012-03-05 DIAGNOSIS — I10 Essential (primary) hypertension: Secondary | ICD-10-CM | POA: Diagnosis not present

## 2012-03-05 DIAGNOSIS — K449 Diaphragmatic hernia without obstruction or gangrene: Secondary | ICD-10-CM | POA: Diagnosis not present

## 2012-03-05 DIAGNOSIS — K625 Hemorrhage of anus and rectum: Secondary | ICD-10-CM

## 2012-03-05 DIAGNOSIS — K2940 Chronic atrophic gastritis without bleeding: Secondary | ICD-10-CM | POA: Insufficient documentation

## 2012-03-05 DIAGNOSIS — D1330 Benign neoplasm of unspecified part of small intestine: Secondary | ICD-10-CM | POA: Diagnosis not present

## 2012-03-05 DIAGNOSIS — R1013 Epigastric pain: Secondary | ICD-10-CM

## 2012-03-05 DIAGNOSIS — K2970 Gastritis, unspecified, without bleeding: Secondary | ICD-10-CM | POA: Diagnosis not present

## 2012-03-05 HISTORY — DX: Other specified postprocedural states: Z98.890

## 2012-03-05 HISTORY — DX: Nausea with vomiting, unspecified: R11.2

## 2012-03-05 HISTORY — PX: COLONOSCOPY WITH ESOPHAGOGASTRODUODENOSCOPY (EGD): SHX5779

## 2012-03-05 SURGERY — COLONOSCOPY WITH ESOPHAGOGASTRODUODENOSCOPY (EGD)
Anesthesia: Moderate Sedation

## 2012-03-05 MED ORDER — MIDAZOLAM HCL 5 MG/5ML IJ SOLN
INTRAMUSCULAR | Status: AC
  Filled 2012-03-05: qty 10

## 2012-03-05 MED ORDER — STERILE WATER FOR IRRIGATION IR SOLN
Status: DC | PRN
  Administered 2012-03-05: 10:00:00

## 2012-03-05 MED ORDER — SODIUM CHLORIDE 0.45 % IV SOLN
INTRAVENOUS | Status: DC
  Administered 2012-03-05: 09:00:00 via INTRAVENOUS

## 2012-03-05 MED ORDER — PROMETHAZINE HCL 25 MG/ML IJ SOLN
12.5000 mg | Freq: Four times a day (QID) | INTRAMUSCULAR | Status: DC | PRN
  Administered 2012-03-05: 12.5 mg via INTRAVENOUS

## 2012-03-05 MED ORDER — MEPERIDINE HCL 100 MG/ML IJ SOLN
INTRAMUSCULAR | Status: AC
  Filled 2012-03-05: qty 2

## 2012-03-05 MED ORDER — BUTAMBEN-TETRACAINE-BENZOCAINE 2-2-14 % EX AERO
INHALATION_SPRAY | CUTANEOUS | Status: DC | PRN
  Administered 2012-03-05: 2 via TOPICAL

## 2012-03-05 MED ORDER — MIDAZOLAM HCL 5 MG/5ML IJ SOLN
INTRAMUSCULAR | Status: DC | PRN
  Administered 2012-03-05 (×2): 1 mg via INTRAVENOUS
  Administered 2012-03-05 (×3): 2 mg via INTRAVENOUS

## 2012-03-05 MED ORDER — HYDROCORTISONE 2.5 % RE CREA: TOPICAL_CREAM | Freq: Two times a day (BID) | RECTAL | Status: DC

## 2012-03-05 MED ORDER — PROMETHAZINE HCL 25 MG/ML IJ SOLN
INTRAMUSCULAR | Status: DC | PRN
  Administered 2012-03-05: 12.5 mg via INTRAVENOUS

## 2012-03-05 MED ORDER — PROMETHAZINE HCL 25 MG/ML IJ SOLN
INTRAMUSCULAR | Status: AC
  Filled 2012-03-05: qty 1

## 2012-03-05 MED ORDER — MEPERIDINE HCL 100 MG/ML IJ SOLN
INTRAMUSCULAR | Status: DC | PRN
  Administered 2012-03-05 (×2): 50 mg via INTRAVENOUS
  Administered 2012-03-05 (×2): 25 mg via INTRAVENOUS

## 2012-03-05 MED ORDER — SODIUM CHLORIDE 0.9 % IJ SOLN
INTRAMUSCULAR | Status: AC
  Filled 2012-03-05: qty 10

## 2012-03-05 NOTE — Op Note (Signed)
[facility] [address] [place], [address]   COLONOSCOPY PROCEDURE REPORT  PATIENT: [name], [name]  MR#: [id number] BIRTHDATE: 10-07-1985 , 27  yrs. old GENDER: Female ENDOSCOPIST: Jonette Eva, MD REFERRED QI:ONGEX Sherwood Gambler, M.D. PROCEDURE DATE:  03/05/2012 PROCEDURE:   Colonoscopy with snare polypectomy INDICATIONS:Rectal Bleeding.  PT DECLINED HEMORRHOID BANDING AT THIS TIME. MEDICATIONS: PREOP: PROMETHAZINE 12.5 MG IV, Demerol 125 mg IV, Versed 6 mg IV, and Promethazine 12.5mg  IV  DESCRIPTION OF PROCEDURE:    Physical exam was performed.  Informed consent was obtained from the patient after explaining the benefits, risks, and alternatives to procedure.  The patient was connected to monitor and placed in left lateral position. Continuous oxygen was provided by nasal cannula and IV medicine administered through an indwelling cannula.  After administration of sedation and rectal exam, the patients rectum was intubated and the EC-3890Li ([id number])  colonoscope was advanced under direct visualization to the ileum.  The scope was removed slowly by carefully examining the color, texture, anatomy, and integrity mucosa on the way out.  The patient was recovered in endoscopy and discharged home in satisfactory condition.    COLON FINDINGS: The mucosa appeared normal in the terminal ileum.  , A smooth sessile polyp measuring 6 mm in size was found in the descending colon.  A polypectomy was performed using snare cautery. , and Moderate sized internal hemorrhoids were found.  PREP QUALITY: good. CECAL W/D TIME: 12 minutes  COMPLICATIONS: None  ENDOSCOPIC IMPRESSION: 1.   Normal mucosa in the terminal ileum 2.  Polyp in the descending colon 3.   Moderate sized internal hemorrhoids-DEFINITE CAUSE FOR RECTAL BLEEDING  RECOMMENDATIONS: ANUSOL CREAM BID FOR 12 DAYS.  REPEAT X1 IF SYMPTOMS NOT COMPLETELY RESOLVED. AVOID ITEMS THAT TRIGGER GASTRITIS. FOLLOW  A HIGH FIBER/LOW FAT DIET.  AVOID ITEMS THAT CAUSE BLOATING.  BIOPSY RESULTS WILL BE BACK IN 7 DAYS. OPV IN 2 MOS       _______________________________ eSignedJonette Eva, MD 03/05/2012 3:05 PM     PATIENT NAME:  [name], [name] MR#: [id number]
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
## 2012-03-05 NOTE — Op Note (Signed)
[facility] [address] [place], [address]   ENDOSCOPY PROCEDURE REPORT  PATIENT: [name], [name]  MR#: [id number] BIRTHDATE: 01/03/1986 , 27  yrs. old GENDER: Female  ENDOSCOPIST: Jonette Eva, MD REFERRED QM:VHQIO Sherwood Gambler, M.D. PROCEDURE DATE: 03/05/2012 PROCEDURE:   EGD w/ biopsy  INDICATIONS:Epigastric pain.   WEIGHT LOSS. FEB 2014: CT A/P W/ IVC-NAIAP MEDICATIONS:  TCS+ Demerol 25 mg IV and Versed 1mg  IV TOPICAL ANESTHETIC:   Cetacaine Spray  DESCRIPTION OF PROCEDURE:     Physical exam was performed.  Informed consent was obtained from the patient after explaining the benefits, risks, and alternatives to the procedure.  The patient was connected to the monitor and placed in the left lateral position.  Continuous oxygen was provided by nasal cannula and IV medicine administered through an indwelling cannula.  After administration of sedation, the patients esophagus was intubated and the EC-3890Li ([id number]) and EG-2990i ([id number])  endoscope was advanced under direct visualization to the second portion of the duodenum.  The scope was removed slowly by carefully examining the color, texture, anatomy, and integrity of the mucosa on the way out.  The patient was recovered in endoscopy and discharged home in satisfactory condition.   ESOPHAGUS: The mucosa of the esophagus appeared normal.   A small hiatal hernia was noted.   STOMACH: Mild erosive gastritis (inflammation) was found in the gastric antrum.  Multiple biopsies were performed using cold forceps.   DUODENUM: The duodenal mucosa showed no abnormalities in the bulb and second portion of the duodenum.  Cold forcep biopsies were taken in the second portion.   COMPLICATIONS:   None  ENDOSCOPIC IMPRESSION: 1.   The mucosa of the esophagus appeared normal 2.   Small hiatal hernia 3.   MILD Erosive gastritis 4. ABDOMINAL PAIN MOST LIKELY DUE TO MANY FACTORS: GASTRITIS, IBS-MIXED PATTERN,  PSYCHO-SOCIAL STRESSORS  RECOMMENDATIONS: AWAIT BIOPSY LOW FAT/HIGH FIBER DIET CONTINUE OMEPRAZOLE OPV IN 2 MOS   REPEAT EXAM:   _______________________________ Rosalie DoctorJonette Eva, MD 03/05/2012 2:53 PM       PATIENT NAME:  [name], [name] MR#: [id number]

## 2012-03-07 ENCOUNTER — Encounter (HOSPITAL_COMMUNITY): Payer: Self-pay | Admitting: Gastroenterology

## 2012-03-09 ENCOUNTER — Telehealth: Payer: Self-pay | Admitting: Gastroenterology

## 2012-03-09 NOTE — Telephone Encounter (Signed)
Please call pt. HER stomach Bx shows gastritis. She had A HYPERPLASTIC POLYP removed from her colon. HER ABDOMINAL PAIN/WEIGHT LOSS IS MOST LIKELY DUE TO GASTRITIS, IBS, REFLUX, AND STRESS.   CONTINUE OMEPRAZOLE. USE LEVBID PRN ABD CRAMPS AVOID ITEMS THAT TRIGGER GASTRITIS.  FOLLOW A HIGH FIBER/LOW FAT DIET. AVOID ITEMS THAT CAUSE BLOATING.  FOLLOW IN 4 MOS W/ AS.

## 2012-03-12 NOTE — Telephone Encounter (Signed)
LMOM to call.

## 2012-03-13 NOTE — Telephone Encounter (Signed)
Path faxed to PCP, recall made 

## 2012-03-14 NOTE — Telephone Encounter (Signed)
Called and informed pt of results. She said the Levbid is not helping the cramps. She still cramps very bad even when she takes it. She is also concerned about the hiatal hernia and said she hurts in that area a lot and she wants to know what is recommended for that. Please advise!

## 2012-03-15 NOTE — Telephone Encounter (Signed)
PLEASE CALL PT.  IF SHE IS STI;; HAVING CRAMPS, SHE SHOULD TAKE THE LEVBID BID AND NOT PRN FOR THE NEXT 5 DAYS. HER HIATAL HERNIA DOES NOT CAUSE PAIN. IT JUST MEANS SHE WILL BE MORE LIKELY TO HAVE REFLUX IF SHE EATS THE WRONG THING. SHE SHOULD FOLLOW A HIGH FIBER/LOW FAT DIET.

## 2012-03-15 NOTE — Telephone Encounter (Signed)
Called and informed pt.  

## 2012-03-20 DIAGNOSIS — M549 Dorsalgia, unspecified: Secondary | ICD-10-CM | POA: Diagnosis not present

## 2012-03-20 DIAGNOSIS — F17200 Nicotine dependence, unspecified, uncomplicated: Secondary | ICD-10-CM | POA: Diagnosis not present

## 2012-03-20 DIAGNOSIS — R0989 Other specified symptoms and signs involving the circulatory and respiratory systems: Secondary | ICD-10-CM | POA: Diagnosis not present

## 2012-03-20 DIAGNOSIS — R091 Pleurisy: Secondary | ICD-10-CM | POA: Diagnosis not present

## 2012-03-20 DIAGNOSIS — M546 Pain in thoracic spine: Secondary | ICD-10-CM | POA: Diagnosis not present

## 2012-03-20 DIAGNOSIS — F909 Attention-deficit hyperactivity disorder, unspecified type: Secondary | ICD-10-CM | POA: Diagnosis not present

## 2012-03-20 DIAGNOSIS — Z79899 Other long term (current) drug therapy: Secondary | ICD-10-CM | POA: Diagnosis not present

## 2012-03-20 DIAGNOSIS — R0609 Other forms of dyspnea: Secondary | ICD-10-CM | POA: Diagnosis not present

## 2012-03-20 DIAGNOSIS — R079 Chest pain, unspecified: Secondary | ICD-10-CM | POA: Diagnosis not present

## 2012-04-12 ENCOUNTER — Institutional Professional Consult (permissible substitution): Payer: Self-pay | Admitting: Diagnostic Neuroimaging

## 2012-04-30 ENCOUNTER — Encounter: Payer: Self-pay | Admitting: Gastroenterology

## 2012-05-01 DIAGNOSIS — F909 Attention-deficit hyperactivity disorder, unspecified type: Secondary | ICD-10-CM | POA: Diagnosis not present

## 2012-05-01 DIAGNOSIS — F0630 Mood disorder due to known physiological condition, unspecified: Secondary | ICD-10-CM | POA: Diagnosis not present

## 2012-05-01 DIAGNOSIS — F4001 Agoraphobia with panic disorder: Secondary | ICD-10-CM | POA: Diagnosis not present

## 2012-05-24 ENCOUNTER — Encounter: Payer: Self-pay | Admitting: Gastroenterology

## 2012-05-24 NOTE — Progress Notes (Signed)
Feb 2014 CT AP NAIAP EGD TCS GASTRITIS, NL DUO BX, HYPERPLASTIC POLYPS  REVIEWED.

## 2012-06-11 DIAGNOSIS — R093 Abnormal sputum: Secondary | ICD-10-CM | POA: Diagnosis not present

## 2012-06-11 DIAGNOSIS — R059 Cough, unspecified: Secondary | ICD-10-CM | POA: Diagnosis not present

## 2012-06-11 DIAGNOSIS — I498 Other specified cardiac arrhythmias: Secondary | ICD-10-CM | POA: Diagnosis not present

## 2012-06-11 DIAGNOSIS — R000 Tachycardia, unspecified: Secondary | ICD-10-CM | POA: Diagnosis not present

## 2012-06-11 DIAGNOSIS — R0602 Shortness of breath: Secondary | ICD-10-CM | POA: Diagnosis not present

## 2012-06-11 DIAGNOSIS — R042 Hemoptysis: Secondary | ICD-10-CM | POA: Diagnosis not present

## 2012-06-11 DIAGNOSIS — R05 Cough: Secondary | ICD-10-CM | POA: Diagnosis not present

## 2012-06-11 DIAGNOSIS — J189 Pneumonia, unspecified organism: Secondary | ICD-10-CM | POA: Diagnosis not present

## 2012-06-11 DIAGNOSIS — R0989 Other specified symptoms and signs involving the circulatory and respiratory systems: Secondary | ICD-10-CM | POA: Diagnosis not present

## 2012-06-11 NOTE — ED Notes (Signed)
 Pt states she is a smoker and has been coughing up blood and feeling SOB for the past two days

## 2012-06-11 NOTE — Telephone Encounter (Signed)
 Source:  internet//HOC//Per PAL:  Coughing up blood specks / really bad chest pain / hx of tongue and mouth swelling / will all tests needed be done if she comes to the ER / lots of trouble breathing // daw Per patient [phone]):  27 y/o with history of blood clot in arm from IV  states she has been seen in the local ED more than once over the past few weeks with normal CXR, dx: possible pleurisy.  She has had blood in her sputum for the past 2 weeks, worse in the past few days.  States she has continuous chest pain and difficulty of breathing, this has been going on for a while as well.  Care Advice per Cough Guideline:  recommends ED evaluation, she will come to [facility] ED, advised not to drive.

## 2012-06-11 NOTE — ED Notes (Signed)
Pt taken to ct scanner with tech

## 2012-06-12 DIAGNOSIS — R0609 Other forms of dyspnea: Secondary | ICD-10-CM | POA: Diagnosis not present

## 2012-06-12 DIAGNOSIS — R042 Hemoptysis: Secondary | ICD-10-CM | POA: Diagnosis not present

## 2012-06-12 DIAGNOSIS — R0989 Other specified symptoms and signs involving the circulatory and respiratory systems: Secondary | ICD-10-CM | POA: Diagnosis not present

## 2012-06-12 DIAGNOSIS — R000 Tachycardia, unspecified: Secondary | ICD-10-CM | POA: Diagnosis not present

## 2012-06-12 NOTE — ED Notes (Signed)
 MD at bedside.

## 2012-06-12 NOTE — ED Notes (Signed)
 Pt back from ct scanner with tech

## 2012-06-12 NOTE — ED Notes (Signed)
 Pt verbalized understandings of discharge instructions. Pt has no complaints at this time.

## 2012-06-20 ENCOUNTER — Emergency Department (HOSPITAL_COMMUNITY): Payer: Medicare Other

## 2012-06-20 ENCOUNTER — Encounter (HOSPITAL_COMMUNITY): Payer: Self-pay | Admitting: *Deleted

## 2012-06-20 ENCOUNTER — Emergency Department (HOSPITAL_COMMUNITY)
Admission: EM | Admit: 2012-06-20 | Discharge: 2012-06-21 | Disposition: A | Discharge status: Home / Self Care | Payer: Medicare Other | Attending: Emergency Medicine | Admitting: Emergency Medicine

## 2012-06-20 DIAGNOSIS — Y939 Activity, unspecified: Secondary | ICD-10-CM | POA: Insufficient documentation

## 2012-06-20 DIAGNOSIS — Y929 Unspecified place or not applicable: Secondary | ICD-10-CM | POA: Insufficient documentation

## 2012-06-20 DIAGNOSIS — R51 Headache: Secondary | ICD-10-CM | POA: Diagnosis not present

## 2012-06-20 DIAGNOSIS — Z87448 Personal history of other diseases of urinary system: Secondary | ICD-10-CM | POA: Diagnosis not present

## 2012-06-20 DIAGNOSIS — R6889 Other general symptoms and signs: Secondary | ICD-10-CM | POA: Diagnosis not present

## 2012-06-20 DIAGNOSIS — F17200 Nicotine dependence, unspecified, uncomplicated: Secondary | ICD-10-CM | POA: Insufficient documentation

## 2012-06-20 DIAGNOSIS — Z880 Allergy status to penicillin: Secondary | ICD-10-CM | POA: Insufficient documentation

## 2012-06-20 DIAGNOSIS — M546 Pain in thoracic spine: Secondary | ICD-10-CM | POA: Diagnosis not present

## 2012-06-20 DIAGNOSIS — Z79899 Other long term (current) drug therapy: Secondary | ICD-10-CM | POA: Diagnosis not present

## 2012-06-20 DIAGNOSIS — F429 Obsessive-compulsive disorder, unspecified: Secondary | ICD-10-CM | POA: Insufficient documentation

## 2012-06-20 DIAGNOSIS — G8929 Other chronic pain: Secondary | ICD-10-CM | POA: Diagnosis not present

## 2012-06-20 DIAGNOSIS — I10 Essential (primary) hypertension: Secondary | ICD-10-CM | POA: Insufficient documentation

## 2012-06-20 DIAGNOSIS — Z87828 Personal history of other (healed) physical injury and trauma: Secondary | ICD-10-CM | POA: Insufficient documentation

## 2012-06-20 DIAGNOSIS — Z8701 Personal history of pneumonia (recurrent): Secondary | ICD-10-CM | POA: Insufficient documentation

## 2012-06-20 DIAGNOSIS — E860 Dehydration: Secondary | ICD-10-CM | POA: Diagnosis not present

## 2012-06-20 DIAGNOSIS — R042 Hemoptysis: Secondary | ICD-10-CM | POA: Diagnosis not present

## 2012-06-20 DIAGNOSIS — X30XXXA Exposure to excessive natural heat, initial encounter: Secondary | ICD-10-CM | POA: Insufficient documentation

## 2012-06-20 LAB — URINE MICROSCOPIC-ADD ON

## 2012-06-20 LAB — URINALYSIS, ROUTINE W REFLEX MICROSCOPIC
Glucose, UA: NEGATIVE mg/dL
Hgb urine dipstick: NEGATIVE
Ketones, ur: NEGATIVE mg/dL
Protein, ur: 30 mg/dL — AB
pH: 7 (ref 5.0–8.0)

## 2012-06-20 LAB — CBC WITH DIFFERENTIAL/PLATELET
Basophils Absolute: 0 10*3/uL (ref 0.0–0.1)
Basophils Relative: 0 % (ref 0–1)
MCHC: 36.3 g/dL — ABNORMAL HIGH (ref 30.0–36.0)
Neutro Abs: 17.2 10*3/uL — ABNORMAL HIGH (ref 1.7–7.7)
Neutrophils Relative %: 86 % — ABNORMAL HIGH (ref 43–77)
Platelets: 351 10*3/uL (ref 150–400)
RDW: 12.4 % (ref 11.5–15.5)

## 2012-06-20 LAB — COMPREHENSIVE METABOLIC PANEL
AST: 17 U/L (ref 0–37)
Albumin: 4.5 g/dL (ref 3.5–5.2)
Alkaline Phosphatase: 60 U/L (ref 39–117)
Chloride: 104 mEq/L (ref 96–112)
Potassium: 3.4 mEq/L — ABNORMAL LOW (ref 3.5–5.1)
Total Bilirubin: 0.3 mg/dL (ref 0.3–1.2)

## 2012-06-20 LAB — RAPID URINE DRUG SCREEN, HOSP PERFORMED
Amphetamines: POSITIVE — AB
Barbiturates: NOT DETECTED

## 2012-06-20 MED ORDER — FENTANYL CITRATE 0.05 MG/ML IJ SOLN
100.0000 ug | Freq: Once | INTRAMUSCULAR | Status: AC
  Administered 2012-06-20: 100 ug via INTRAVENOUS
  Filled 2012-06-20: qty 2

## 2012-06-20 MED ORDER — KETOROLAC TROMETHAMINE 30 MG/ML IJ SOLN
30.0000 mg | Freq: Once | INTRAMUSCULAR | Status: AC
  Administered 2012-06-20: 30 mg via INTRAVENOUS
  Filled 2012-06-20: qty 1

## 2012-06-20 MED ORDER — NAPROXEN 500 MG PO TABS: 500.0000 mg | ORAL_TABLET | Freq: Two times a day (BID) | ORAL | Status: DC

## 2012-06-20 MED ORDER — METOCLOPRAMIDE HCL 5 MG/ML IJ SOLN
10.0000 mg | Freq: Once | INTRAMUSCULAR | Status: AC
  Administered 2012-06-20: 10 mg via INTRAVENOUS
  Filled 2012-06-20: qty 2

## 2012-06-20 MED ORDER — SODIUM CHLORIDE 0.9 % IV BOLUS (SEPSIS)
1000.0000 mL | Freq: Once | INTRAVENOUS | Status: AC
  Administered 2012-06-20: 1000 mL via INTRAVENOUS

## 2012-06-20 MED ORDER — CYCLOBENZAPRINE HCL 10 MG PO TABS: 10.0000 mg | ORAL_TABLET | Freq: Two times a day (BID) | ORAL | Status: DC | PRN

## 2012-06-20 MED ORDER — DIPHENHYDRAMINE HCL 50 MG/ML IJ SOLN
25.0000 mg | Freq: Once | INTRAMUSCULAR | Status: AC
  Administered 2012-06-20: 25 mg via INTRAVENOUS
  Filled 2012-06-20: qty 1

## 2012-06-20 NOTE — ED Notes (Signed)
MD at bedside. Dr Adriana Simas discussing plan of care with said pt. And plan of care.
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
## 2012-06-20 NOTE — ED Notes (Signed)
Pt states " migraine cocktail will not help me". Pt c/o lower back pain at this time. Father of said pt at bedside reveling pts ordeal with drug and alcohol addiction. Father states
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
## 2012-06-21 ENCOUNTER — Ambulatory Visit (HOSPITAL_COMMUNITY)
Admit: 2012-06-21 | Discharge: 2012-06-21 | Disposition: A | Discharge status: Home / Self Care | Payer: Medicare Other | Source: Ambulatory Visit | Attending: Emergency Medicine | Admitting: Emergency Medicine

## 2012-06-21 ENCOUNTER — Encounter (HOSPITAL_COMMUNITY): Payer: Self-pay

## 2012-06-21 DIAGNOSIS — M5124 Other intervertebral disc displacement, thoracic region: Secondary | ICD-10-CM | POA: Diagnosis not present

## 2012-06-21 DIAGNOSIS — M5144 Schmorl's nodes, thoracic region: Secondary | ICD-10-CM | POA: Diagnosis not present

## 2012-06-21 DIAGNOSIS — M546 Pain in thoracic spine: Secondary | ICD-10-CM | POA: Diagnosis not present

## 2012-06-21 NOTE — ED Notes (Signed)
Pt alert & oriented x4, stable gait. Patient given discharge instructions, paperwork & prescription(s). Patient  instructed to stop at the registration desk to finish any additional paperwork. Patient verbalized understanding. Pt left department w/ no further questions. 

## 2012-07-04 ENCOUNTER — Encounter: Payer: Self-pay | Admitting: Gastroenterology

## 2012-08-19 ENCOUNTER — Emergency Department (HOSPITAL_COMMUNITY): Payer: Medicare Other

## 2012-08-19 ENCOUNTER — Emergency Department (HOSPITAL_COMMUNITY)
Admission: EM | Admit: 2012-08-19 | Discharge: 2012-08-19 | Disposition: A | Discharge status: Home / Self Care | Payer: Medicare Other | Attending: Emergency Medicine | Admitting: Emergency Medicine

## 2012-08-19 ENCOUNTER — Encounter (HOSPITAL_COMMUNITY): Payer: Self-pay | Admitting: *Deleted

## 2012-08-19 DIAGNOSIS — Z79899 Other long term (current) drug therapy: Secondary | ICD-10-CM | POA: Insufficient documentation

## 2012-08-19 DIAGNOSIS — R071 Chest pain on breathing: Secondary | ICD-10-CM | POA: Insufficient documentation

## 2012-08-19 DIAGNOSIS — S298XXA Other specified injuries of thorax, initial encounter: Secondary | ICD-10-CM | POA: Diagnosis not present

## 2012-08-19 DIAGNOSIS — Z8659 Personal history of other mental and behavioral disorders: Secondary | ICD-10-CM | POA: Insufficient documentation

## 2012-08-19 DIAGNOSIS — F1010 Alcohol abuse, uncomplicated: Secondary | ICD-10-CM

## 2012-08-19 DIAGNOSIS — Y92410 Unspecified street and highway as the place of occurrence of the external cause: Secondary | ICD-10-CM | POA: Insufficient documentation

## 2012-08-19 DIAGNOSIS — IMO0002 Reserved for concepts with insufficient information to code with codable children: Secondary | ICD-10-CM | POA: Diagnosis not present

## 2012-08-19 DIAGNOSIS — S3981XA Other specified injuries of abdomen, initial encounter: Secondary | ICD-10-CM | POA: Diagnosis not present

## 2012-08-19 DIAGNOSIS — T148XXA Other injury of unspecified body region, initial encounter: Secondary | ICD-10-CM | POA: Diagnosis not present

## 2012-08-19 DIAGNOSIS — R4182 Altered mental status, unspecified: Secondary | ICD-10-CM | POA: Insufficient documentation

## 2012-08-19 DIAGNOSIS — I10 Essential (primary) hypertension: Secondary | ICD-10-CM | POA: Insufficient documentation

## 2012-08-19 DIAGNOSIS — F17200 Nicotine dependence, unspecified, uncomplicated: Secondary | ICD-10-CM | POA: Insufficient documentation

## 2012-08-19 DIAGNOSIS — Y9389 Activity, other specified: Secondary | ICD-10-CM | POA: Insufficient documentation

## 2012-08-19 DIAGNOSIS — F411 Generalized anxiety disorder: Secondary | ICD-10-CM | POA: Insufficient documentation

## 2012-08-19 DIAGNOSIS — T1490XA Injury, unspecified, initial encounter: Secondary | ICD-10-CM | POA: Diagnosis not present

## 2012-08-19 DIAGNOSIS — Z3202 Encounter for pregnancy test, result negative: Secondary | ICD-10-CM | POA: Insufficient documentation

## 2012-08-19 DIAGNOSIS — S0993XA Unspecified injury of face, initial encounter: Secondary | ICD-10-CM | POA: Diagnosis not present

## 2012-08-19 DIAGNOSIS — Z87448 Personal history of other diseases of urinary system: Secondary | ICD-10-CM | POA: Insufficient documentation

## 2012-08-19 DIAGNOSIS — S0990XA Unspecified injury of head, initial encounter: Secondary | ICD-10-CM | POA: Diagnosis not present

## 2012-08-19 LAB — CBC
HCT: 40.4 % (ref 36.0–46.0)
MCV: 91.6 fL (ref 78.0–100.0)
Platelets: 297 10*3/uL (ref 150–400)
RBC: 4.41 MIL/uL (ref 3.87–5.11)
WBC: 11.6 10*3/uL — ABNORMAL HIGH (ref 4.0–10.5)

## 2012-08-19 LAB — COMPREHENSIVE METABOLIC PANEL
AST: 17 U/L (ref 0–37)
Alkaline Phosphatase: 55 U/L (ref 39–117)
CO2: 29 mEq/L (ref 19–32)
Chloride: 103 mEq/L (ref 96–112)
Creatinine, Ser: 0.63 mg/dL (ref 0.50–1.10)
GFR calc non Af Amer: >90 mL/min (ref >90)
Potassium: 3.9 mEq/L (ref 3.5–5.1)
Total Bilirubin: 0.3 mg/dL (ref 0.3–1.2)

## 2012-08-19 LAB — URINALYSIS, ROUTINE W REFLEX MICROSCOPIC
Glucose, UA: NEGATIVE mg/dL
Hgb urine dipstick: NEGATIVE
Specific Gravity, Urine: 1.01 (ref 1.005–1.030)
pH: 6 (ref 5.0–8.0)

## 2012-08-19 LAB — GLUCOSE, CAPILLARY: Glucose-Capillary: 81 mg/dL (ref 70–99)

## 2012-08-19 LAB — RAPID URINE DRUG SCREEN, HOSP PERFORMED
Amphetamines: NOT DETECTED
Barbiturates: NOT DETECTED
Benzodiazepines: POSITIVE — AB
Tetrahydrocannabinol: POSITIVE — AB

## 2012-08-19 MED ORDER — HALOPERIDOL LACTATE 5 MG/ML IJ SOLN
5.0000 mg | Freq: Once | INTRAMUSCULAR | Status: AC
  Administered 2012-08-19: 5 mg via INTRAMUSCULAR
  Filled 2012-08-19: qty 1

## 2012-08-19 MED ORDER — IOHEXOL 300 MG/ML  SOLN
100.0000 mL | Freq: Once | INTRAMUSCULAR | Status: AC | PRN
  Administered 2012-08-19: 100 mL via INTRAVENOUS

## 2012-08-19 MED ORDER — LORAZEPAM 2 MG/ML IJ SOLN
1.0000 mg | Freq: Once | INTRAMUSCULAR | Status: AC
  Administered 2012-08-19: 1 mg via INTRAVENOUS
  Filled 2012-08-19: qty 1

## 2012-08-19 NOTE — ED Notes (Signed)
Pt sitting up in bed, alert and oriented x 3 at this time, still drowsy but awake to touch.  Pt denies attempt to hurt self.  Denies SI/HI.  Given Diet Coke to drink per pt request.  nad noted.  Family at bedside.  edp notified.

## 2012-08-19 NOTE — ED Notes (Signed)
All jewelry and belongings returned to pt.  Pt getting dressed.

## 2012-08-19 NOTE — ED Notes (Addendum)
Pt has been aggressive, combative at times, unable to understand/follow commands since arrival.  Pt has incomprehensible speech.  Pt is lethargic, drowsy, but arousable upon stimulation.  Unable to obtain CTs due to pt being aggressive, combative and attempting to get off table.  EDP notified and Ativan given.  Pt still combative, fighting with staff.  Unable to keep pt still to obtain CTs.  EDP notified.  Haldol orders received.  Pt brought back to ED.  Will attempt CTs after Haldol takes effect.  Pt calm at this time, resting, NSR on monitor.  Upon arrival, blue and white braided star of life bracelet removed along with a black watch, clear coiled earring with orange and blue swirls through center, brown string necklace with silver colored flower charm with diamond like stone in center, silver colored chain necklace, multicolored round nose ring with ball, silver colored horseshoe like ring, silver ear bar bell, silver colored earring, (1) with brown colored stone.  Pt still has belly ring and tongue ring in place, unable to remove due to pt being combative.  Jewelry labeled and placed in biohazard bag and placed with pt's belongings.
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
## 2012-08-19 NOTE — ED Notes (Signed)
Pt has bruising noted to left breast and left anterior shoulder.  No other bruising/abrasions/deformities noted.  Pt very agitated, attempting to sit up from LSB, attempting to remove c-collar.  Does not to follow commands,  Speech incomprehensible, alert to self only when called by name.
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
## 2012-08-20 LAB — URINE CULTURE
Colony Count: NO GROWTH
Culture: NO GROWTH

## 2012-08-25 ENCOUNTER — Emergency Department (HOSPITAL_COMMUNITY): Payer: Medicare Other

## 2012-08-25 ENCOUNTER — Emergency Department (HOSPITAL_COMMUNITY)
Admission: EM | Admit: 2012-08-25 | Discharge: 2012-08-25 | Disposition: A | Discharge status: Home / Self Care | Payer: Medicare Other | Attending: Emergency Medicine | Admitting: Emergency Medicine

## 2012-08-25 ENCOUNTER — Encounter (HOSPITAL_COMMUNITY): Payer: Self-pay | Admitting: *Deleted

## 2012-08-25 DIAGNOSIS — R071 Chest pain on breathing: Secondary | ICD-10-CM | POA: Insufficient documentation

## 2012-08-25 DIAGNOSIS — I10 Essential (primary) hypertension: Secondary | ICD-10-CM | POA: Insufficient documentation

## 2012-08-25 DIAGNOSIS — F429 Obsessive-compulsive disorder, unspecified: Secondary | ICD-10-CM | POA: Diagnosis not present

## 2012-08-25 DIAGNOSIS — S20219A Contusion of unspecified front wall of thorax, initial encounter: Secondary | ICD-10-CM | POA: Diagnosis not present

## 2012-08-25 DIAGNOSIS — F17200 Nicotine dependence, unspecified, uncomplicated: Secondary | ICD-10-CM | POA: Diagnosis not present

## 2012-08-25 DIAGNOSIS — G8929 Other chronic pain: Secondary | ICD-10-CM | POA: Insufficient documentation

## 2012-08-25 DIAGNOSIS — Z79899 Other long term (current) drug therapy: Secondary | ICD-10-CM | POA: Insufficient documentation

## 2012-08-25 DIAGNOSIS — S20211D Contusion of right front wall of thorax, subsequent encounter: Secondary | ICD-10-CM

## 2012-08-25 DIAGNOSIS — K089 Disorder of teeth and supporting structures, unspecified: Secondary | ICD-10-CM | POA: Diagnosis not present

## 2012-08-25 DIAGNOSIS — G8911 Acute pain due to trauma: Secondary | ICD-10-CM | POA: Diagnosis not present

## 2012-08-25 DIAGNOSIS — Z87448 Personal history of other diseases of urinary system: Secondary | ICD-10-CM | POA: Diagnosis not present

## 2012-08-25 DIAGNOSIS — Z880 Allergy status to penicillin: Secondary | ICD-10-CM | POA: Insufficient documentation

## 2012-08-25 DIAGNOSIS — R059 Cough, unspecified: Secondary | ICD-10-CM | POA: Insufficient documentation

## 2012-08-25 DIAGNOSIS — R079 Chest pain, unspecified: Secondary | ICD-10-CM | POA: Diagnosis not present

## 2012-08-25 DIAGNOSIS — S298XXA Other specified injuries of thorax, initial encounter: Secondary | ICD-10-CM | POA: Diagnosis not present

## 2012-08-25 DIAGNOSIS — T148XXA Other injury of unspecified body region, initial encounter: Secondary | ICD-10-CM

## 2012-08-25 DIAGNOSIS — R05 Cough: Secondary | ICD-10-CM | POA: Insufficient documentation

## 2012-08-25 DIAGNOSIS — F411 Generalized anxiety disorder: Secondary | ICD-10-CM | POA: Insufficient documentation

## 2012-08-25 MED ORDER — HYDROCODONE-ACETAMINOPHEN 5-325 MG PO TABS
1.0000 | ORAL_TABLET | Freq: Once | ORAL | Status: AC
  Administered 2012-08-25: 1 via ORAL
  Filled 2012-08-25: qty 1

## 2012-08-25 MED ORDER — IBUPROFEN 600 MG PO TABS: 600.0000 mg | ORAL_TABLET | Freq: Four times a day (QID) | ORAL | Status: DC | PRN

## 2012-08-25 MED ORDER — IBUPROFEN 800 MG PO TABS
800.0000 mg | ORAL_TABLET | Freq: Once | ORAL | Status: AC
  Administered 2012-08-25: 800 mg via ORAL
  Filled 2012-08-25: qty 1

## 2012-08-25 MED ORDER — HYDROCODONE-ACETAMINOPHEN 5-325 MG PO TABS: 1.0000 | ORAL_TABLET | ORAL | Status: DC | PRN
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
## 2012-08-29 NOTE — ED Provider Notes (Signed)
Medical screening examination/treatment/procedure(s) were performed by non-physician practitioner and as supervising physician I was immediately available for consultation/collaboration.] Devoria Albe, MD, Franz Dell, MD 08/29/12 2356

## 2012-09-12 ENCOUNTER — Emergency Department (HOSPITAL_COMMUNITY)
Admission: EM | Admit: 2012-09-12 | Discharge: 2012-09-12 | Disposition: A | Discharge status: Home / Self Care | Payer: Medicare Other | Attending: Emergency Medicine | Admitting: Emergency Medicine

## 2012-09-12 ENCOUNTER — Encounter (HOSPITAL_COMMUNITY): Payer: Self-pay | Admitting: *Deleted

## 2012-09-12 DIAGNOSIS — F17200 Nicotine dependence, unspecified, uncomplicated: Secondary | ICD-10-CM | POA: Diagnosis not present

## 2012-09-12 DIAGNOSIS — R209 Unspecified disturbances of skin sensation: Secondary | ICD-10-CM | POA: Insufficient documentation

## 2012-09-12 DIAGNOSIS — R064 Hyperventilation: Secondary | ICD-10-CM | POA: Diagnosis not present

## 2012-09-12 DIAGNOSIS — Z79899 Other long term (current) drug therapy: Secondary | ICD-10-CM | POA: Diagnosis not present

## 2012-09-12 DIAGNOSIS — F410 Panic disorder [episodic paroxysmal anxiety] without agoraphobia: Secondary | ICD-10-CM | POA: Diagnosis not present

## 2012-09-12 DIAGNOSIS — R42 Dizziness and giddiness: Secondary | ICD-10-CM | POA: Insufficient documentation

## 2012-09-12 DIAGNOSIS — Z8659 Personal history of other mental and behavioral disorders: Secondary | ICD-10-CM | POA: Diagnosis not present

## 2012-09-12 DIAGNOSIS — R0602 Shortness of breath: Secondary | ICD-10-CM | POA: Insufficient documentation

## 2012-09-12 DIAGNOSIS — F411 Generalized anxiety disorder: Secondary | ICD-10-CM | POA: Diagnosis not present

## 2012-09-12 DIAGNOSIS — Z3202 Encounter for pregnancy test, result negative: Secondary | ICD-10-CM | POA: Diagnosis not present

## 2012-09-12 DIAGNOSIS — M549 Dorsalgia, unspecified: Secondary | ICD-10-CM | POA: Diagnosis not present

## 2012-09-12 DIAGNOSIS — M62838 Other muscle spasm: Secondary | ICD-10-CM | POA: Insufficient documentation

## 2012-09-12 DIAGNOSIS — I10 Essential (primary) hypertension: Secondary | ICD-10-CM | POA: Diagnosis not present

## 2012-09-12 DIAGNOSIS — R5381 Other malaise: Secondary | ICD-10-CM | POA: Diagnosis not present

## 2012-09-12 DIAGNOSIS — Z87448 Personal history of other diseases of urinary system: Secondary | ICD-10-CM | POA: Diagnosis not present

## 2012-09-12 LAB — URINALYSIS, ROUTINE W REFLEX MICROSCOPIC
Bilirubin Urine: NEGATIVE
Leukocytes, UA: NEGATIVE
Nitrite: NEGATIVE
Specific Gravity, Urine: 1.041 — ABNORMAL HIGH (ref 1.005–1.030)
pH: 6 (ref 5.0–8.0)

## 2012-09-12 LAB — CBC WITH DIFFERENTIAL/PLATELET
Basophils Relative: 0 % (ref 0–1)
Eosinophils Absolute: 0.2 10*3/uL (ref 0.0–0.7)
Hemoglobin: 15.8 g/dL — ABNORMAL HIGH (ref 12.0–15.0)
MCH: 32.4 pg (ref 26.0–34.0)
MCHC: 36.7 g/dL — ABNORMAL HIGH (ref 30.0–36.0)
Monocytes Absolute: 0.9 10*3/uL (ref 0.1–1.0)
Neutrophils Relative %: 59 % (ref 43–77)
Platelets: 347 10*3/uL (ref 150–400)
RDW: 12.8 % (ref 11.5–15.5)

## 2012-09-12 LAB — COMPREHENSIVE METABOLIC PANEL
ALT: 12 U/L (ref 0–35)
Calcium: 10 mg/dL (ref 8.4–10.5)
GFR calc Af Amer: >90 mL/min (ref >90)
Glucose, Bld: 89 mg/dL (ref 70–99)
Sodium: 137 mEq/L (ref 135–145)
Total Protein: 7.6 g/dL (ref 6.0–8.3)

## 2012-09-12 LAB — POCT PREGNANCY, URINE: Preg Test, Ur: NEGATIVE

## 2012-09-12 MED ORDER — DIAZEPAM 5 MG PO TABS
5.0000 mg | ORAL_TABLET | Freq: Once | ORAL | Status: AC
  Administered 2012-09-12: 5 mg via ORAL
  Filled 2012-09-12: qty 1

## 2012-09-12 MED ORDER — METHOCARBAMOL 500 MG PO TABS: 500.0000 mg | ORAL_TABLET | Freq: Three times a day (TID) | ORAL | Status: DC | PRN

## 2012-09-12 MED ORDER — ONDANSETRON 4 MG PO TBDP
4.0000 mg | ORAL_TABLET | Freq: Once | ORAL | Status: AC
  Administered 2012-09-12: 4 mg via ORAL
  Filled 2012-09-12: qty 1

## 2012-09-12 MED ORDER — DIAZEPAM 5 MG PO TABS: 5.0000 mg | ORAL_TABLET | Freq: Three times a day (TID) | ORAL | Status: DC | PRN

## 2012-09-12 NOTE — ED Notes (Signed)
Pt reports HA that started today.  States that she has a 'weird' feeling over the past few days.  Pt has multiple complaints-reports (L) leg pain, reports N/V, chills and tingling in bilateral arms and legs, reports SOB.  Pt speaking in complete sentences, no acute distress noted.
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
## 2012-09-20 DIAGNOSIS — H1045 Other chronic allergic conjunctivitis: Secondary | ICD-10-CM | POA: Diagnosis not present

## 2012-10-15 DIAGNOSIS — Z3202 Encounter for pregnancy test, result negative: Secondary | ICD-10-CM | POA: Diagnosis not present

## 2012-10-15 DIAGNOSIS — Z124 Encounter for screening for malignant neoplasm of cervix: Secondary | ICD-10-CM | POA: Diagnosis not present

## 2012-10-15 DIAGNOSIS — N6459 Other signs and symptoms in breast: Secondary | ICD-10-CM | POA: Diagnosis not present

## 2012-10-16 DIAGNOSIS — Z124 Encounter for screening for malignant neoplasm of cervix: Secondary | ICD-10-CM | POA: Diagnosis not present

## 2012-11-22 ENCOUNTER — Other Ambulatory Visit: Payer: Self-pay

## 2013-03-10 DIAGNOSIS — S025XXA Fracture of tooth (traumatic), initial encounter for closed fracture: Secondary | ICD-10-CM | POA: Diagnosis not present

## 2013-03-10 DIAGNOSIS — N739 Female pelvic inflammatory disease, unspecified: Secondary | ICD-10-CM | POA: Diagnosis not present

## 2013-03-10 DIAGNOSIS — R6884 Jaw pain: Secondary | ICD-10-CM | POA: Diagnosis not present

## 2013-03-10 DIAGNOSIS — X58XXXA Exposure to other specified factors, initial encounter: Secondary | ICD-10-CM | POA: Diagnosis not present

## 2013-03-10 DIAGNOSIS — R300 Dysuria: Secondary | ICD-10-CM | POA: Diagnosis not present

## 2013-03-21 DIAGNOSIS — G43909 Migraine, unspecified, not intractable, without status migrainosus: Secondary | ICD-10-CM | POA: Diagnosis not present

## 2013-03-21 DIAGNOSIS — H109 Unspecified conjunctivitis: Secondary | ICD-10-CM | POA: Diagnosis not present

## 2013-04-25 DIAGNOSIS — F17200 Nicotine dependence, unspecified, uncomplicated: Secondary | ICD-10-CM | POA: Diagnosis not present

## 2013-04-25 DIAGNOSIS — K0889 Other specified disorders of teeth and supporting structures: Secondary | ICD-10-CM | POA: Diagnosis not present

## 2013-04-25 DIAGNOSIS — K089 Disorder of teeth and supporting structures, unspecified: Secondary | ICD-10-CM | POA: Diagnosis not present

## 2013-04-25 DIAGNOSIS — K047 Periapical abscess without sinus: Secondary | ICD-10-CM | POA: Diagnosis not present

## 2013-05-10 DIAGNOSIS — E875 Hyperkalemia: Secondary | ICD-10-CM | POA: Diagnosis not present

## 2013-05-10 DIAGNOSIS — E876 Hypokalemia: Secondary | ICD-10-CM | POA: Diagnosis not present

## 2013-05-10 DIAGNOSIS — E860 Dehydration: Secondary | ICD-10-CM | POA: Diagnosis not present

## 2013-05-10 DIAGNOSIS — F17200 Nicotine dependence, unspecified, uncomplicated: Secondary | ICD-10-CM | POA: Diagnosis not present

## 2013-05-10 DIAGNOSIS — F319 Bipolar disorder, unspecified: Secondary | ICD-10-CM | POA: Diagnosis not present

## 2013-05-10 DIAGNOSIS — D72829 Elevated white blood cell count, unspecified: Secondary | ICD-10-CM | POA: Diagnosis not present

## 2013-05-10 DIAGNOSIS — J45909 Unspecified asthma, uncomplicated: Secondary | ICD-10-CM | POA: Diagnosis not present

## 2013-05-10 DIAGNOSIS — R112 Nausea with vomiting, unspecified: Secondary | ICD-10-CM | POA: Diagnosis not present

## 2013-05-10 DIAGNOSIS — Z79899 Other long term (current) drug therapy: Secondary | ICD-10-CM | POA: Diagnosis not present

## 2013-05-10 DIAGNOSIS — F429 Obsessive-compulsive disorder, unspecified: Secondary | ICD-10-CM | POA: Diagnosis not present

## 2013-08-06 DIAGNOSIS — F4001 Agoraphobia with panic disorder: Secondary | ICD-10-CM | POA: Diagnosis not present

## 2013-08-06 DIAGNOSIS — F39 Unspecified mood [affective] disorder: Secondary | ICD-10-CM | POA: Diagnosis not present

## 2013-08-17 DIAGNOSIS — Z3480 Encounter for supervision of other normal pregnancy, unspecified trimester: Secondary | ICD-10-CM | POA: Diagnosis not present

## 2013-08-17 DIAGNOSIS — Z3201 Encounter for pregnancy test, result positive: Secondary | ICD-10-CM | POA: Diagnosis not present

## 2013-08-17 DIAGNOSIS — R109 Unspecified abdominal pain: Secondary | ICD-10-CM | POA: Diagnosis not present

## 2013-08-17 DIAGNOSIS — Z6832 Body mass index (BMI) 32.0-32.9, adult: Secondary | ICD-10-CM | POA: Diagnosis not present

## 2013-09-02 DIAGNOSIS — F39 Unspecified mood [affective] disorder: Secondary | ICD-10-CM | POA: Diagnosis not present

## 2013-09-12 ENCOUNTER — Other Ambulatory Visit: Payer: Self-pay | Admitting: Obstetrics and Gynecology

## 2013-09-12 DIAGNOSIS — Z124 Encounter for screening for malignant neoplasm of cervix: Secondary | ICD-10-CM | POA: Diagnosis not present

## 2013-09-12 DIAGNOSIS — O26849 Uterine size-date discrepancy, unspecified trimester: Secondary | ICD-10-CM | POA: Diagnosis not present

## 2013-09-13 LAB — CYTOLOGY - PAP

## 2013-09-24 DIAGNOSIS — F39 Unspecified mood [affective] disorder: Secondary | ICD-10-CM | POA: Diagnosis not present

## 2013-10-04 DIAGNOSIS — Z369 Encounter for antenatal screening, unspecified: Secondary | ICD-10-CM | POA: Diagnosis not present

## 2013-10-04 LAB — US OB TRANSVAGINAL

## 2013-10-07 DIAGNOSIS — Z36 Encounter for antenatal screening of mother: Secondary | ICD-10-CM | POA: Diagnosis not present

## 2013-10-07 DIAGNOSIS — Z369 Encounter for antenatal screening, unspecified: Secondary | ICD-10-CM | POA: Diagnosis not present

## 2013-10-07 DIAGNOSIS — L299 Pruritus, unspecified: Secondary | ICD-10-CM | POA: Diagnosis not present

## 2013-10-07 LAB — OB RESULTS CONSOLE ANTIBODY SCREEN: ANTIBODY SCREEN: NEGATIVE

## 2013-10-07 LAB — OB RESULTS CONSOLE ABO/RH: RH TYPE: POSITIVE

## 2013-10-07 LAB — OB RESULTS CONSOLE GC/CHLAMYDIA
Chlamydia: NEGATIVE
GC PROBE AMP, GENITAL: NEGATIVE

## 2013-10-07 LAB — OB RESULTS CONSOLE HEPATITIS B SURFACE ANTIGEN: HEP B S AG: NEGATIVE

## 2013-10-07 LAB — OB RESULTS CONSOLE RPR: RPR: NONREACTIVE

## 2013-10-07 LAB — OB RESULTS CONSOLE RUBELLA ANTIBODY, IGM: RUBELLA: IMMUNE

## 2013-10-07 LAB — OB RESULTS CONSOLE HIV ANTIBODY (ROUTINE TESTING): HIV: NONREACTIVE

## 2013-10-14 DIAGNOSIS — F39 Unspecified mood [affective] disorder: Secondary | ICD-10-CM | POA: Diagnosis not present

## 2013-11-01 ENCOUNTER — Other Ambulatory Visit: Payer: Self-pay

## 2013-11-14 DIAGNOSIS — Z36 Encounter for antenatal screening of mother: Secondary | ICD-10-CM | POA: Diagnosis not present

## 2013-11-22 DIAGNOSIS — Z36 Encounter for antenatal screening of mother: Secondary | ICD-10-CM | POA: Diagnosis not present
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
## 2014-01-22 ENCOUNTER — Ambulatory Visit (INDEPENDENT_AMBULATORY_CARE_PROVIDER_SITE_OTHER): Payer: Medicare Other | Admitting: Cardiology

## 2014-01-22 ENCOUNTER — Encounter: Payer: Self-pay | Admitting: Cardiology

## 2014-01-22 VITALS — BP 124/82 | HR 87 | Ht 68.0 in | Wt 222.0 lb

## 2014-01-22 DIAGNOSIS — Z720 Tobacco use: Secondary | ICD-10-CM | POA: Diagnosis not present

## 2014-01-22 DIAGNOSIS — Z331 Pregnant state, incidental: Secondary | ICD-10-CM

## 2014-01-22 DIAGNOSIS — Z8249 Family history of ischemic heart disease and other diseases of the circulatory system: Secondary | ICD-10-CM | POA: Insufficient documentation

## 2014-01-22 DIAGNOSIS — R002 Palpitations: Secondary | ICD-10-CM | POA: Diagnosis not present

## 2014-01-22 DIAGNOSIS — Z3490 Encounter for supervision of normal pregnancy, unspecified, unspecified trimester: Secondary | ICD-10-CM | POA: Insufficient documentation

## 2014-01-22 NOTE — Patient Instructions (Signed)
Your physician recommends that you continue on your current medications as directed. Please refer to the Current Medication list given to you today.  Your physician has recommended that you wear an event monitor. Event monitors are medical devices that record the heart's electrical activity. Doctors most often [place] these monitors to diagnose arrhythmias. Arrhythmias are problems with the speed or rhythm of the heartbeat. The monitor is a small, portable device. You can wear one while you do your normal daily activities. This is usually used to diagnose what is causing palpitations/syncope (passing out).  Your physician has requested that you have an echocardiogram. Echocardiography is a painless test that uses sound waves to create images of your heart. It provides your doctor with information about the size and shape of your heart and how well your heart's chambers and valves are working. This procedure takes approximately one hour. There are no restrictions for this procedure.  Your physician recommends that you schedule a follow-up appointment as needed
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
## 2014-01-23 ENCOUNTER — Other Ambulatory Visit: Payer: Self-pay | Admitting: Obstetrics & Gynecology

## 2014-01-23 DIAGNOSIS — N760 Acute vaginitis: Secondary | ICD-10-CM | POA: Diagnosis not present

## 2014-01-23 DIAGNOSIS — Z36 Encounter for antenatal screening of mother: Secondary | ICD-10-CM | POA: Diagnosis not present

## 2014-01-30 ENCOUNTER — Encounter: Payer: Self-pay | Admitting: *Deleted

## 2014-01-30 ENCOUNTER — Ambulatory Visit (HOSPITAL_COMMUNITY): Payer: Medicare Other | Attending: Cardiovascular Disease

## 2014-01-30 ENCOUNTER — Encounter (INDEPENDENT_AMBULATORY_CARE_PROVIDER_SITE_OTHER): Payer: Medicare Other

## 2014-01-30 DIAGNOSIS — R002 Palpitations: Secondary | ICD-10-CM | POA: Diagnosis not present

## 2014-01-30 DIAGNOSIS — Z87891 Personal history of nicotine dependence: Secondary | ICD-10-CM | POA: Insufficient documentation

## 2014-01-30 DIAGNOSIS — Z8249 Family history of ischemic heart disease and other diseases of the circulatory system: Secondary | ICD-10-CM | POA: Diagnosis not present

## 2014-01-30 NOTE — Progress Notes (Signed)
Patient ID: [name], female   DOB: Jun 21, 1985, 29 y.o.   MRN: [id number] [employer] 14 day cardiac event monitor applied to patient.

## 2014-01-30 NOTE — Progress Notes (Signed)
2D Echo completed. 01/30/2014

## 2014-02-05 ENCOUNTER — Telehealth: Payer: Self-pay | Admitting: Cardiology

## 2014-02-05 NOTE — Telephone Encounter (Signed)
Per [employer] - the new monitor was shipped 1/15 and delivered 1/18.  Will wait for the pt to call back and will discuss with her monitor placement.

## 2014-02-05 NOTE — Telephone Encounter (Signed)
Unable to reach pt by phone.  Spoke with mother who reports pt is fine - she saw her last night.  Mother reports the monitor the pt had was broken and she was told to mail it back in and they would send her another one.

## 2014-02-05 NOTE — Telephone Encounter (Signed)
Per [employer] pt has not transmitted in the last 3 days, numerous daily attempts at reaching patient were unsuccessful

## 2014-02-06 NOTE — Telephone Encounter (Signed)
Left message on identified voicemail for pt to call back to discuss monitor.

## 2014-02-06 NOTE — Telephone Encounter (Signed)
Pt called back and reported she did receive the monitor and is wearing it now.  She has no questions or concerns at this time.

## 2014-02-13 ENCOUNTER — Encounter (HOSPITAL_COMMUNITY): Payer: Self-pay | Admitting: *Deleted

## 2014-02-13 ENCOUNTER — Inpatient Hospital Stay (HOSPITAL_COMMUNITY)
Admission: AD | Admit: 2014-02-13 | Discharge: 2014-02-13 | Disposition: A | Discharge status: Home / Self Care | Payer: Medicare Other | Source: Ambulatory Visit | Attending: Obstetrics and Gynecology | Admitting: Obstetrics and Gynecology

## 2014-02-13 DIAGNOSIS — Z3A31 31 weeks gestation of pregnancy: Secondary | ICD-10-CM | POA: Insufficient documentation

## 2014-02-13 DIAGNOSIS — O133 Gestational [pregnancy-induced] hypertension without significant proteinuria, third trimester: Secondary | ICD-10-CM

## 2014-02-13 DIAGNOSIS — R030 Elevated blood-pressure reading, without diagnosis of hypertension: Secondary | ICD-10-CM | POA: Diagnosis not present

## 2014-02-13 DIAGNOSIS — O163 Unspecified maternal hypertension, third trimester: Secondary | ICD-10-CM

## 2014-02-13 DIAGNOSIS — R51 Headache: Secondary | ICD-10-CM | POA: Insufficient documentation

## 2014-02-13 DIAGNOSIS — H539 Unspecified visual disturbance: Secondary | ICD-10-CM | POA: Diagnosis not present

## 2014-02-13 DIAGNOSIS — O9989 Other specified diseases and conditions complicating pregnancy, childbirth and the puerperium: Secondary | ICD-10-CM | POA: Insufficient documentation

## 2014-02-13 DIAGNOSIS — O26893 Other specified pregnancy related conditions, third trimester: Secondary | ICD-10-CM

## 2014-02-13 HISTORY — DX: Palpitations: R00.2

## 2014-02-13 HISTORY — DX: Headache, unspecified: R51.9

## 2014-02-13 HISTORY — DX: Headache: R51

## 2014-02-13 HISTORY — DX: Bipolar disorder, currently in remission, most recent episode unspecified: F31.70

## 2014-02-13 HISTORY — DX: Unspecified asthma, uncomplicated: J45.909

## 2014-02-13 LAB — LACTATE DEHYDROGENASE: LDH: 144 U/L (ref 94–250)

## 2014-02-13 LAB — CBC
HCT: 38.6 % (ref 36.0–46.0)
HEMOGLOBIN: 13.5 g/dL (ref 12.0–15.0)
MCH: 32.1 pg (ref 26.0–34.0)
MCHC: 35 g/dL (ref 30.0–36.0)
MCV: 91.7 fL (ref 78.0–100.0)
PLATELETS: 285 10*3/uL (ref 150–400)
RBC: 4.21 MIL/uL (ref 3.87–5.11)
RDW: 13.4 % (ref 11.5–15.5)
WBC: 14.6 10*3/uL — ABNORMAL HIGH (ref 4.0–10.5)

## 2014-02-13 LAB — COMPREHENSIVE METABOLIC PANEL
ALBUMIN: 3.3 g/dL — AB (ref 3.5–5.2)
ALT: 16 U/L (ref 0–35)
ANION GAP: 7 (ref 5–15)
AST: 17 U/L (ref 0–37)
Alkaline Phosphatase: 95 U/L (ref 39–117)
BUN: 7 mg/dL (ref 6–23)
CALCIUM: 9.2 mg/dL (ref 8.4–10.5)
CO2: 25 mmol/L (ref 19–32)
Chloride: 106 mmol/L (ref 96–112)
Creatinine, Ser: 0.48 mg/dL — ABNORMAL LOW (ref 0.50–1.10)
GFR calc Af Amer: >90 mL/min (ref >90)
Glucose, Bld: 81 mg/dL (ref 70–99)
POTASSIUM: 3.8 mmol/L (ref 3.5–5.1)
Sodium: 138 mmol/L (ref 135–145)
TOTAL PROTEIN: 7.1 g/dL (ref 6.0–8.3)
Total Bilirubin: 0.2 mg/dL — ABNORMAL LOW (ref 0.3–1.2)

## 2014-02-13 LAB — URINALYSIS, ROUTINE W REFLEX MICROSCOPIC
BILIRUBIN URINE: NEGATIVE
Glucose, UA: NEGATIVE mg/dL
HGB URINE DIPSTICK: NEGATIVE
Ketones, ur: NEGATIVE mg/dL
Leukocytes, UA: NEGATIVE
Nitrite: NEGATIVE
PH: 6 (ref 5.0–8.0)
PROTEIN: NEGATIVE mg/dL
SPECIFIC GRAVITY, URINE: 1.025 (ref 1.005–1.030)
Urobilinogen, UA: 0.2 mg/dL (ref 0.0–1.0)

## 2014-02-13 LAB — URIC ACID: URIC ACID, SERUM: 3.3 mg/dL (ref 2.4–7.0)

## 2014-02-13 LAB — PROTEIN / CREATININE RATIO, URINE
Creatinine, Urine: 115 mg/dL
Protein Creatinine Ratio: 0.09 (ref 0.00–0.15)
TOTAL PROTEIN, URINE: 10 mg/dL

## 2014-02-13 MED ORDER — BUTALBITAL-APAP-CAFFEINE 50-325-40 MG PO TABS
2.0000 | ORAL_TABLET | Freq: Once | ORAL | Status: AC
  Administered 2014-02-13: 2 via ORAL
  Filled 2014-02-13: qty 2
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
## 2014-02-13 NOTE — MAU Note (Signed)
Pt stated she has been having headache and visual disturbance for the past week or 2. Had Doctors appointment today and sent over to be evaluated. B/p slightly elevated.

## 2014-02-13 NOTE — Discharge Instructions (Signed)

## 2014-02-21 DIAGNOSIS — Z23 Encounter for immunization: Secondary | ICD-10-CM | POA: Diagnosis not present

## 2014-02-27 ENCOUNTER — Other Ambulatory Visit: Payer: Self-pay | Admitting: Obstetrics & Gynecology

## 2014-02-27 DIAGNOSIS — O133 Gestational [pregnancy-induced] hypertension without significant proteinuria, third trimester: Secondary | ICD-10-CM | POA: Diagnosis not present

## 2014-02-27 DIAGNOSIS — N760 Acute vaginitis: Secondary | ICD-10-CM | POA: Diagnosis not present

## 2014-03-06 DIAGNOSIS — O133 Gestational [pregnancy-induced] hypertension without significant proteinuria, third trimester: Secondary | ICD-10-CM | POA: Diagnosis not present

## 2014-03-11 ENCOUNTER — Encounter (HOSPITAL_COMMUNITY): Payer: Self-pay | Admitting: *Deleted

## 2014-03-11 ENCOUNTER — Inpatient Hospital Stay (HOSPITAL_COMMUNITY)
Admission: AD | Admit: 2014-03-11 | Discharge: 2014-03-11 | Disposition: A | Discharge status: Home / Self Care | Payer: Medicare Other | Source: Ambulatory Visit | Attending: Obstetrics and Gynecology | Admitting: Obstetrics and Gynecology

## 2014-03-11 DIAGNOSIS — O9989 Other specified diseases and conditions complicating pregnancy, childbirth and the puerperium: Secondary | ICD-10-CM | POA: Diagnosis not present

## 2014-03-11 DIAGNOSIS — R030 Elevated blood-pressure reading, without diagnosis of hypertension: Secondary | ICD-10-CM | POA: Insufficient documentation

## 2014-03-11 DIAGNOSIS — Z3A34 34 weeks gestation of pregnancy: Secondary | ICD-10-CM | POA: Insufficient documentation

## 2014-03-11 DIAGNOSIS — Z87891 Personal history of nicotine dependence: Secondary | ICD-10-CM | POA: Insufficient documentation

## 2014-03-11 DIAGNOSIS — O139 Gestational [pregnancy-induced] hypertension without significant proteinuria, unspecified trimester: Secondary | ICD-10-CM | POA: Diagnosis not present

## 2014-03-11 LAB — URINALYSIS, ROUTINE W REFLEX MICROSCOPIC
Bilirubin Urine: NEGATIVE
Glucose, UA: NEGATIVE mg/dL
Hgb urine dipstick: NEGATIVE
Ketones, ur: NEGATIVE mg/dL
Leukocytes, UA: NEGATIVE
Nitrite: NEGATIVE
PROTEIN: NEGATIVE mg/dL
SPECIFIC GRAVITY, URINE: 1.02 (ref 1.005–1.030)
UROBILINOGEN UA: 0.2 mg/dL (ref 0.0–1.0)
pH: 6 (ref 5.0–8.0)

## 2014-03-11 LAB — COMPREHENSIVE METABOLIC PANEL
ALBUMIN: 2.9 g/dL — AB (ref 3.5–5.2)
ALT: 15 U/L (ref 0–35)
ANION GAP: 5 (ref 5–15)
AST: 18 U/L (ref 0–37)
Alkaline Phosphatase: 103 U/L (ref 39–117)
BILIRUBIN TOTAL: 0.3 mg/dL (ref 0.3–1.2)
BUN: 7 mg/dL (ref 6–23)
CO2: 22 mmol/L (ref 19–32)
CREATININE: 0.54 mg/dL (ref 0.50–1.10)
Calcium: 9.5 mg/dL (ref 8.4–10.5)
Chloride: 109 mmol/L (ref 96–112)
GFR calc non Af Amer: >90 mL/min (ref >90)
GLUCOSE: 93 mg/dL (ref 70–99)
Potassium: 3.9 mmol/L (ref 3.5–5.1)
Sodium: 136 mmol/L (ref 135–145)
Total Protein: 6.4 g/dL (ref 6.0–8.3)

## 2014-03-11 LAB — CBC
HCT: 37.9 % (ref 36.0–46.0)
HEMOGLOBIN: 13.2 g/dL (ref 12.0–15.0)
MCH: 31.7 pg (ref 26.0–34.0)
MCHC: 34.8 g/dL (ref 30.0–36.0)
MCV: 91.1 fL (ref 78.0–100.0)
Platelets: 259 10*3/uL (ref 150–400)
RBC: 4.16 MIL/uL (ref 3.87–5.11)
RDW: 13.6 % (ref 11.5–15.5)
WBC: 15.2 10*3/uL — ABNORMAL HIGH (ref 4.0–10.5)

## 2014-03-11 LAB — PROTEIN / CREATININE RATIO, URINE
CREATININE, URINE: 70 mg/dL
Total Protein, Urine: <6 mg/dL

## 2014-03-11 LAB — LACTATE DEHYDROGENASE: LDH: 141 U/L (ref 94–250)

## 2014-03-11 LAB — URIC ACID: Uric Acid, Serum: 4.2 mg/dL (ref 2.4–7.0)
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
## 2014-03-11 NOTE — MAU Note (Signed)
Urine in lab 

## 2014-03-11 NOTE — MAU Note (Signed)
Sent from office again.  BP up. Now having swelling in hands.  Been having headaches off an on, not as bad as last time. No visual changes reported today. Denies epigastric pain.

## 2014-03-11 NOTE — Discharge Instructions (Signed)
Keep your scheduled appointment for prenatal care. Call the office or provider on call with further concerns or symptoms of preeclampsia as discussed and as printed on handout and discharge instructions.

## 2014-03-14 ENCOUNTER — Encounter (HOSPITAL_COMMUNITY): Payer: Self-pay

## 2014-03-14 ENCOUNTER — Other Ambulatory Visit: Payer: Self-pay | Admitting: Obstetrics and Gynecology

## 2014-03-14 DIAGNOSIS — O133 Gestational [pregnancy-induced] hypertension without significant proteinuria, third trimester: Secondary | ICD-10-CM | POA: Diagnosis not present

## 2014-03-14 DIAGNOSIS — Z36 Encounter for antenatal screening of mother: Secondary | ICD-10-CM | POA: Diagnosis not present

## 2014-03-14 LAB — OB RESULTS CONSOLE GBS: STREP GROUP B AG: POSITIVE

## 2014-03-15 IMAGING — CR DG THORACIC SPINE 3V
4 series · 4 of 4 positions shown · non-contrast
Comparison: None.

CLINICAL DATA: Pain for months.  Worse recently.

THORACIC SPINE - 2 VIEW + SWIMMERS

[view not recorded (1 of 4)]
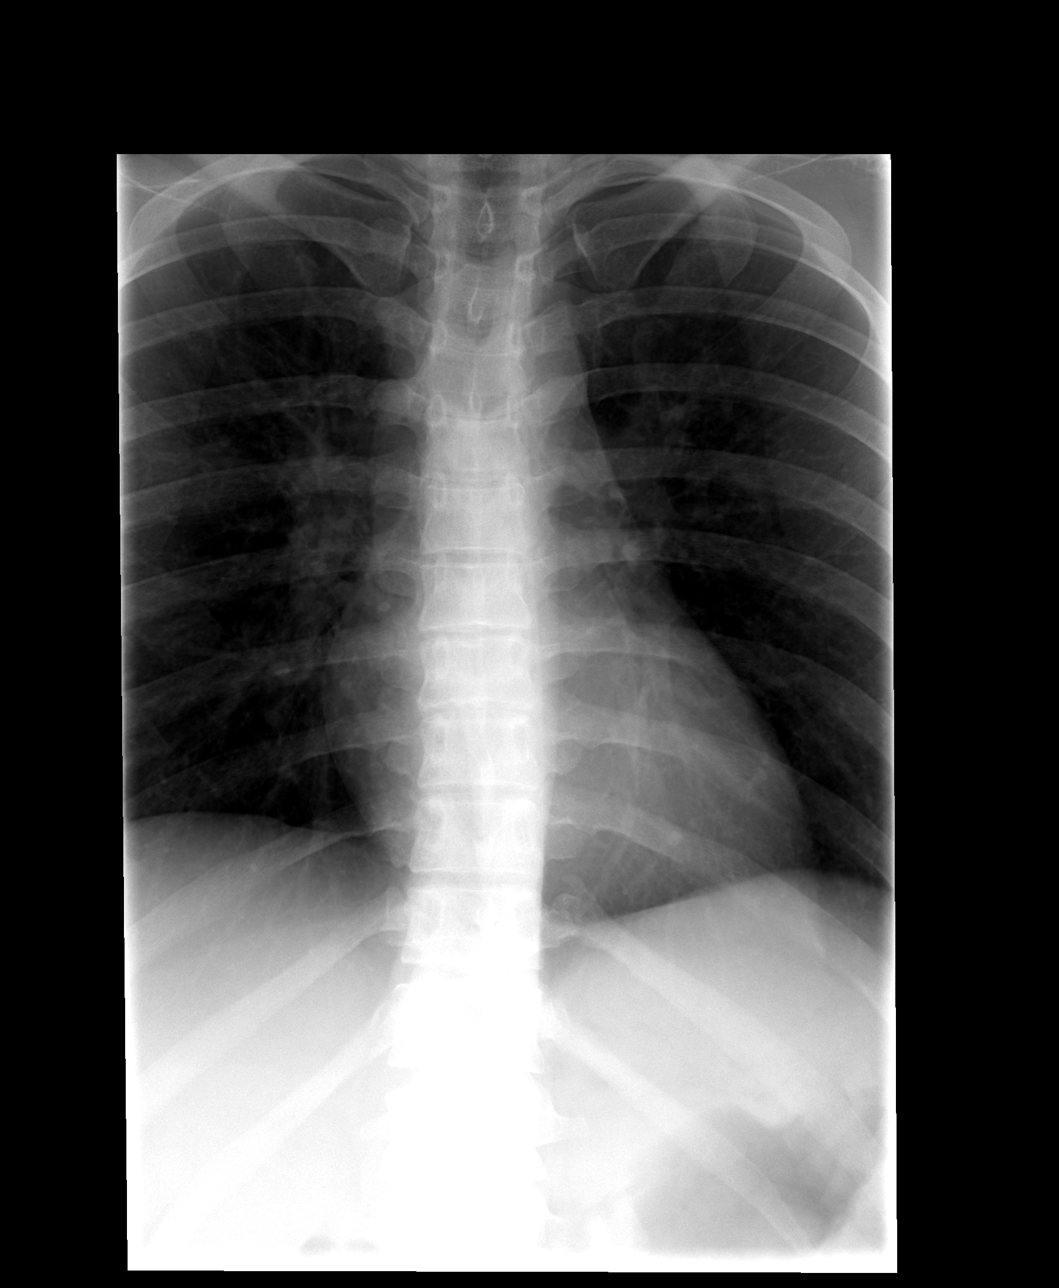

[view not recorded (2 of 4)]
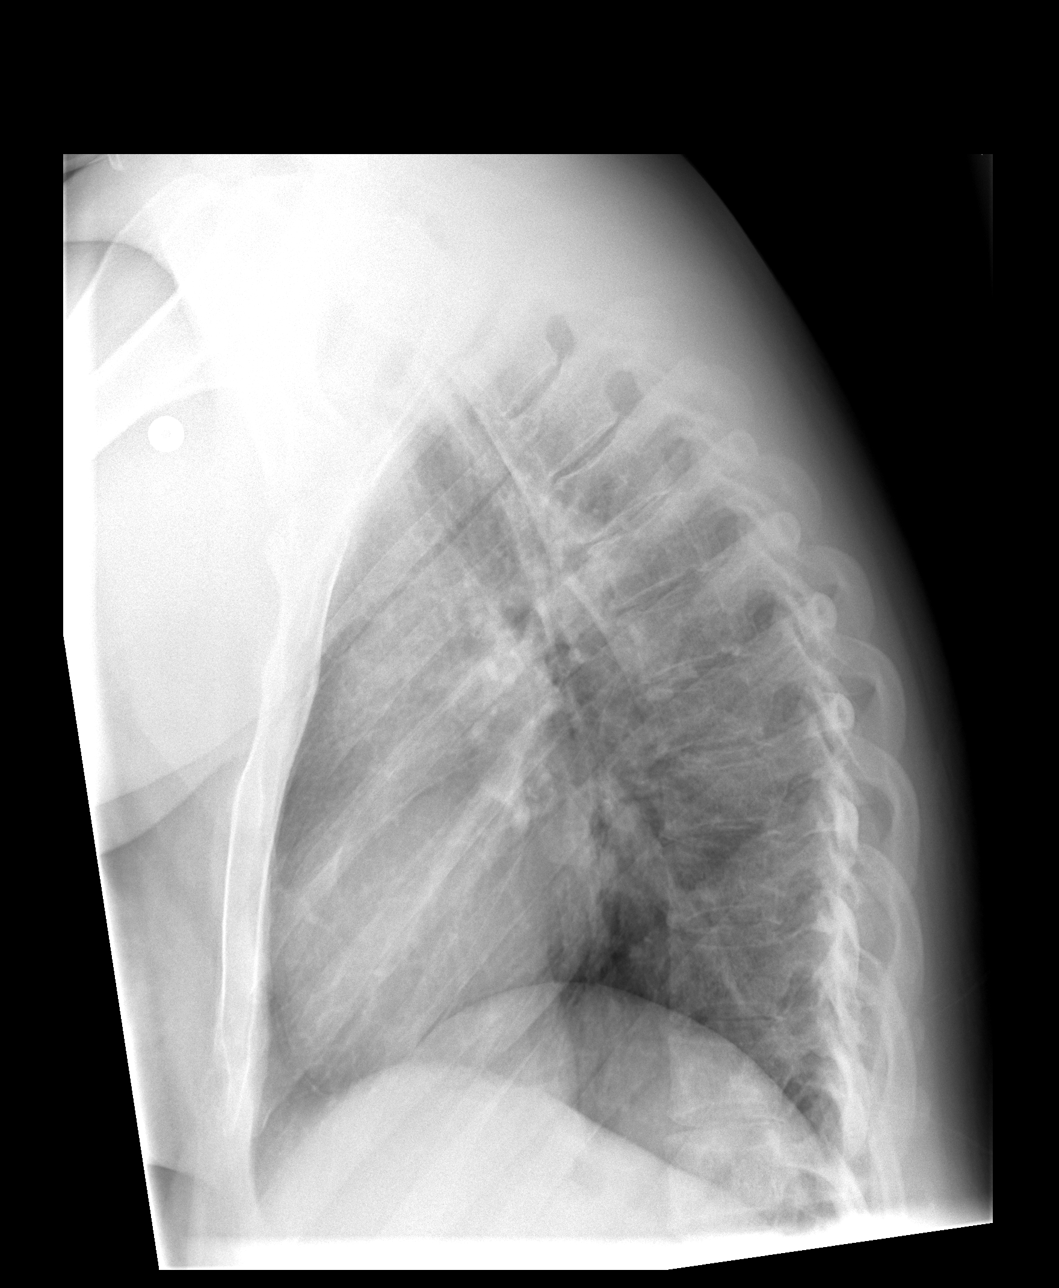

[view not recorded (3 of 4)]
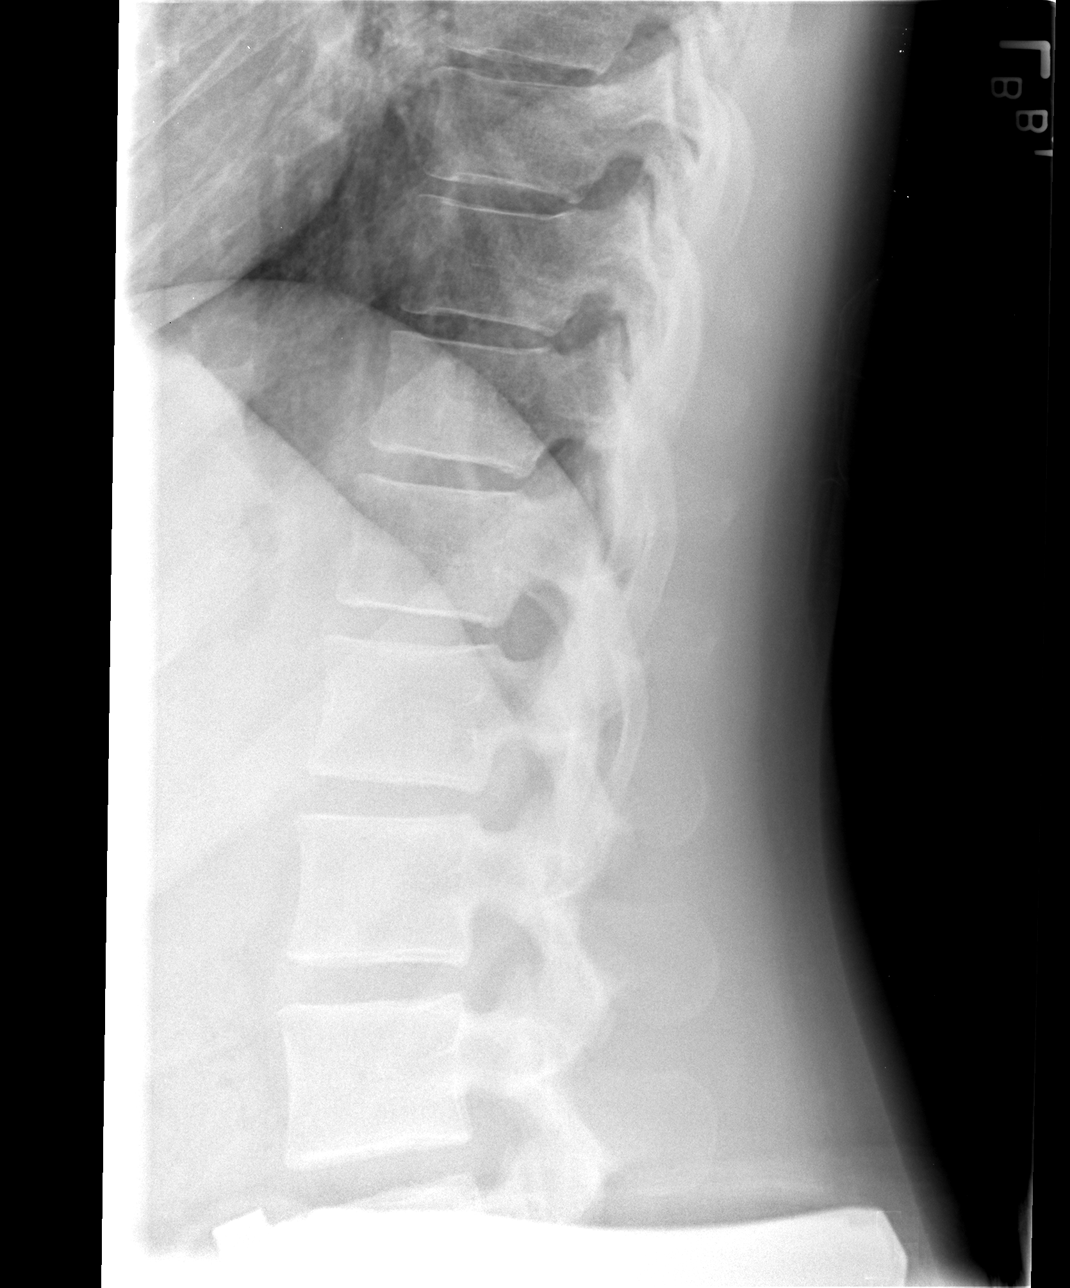

[view not recorded (4 of 4)]
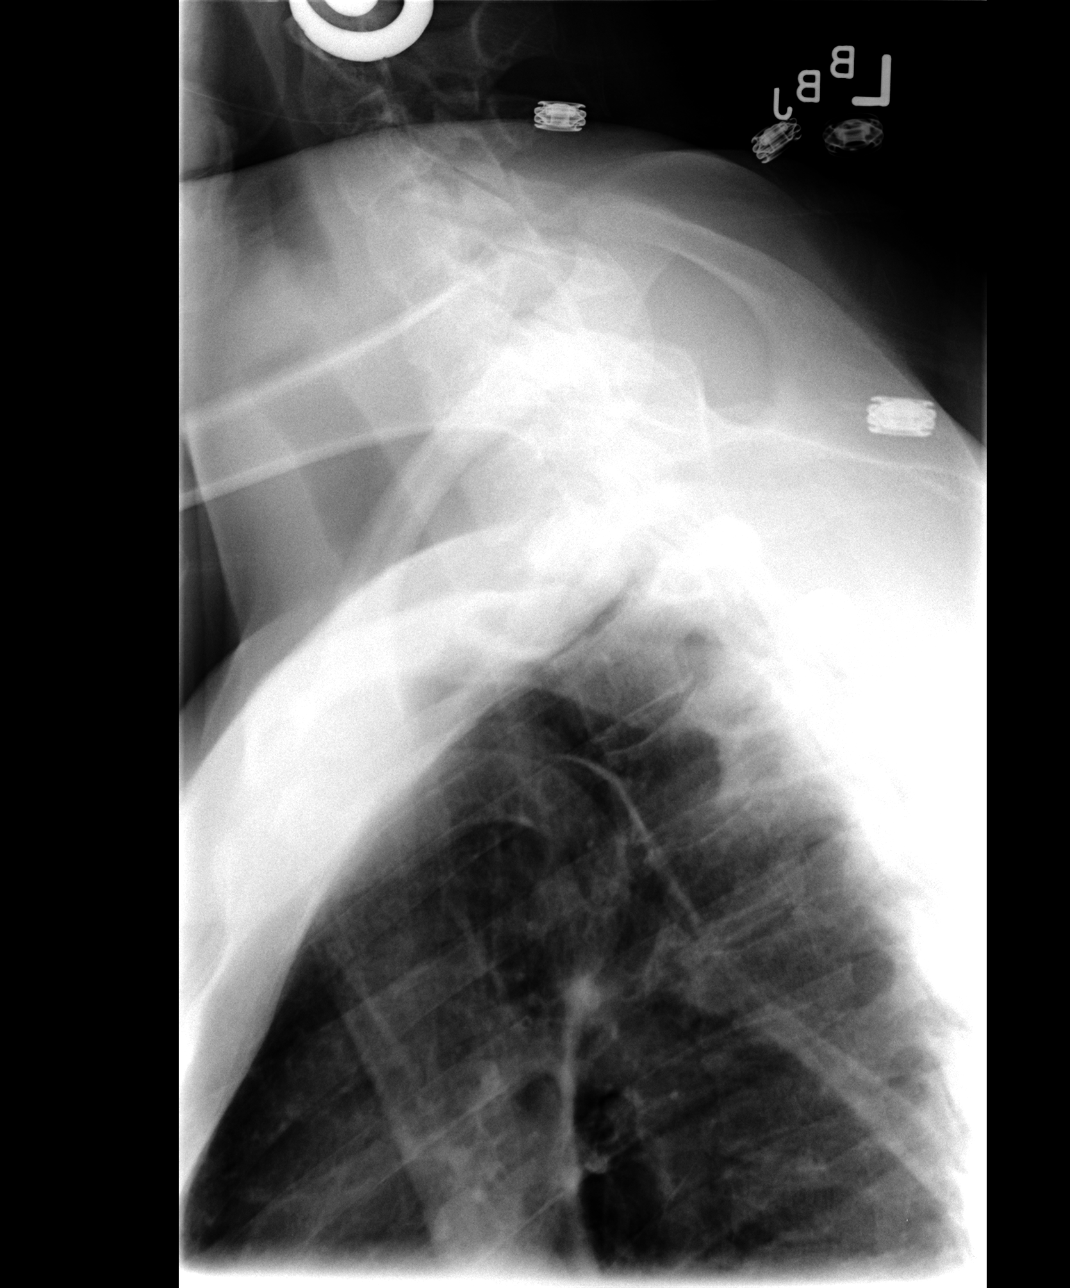

[4 of 4 positions shown; findings below may reference images not displayed]

FINDINGS: There is no evidence of thoracic spine fracture.
Alignment is normal.  No other significant bone abnormalities are
identified.
IMPRESSION: Negative.

## 2014-03-18 DIAGNOSIS — O133 Gestational [pregnancy-induced] hypertension without significant proteinuria, third trimester: Secondary | ICD-10-CM | POA: Diagnosis not present

## 2014-03-19 ENCOUNTER — Telehealth: Payer: Self-pay | Admitting: *Deleted

## 2014-03-19 NOTE — Telephone Encounter (Signed)
Left message for pt to call back for results of monitor which demonstrated no At Fib, no PSVT, NSR with average HR 99 bpm, some PVCs which is the reason for the fluttering she feels.  No changes or treatment recommended by Dr Marlou Porch.

## 2014-03-20 NOTE — Telephone Encounter (Signed)
Follow up     Want monitor results

## 2014-03-20 NOTE — Telephone Encounter (Signed)
Pt aware of results and had no further questions.  She thanked me for me time and concern.

## 2014-03-21 DIAGNOSIS — O133 Gestational [pregnancy-induced] hypertension without significant proteinuria, third trimester: Secondary | ICD-10-CM | POA: Diagnosis not present

## 2014-03-25 ENCOUNTER — Encounter (HOSPITAL_COMMUNITY): Payer: Self-pay | Admitting: *Deleted

## 2014-03-25 ENCOUNTER — Telehealth (HOSPITAL_COMMUNITY): Payer: Self-pay | Admitting: *Deleted

## 2014-03-25 NOTE — Telephone Encounter (Signed)
Preadmission screen  

## 2014-03-28 DIAGNOSIS — O139 Gestational [pregnancy-induced] hypertension without significant proteinuria, unspecified trimester: Secondary | ICD-10-CM | POA: Diagnosis not present

## 2014-03-30 ENCOUNTER — Inpatient Hospital Stay (HOSPITAL_COMMUNITY)
Admission: RE | Admit: 2014-03-30 | Discharge: 2014-04-02 | DRG: 775 | Disposition: A | Discharge status: Home / Self Care | Payer: Medicare Other | Source: Ambulatory Visit | Attending: Obstetrics & Gynecology | Admitting: Obstetrics & Gynecology

## 2014-03-30 ENCOUNTER — Encounter (HOSPITAL_COMMUNITY): Payer: Self-pay

## 2014-03-30 DIAGNOSIS — Z3A37 37 weeks gestation of pregnancy: Secondary | ICD-10-CM | POA: Diagnosis not present

## 2014-03-30 DIAGNOSIS — O09523 Supervision of elderly multigravida, third trimester: Secondary | ICD-10-CM | POA: Diagnosis present

## 2014-03-30 DIAGNOSIS — O99824 Streptococcus B carrier state complicating childbirth: Secondary | ICD-10-CM | POA: Diagnosis present

## 2014-03-30 DIAGNOSIS — O133 Gestational [pregnancy-induced] hypertension without significant proteinuria, third trimester: Secondary | ICD-10-CM | POA: Diagnosis present

## 2014-03-30 DIAGNOSIS — Z3490 Encounter for supervision of normal pregnancy, unspecified, unspecified trimester: Secondary | ICD-10-CM

## 2014-03-30 LAB — CBC
HEMATOCRIT: 36.7 % (ref 36.0–46.0)
Hemoglobin: 12.7 g/dL (ref 12.0–15.0)
MCH: 31.3 pg (ref 26.0–34.0)
MCHC: 34.6 g/dL (ref 30.0–36.0)
MCV: 90.4 fL (ref 78.0–100.0)
Platelets: 264 10*3/uL (ref 150–400)
RBC: 4.06 MIL/uL (ref 3.87–5.11)
RDW: 13.7 % (ref 11.5–15.5)
WBC: 14.2 10*3/uL — AB (ref 4.0–10.5)

## 2014-03-30 LAB — COMPREHENSIVE METABOLIC PANEL
ALT: 18 U/L (ref 0–35)
AST: 25 U/L (ref 0–37)
Albumin: 2.9 g/dL — ABNORMAL LOW (ref 3.5–5.2)
Alkaline Phosphatase: 129 U/L — ABNORMAL HIGH (ref 39–117)
Anion gap: 10 (ref 5–15)
BUN: 9 mg/dL (ref 6–23)
CALCIUM: 10 mg/dL (ref 8.4–10.5)
CO2: 20 mmol/L (ref 19–32)
Chloride: 107 mmol/L (ref 96–112)
Creatinine, Ser: 0.61 mg/dL (ref 0.50–1.10)
GFR calc non Af Amer: >90 mL/min (ref >90)
Glucose, Bld: 80 mg/dL (ref 70–99)
POTASSIUM: 3.9 mmol/L (ref 3.5–5.1)
Sodium: 137 mmol/L (ref 135–145)
TOTAL PROTEIN: 6.3 g/dL (ref 6.0–8.3)
Total Bilirubin: 0.3 mg/dL (ref 0.3–1.2)

## 2014-03-30 LAB — PROTEIN / CREATININE RATIO, URINE
CREATININE, URINE: 149 mg/dL
PROTEIN CREATININE RATIO: 0.11 (ref 0.00–0.15)
Total Protein, Urine: 17 mg/dL

## 2014-03-30 MED ORDER — CLINDAMYCIN PHOSPHATE 900 MG/50ML IV SOLN
900.0000 mg | Freq: Three times a day (TID) | INTRAVENOUS | Status: DC
  Administered 2014-03-30 – 2014-03-31 (×2): 900 mg via INTRAVENOUS
  Filled 2014-03-30 (×4): qty 50

## 2014-03-30 MED ORDER — ONDANSETRON HCL 4 MG/2ML IJ SOLN: 4.0000 mg | Freq: Four times a day (QID) | INTRAMUSCULAR | Status: DC | PRN

## 2014-03-30 MED ORDER — ACETAMINOPHEN 325 MG PO TABS: 650.0000 mg | ORAL_TABLET | ORAL | Status: DC | PRN

## 2014-03-30 MED ORDER — CITRIC ACID-SODIUM CITRATE 334-500 MG/5ML PO SOLN: 30.0000 mL | ORAL | Status: DC | PRN

## 2014-03-30 MED ORDER — LABETALOL HCL 5 MG/ML IV SOLN
20.0000 mg | INTRAVENOUS | Status: AC | PRN
  Administered 2014-03-31 (×2): 20 mg via INTRAVENOUS
  Administered 2014-03-31: 40 mg via INTRAVENOUS
  Filled 2014-03-30: qty 4
  Filled 2014-03-30: qty 8
  Filled 2014-03-30: qty 4

## 2014-03-30 MED ORDER — OXYTOCIN 40 UNITS IN LACTATED RINGERS INFUSION - SIMPLE MED
1.0000 m[IU]/min | INTRAVENOUS | Status: DC
  Administered 2014-03-30: 2 m[IU]/min via INTRAVENOUS

## 2014-03-30 MED ORDER — LIDOCAINE HCL (PF) 1 % IJ SOLN
30.0000 mL | INTRAMUSCULAR | Status: DC | PRN
  Filled 2014-03-30: qty 30

## 2014-03-30 MED ORDER — BUTORPHANOL TARTRATE 1 MG/ML IJ SOLN
1.0000 mg | INTRAMUSCULAR | Status: DC | PRN
  Administered 2014-03-31 (×5): 1 mg via INTRAVENOUS
  Filled 2014-03-30 (×5): qty 1

## 2014-03-30 MED ORDER — OXYCODONE-ACETAMINOPHEN 5-325 MG PO TABS: 1.0000 | ORAL_TABLET | ORAL | Status: DC | PRN

## 2014-03-30 MED ORDER — TERBUTALINE SULFATE 1 MG/ML IJ SOLN: 0.2500 mg | Freq: Once | INTRAMUSCULAR | Status: AC | PRN

## 2014-03-30 MED ORDER — FLEET ENEMA 7-19 GM/118ML RE ENEM: 1.0000 | ENEMA | RECTAL | Status: DC | PRN

## 2014-03-30 MED ORDER — OXYCODONE-ACETAMINOPHEN 5-325 MG PO TABS: 2.0000 | ORAL_TABLET | ORAL | Status: DC | PRN

## 2014-03-30 MED ORDER — LACTATED RINGERS IV SOLN: 500.0000 mL | INTRAVENOUS | Status: DC | PRN

## 2014-03-30 MED ORDER — OXYTOCIN BOLUS FROM INFUSION: 500.0000 mL | INTRAVENOUS | Status: DC

## 2014-03-30 MED ORDER — OXYTOCIN 40 UNITS IN LACTATED RINGERS INFUSION - SIMPLE MED
62.5000 mL/h | INTRAVENOUS | Status: DC
  Filled 2014-03-30: qty 1000

## 2014-03-30 MED ORDER — LACTATED RINGERS IV SOLN: INTRAVENOUS | Status: DC

## 2014-03-30 NOTE — Progress Notes (Signed)
As per AC's instruction, researching patient for possible admission to AICU.

## 2014-03-31 ENCOUNTER — Inpatient Hospital Stay (HOSPITAL_COMMUNITY): Payer: Medicare Other | Admitting: Anesthesiology

## 2014-03-31 ENCOUNTER — Encounter (HOSPITAL_COMMUNITY): Payer: Self-pay

## 2014-03-31 LAB — CBC
HEMATOCRIT: 36.1 % (ref 36.0–46.0)
HEMATOCRIT: 35.9 % — AB (ref 36.0–46.0)
HEMOGLOBIN: 12.5 g/dL (ref 12.0–15.0)
HEMOGLOBIN: 12.6 g/dL (ref 12.0–15.0)
MCH: 31.6 pg (ref 26.0–34.0)
MCH: 31.9 pg (ref 26.0–34.0)
MCHC: 34.6 g/dL (ref 30.0–36.0)
MCHC: 35.1 g/dL (ref 30.0–36.0)
MCV: 91.2 fL (ref 78.0–100.0)
MCV: 90.9 fL (ref 78.0–100.0)
PLATELETS: 221 10*3/uL (ref 150–400)
Platelets: 216 10*3/uL (ref 150–400)
RBC: 3.96 MIL/uL (ref 3.87–5.11)
RBC: 3.95 MIL/uL (ref 3.87–5.11)
RDW: 13.8 % (ref 11.5–15.5)
RDW: 13.6 % (ref 11.5–15.5)
WBC: 12.9 10*3/uL — ABNORMAL HIGH (ref 4.0–10.5)
WBC: 17.4 10*3/uL — AB (ref 4.0–10.5)

## 2014-03-31 LAB — RPR: RPR Ser Ql: NONREACTIVE

## 2014-03-31 LAB — TYPE AND SCREEN
ABO/RH(D): A POS
Antibody Screen: NEGATIVE

## 2014-03-31 LAB — ABO/RH: ABO/RH(D): A POS

## 2014-03-31 MED ORDER — DIBUCAINE 1 % RE OINT
1.0000 "application " | TOPICAL_OINTMENT | RECTAL | Status: DC | PRN
  Administered 2014-03-31: 1 via RECTAL
  Filled 2014-03-31: qty 28

## 2014-03-31 MED ORDER — PRENATAL MULTIVITAMIN CH
1.0000 | ORAL_TABLET | Freq: Every day | ORAL | Status: DC
  Administered 2014-04-01 – 2014-04-02 (×2): 1 via ORAL
  Filled 2014-03-31 (×2): qty 1

## 2014-03-31 MED ORDER — OXYCODONE-ACETAMINOPHEN 5-325 MG PO TABS
1.0000 | ORAL_TABLET | ORAL | Status: DC | PRN
  Administered 2014-03-31: 1 via ORAL
  Filled 2014-03-31: qty 1

## 2014-03-31 MED ORDER — DIPHENHYDRAMINE HCL 25 MG PO CAPS: 25.0000 mg | ORAL_CAPSULE | Freq: Four times a day (QID) | ORAL | Status: DC | PRN

## 2014-03-31 MED ORDER — EPHEDRINE 5 MG/ML INJ
10.0000 mg | INTRAVENOUS | Status: DC | PRN
  Filled 2014-03-31: qty 2
  Filled 2014-03-31: qty 4

## 2014-03-31 MED ORDER — ZOLPIDEM TARTRATE 5 MG PO TABS: 5.0000 mg | ORAL_TABLET | Freq: Every evening | ORAL | Status: DC | PRN

## 2014-03-31 MED ORDER — OXYCODONE-ACETAMINOPHEN 5-325 MG PO TABS
2.0000 | ORAL_TABLET | ORAL | Status: DC | PRN
  Administered 2014-04-01 (×4): 2 via ORAL
  Filled 2014-03-31 (×4): qty 2

## 2014-03-31 MED ORDER — WITCH HAZEL-GLYCERIN EX PADS
1.0000 "application " | MEDICATED_PAD | CUTANEOUS | Status: DC | PRN
  Administered 2014-03-31: 1 via TOPICAL

## 2014-03-31 MED ORDER — EPHEDRINE 5 MG/ML INJ
10.0000 mg | INTRAVENOUS | Status: DC | PRN
  Filled 2014-03-31: qty 2

## 2014-03-31 MED ORDER — PHENYLEPHRINE 40 MCG/ML (10ML) SYRINGE FOR IV PUSH (FOR BLOOD PRESSURE SUPPORT)
80.0000 ug | PREFILLED_SYRINGE | INTRAVENOUS | Status: DC | PRN
  Filled 2014-03-31: qty 2

## 2014-03-31 MED ORDER — ZOLPIDEM TARTRATE 5 MG PO TABS
5.0000 mg | ORAL_TABLET | Freq: Every evening | ORAL | Status: DC | PRN
  Administered 2014-03-31: 5 mg via ORAL
  Filled 2014-03-31: qty 1

## 2014-03-31 MED ORDER — FENTANYL 2.5 MCG/ML BUPIVACAINE 1/10 % EPIDURAL INFUSION (WH - ANES)
14.0000 mL/h | INTRAMUSCULAR | Status: DC | PRN
  Administered 2014-03-31: 14 mL/h via EPIDURAL
  Filled 2014-03-31: qty 125

## 2014-03-31 MED ORDER — SENNOSIDES-DOCUSATE SODIUM 8.6-50 MG PO TABS
2.0000 | ORAL_TABLET | ORAL | Status: DC
  Administered 2014-03-31 – 2014-04-01 (×2): 2 via ORAL
  Filled 2014-03-31 (×2): qty 2

## 2014-03-31 MED ORDER — SIMETHICONE 80 MG PO CHEW
80.0000 mg | CHEWABLE_TABLET | ORAL | Status: DC | PRN
Start: 2014-03-31 — End: 2014-04-02

## 2014-03-31 MED ORDER — LACTATED RINGERS IV SOLN: 500.0000 mL | Freq: Once | INTRAVENOUS | Status: DC

## 2014-03-31 MED ORDER — PNEUMOCOCCAL VAC POLYVALENT 25 MCG/0.5ML IJ INJ
0.5000 mL | INJECTION | INTRAMUSCULAR | Status: DC
  Filled 2014-03-31: qty 0.5

## 2014-03-31 MED ORDER — DIPHENHYDRAMINE HCL 50 MG/ML IJ SOLN: 12.5000 mg | INTRAMUSCULAR | Status: DC | PRN

## 2014-03-31 MED ORDER — LIDOCAINE HCL (PF) 1 % IJ SOLN
INTRAMUSCULAR | Status: DC | PRN
  Administered 2014-03-31 (×2): 4 mL

## 2014-03-31 MED ORDER — IBUPROFEN 600 MG PO TABS
600.0000 mg | ORAL_TABLET | Freq: Four times a day (QID) | ORAL | Status: DC
  Administered 2014-03-31 – 2014-04-02 (×8): 600 mg via ORAL
  Filled 2014-03-31 (×8): qty 1

## 2014-03-31 MED ORDER — PHENYLEPHRINE 40 MCG/ML (10ML) SYRINGE FOR IV PUSH (FOR BLOOD PRESSURE SUPPORT)
80.0000 ug | PREFILLED_SYRINGE | INTRAVENOUS | Status: DC | PRN
  Filled 2014-03-31: qty 2
  Filled 2014-03-31: qty 20

## 2014-03-31 MED ORDER — ONDANSETRON HCL 4 MG/2ML IJ SOLN: 4.0000 mg | INTRAMUSCULAR | Status: DC | PRN

## 2014-03-31 MED ORDER — ONDANSETRON HCL 4 MG PO TABS: 4.0000 mg | ORAL_TABLET | ORAL | Status: DC | PRN

## 2014-03-31 MED ORDER — LANOLIN HYDROUS EX OINT: TOPICAL_OINTMENT | CUTANEOUS | Status: DC | PRN

## 2014-03-31 MED ORDER — INFLUENZA VAC SPLIT QUAD 0.5 ML IM SUSY: 0.5000 mL | PREFILLED_SYRINGE | INTRAMUSCULAR | Status: DC

## 2014-03-31 MED ORDER — FENTANYL 2.5 MCG/ML BUPIVACAINE 1/10 % EPIDURAL INFUSION (WH - ANES)
INTRAMUSCULAR | Status: DC | PRN
  Administered 2014-03-31: 14 mL/h via EPIDURAL

## 2014-03-31 MED ORDER — BENZOCAINE-MENTHOL 20-0.5 % EX AERO
1.0000 "application " | INHALATION_SPRAY | CUTANEOUS | Status: DC | PRN
  Administered 2014-03-31: 1 via TOPICAL
  Filled 2014-03-31: qty 56

## 2014-03-31 MED ORDER — TETANUS-DIPHTH-ACELL PERTUSSIS 5-2.5-18.5 LF-MCG/0.5 IM SUSP: 0.5000 mL | Freq: Once | INTRAMUSCULAR | Status: DC

## 2014-03-31 NOTE — Progress Notes (Signed)
Trying to set up beacon, adj.
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
## 2014-03-31 NOTE — Lactation Note (Signed)
This note was copied from the chart of [name]. Lactation Consultation Note  Patient Name: [name] GLOVF'I Date: 03/31/2014 Reason for consult: Initial assessment   Follow-up consult at 30 hours old; GA 37.3; BW-5 lbs, 12.9 oz.  Infant has only had attempts with breastfeeding x3 (0-5 min) since birth; voids-1; stools-0.  Mom is P1 with history of bipolar d/o in remission, Hx of anxiety and OCD.  RN had the baby latched upon entering room but baby was just hanging out at the breast with no active sucking.  When we pulled her away she got very upset.   When trying to latch, she would latch with a few sucks and then lose the latch and get fussy.  Disorganized pattern noted.  Mom has flat nipples but nipples will erect with stimulation.   When sucking on gloved finger baby would take a few sucks and then bite.  After a few minutes baby began to suck with more rhythm.  Mom can easily hand express colostrum.  Several drops expressed to try to get baby to calm down for latching.  Attempted latching again but baby would not get into a rhythm and then lose latch. Discussed option of using a shield for establishing a sucking pattern and help baby stay latched.  Parents agreed to try it.   #20 Nipple Shield started.  Infant easily latched and immediately sucked with a consistent rhythm with lots of loud swallows heard with every 2-4 sucks; LS-8.  Taught mom how to use sandwiching breast and asymmetrical latching, encouraged to continue providing support of breast throughout feed.  Taught dad how to assist with feedings by using teacup hold and checking bottom lip for flanging.   After 30 minutes of breastfeeding infant came off breast satisfied, STS with mom on her shoulder.  Colostrum noted in shield.  Discussed using shield for next couple of feedings and then trying to feed without shield to see if baby will feed without it.  Instructed parents to use it if needed.  Parents happy  that baby fed so well. Educated on feeding cues, feeding 8-12 times per day, cluster feeding, using EBM on nipples after feeding for breast care.  Mom has shells in room for wearing between feeds.  Hand pump in room; sized for proper flange fit #24 correct size.   Gave spoons, colostrum collection container, and curved-tip syringe for feeding EBM if needed to help calm infant for latching.   DEBP kit given as requested by mom.  Mom stated she has a Medela DEBP at home for use after discharge.  Has WIC. Taught dad how to fill out yellow sheet and pointed out breastfeeding information in Baby & Me booklet.  Lots teaching done.   Encouraged parents to call for assistance if needed.     Maternal Data Formula Feeding for Exclusion: No Has patient been taught Hand Expression?: Yes Does the patient have breastfeeding experience prior to this delivery?: No  Feeding Feeding Type: Breast Fed  LATCH Score/Interventions Latch: Grasps breast easily, tongue down, lips flanged, rhythmical sucking. (with #20 nipple shield) Intervention(s): Teach feeding cues Intervention(s): Breast compression  Audible Swallowing: Spontaneous and intermittent Intervention(s): Skin to skin;Hand expression  Type of Nipple: Flat (everts with stimulation)  Comfort (Breast/Nipple): Soft / non-tender     Hold (Positioning): Assistance needed to correctly position infant at breast and maintain latch. Intervention(s): Breastfeeding basics reviewed;Support Pillows;Skin to skin  LATCH Score: 8  Lactation Tools Discussed/Used Tools: Nipple Shields Nipple  shield size: 20 Shell Type: Inverted Breast pump type: Manual WIC Program: Yes   Consult Status Consult Status: Follow-up Date: 04/01/14 Follow-up type: In-patient    Merlene Laughter 03/31/2014, 6:49 PM

## 2014-03-31 NOTE — Lactation Note (Signed)
This note was copied from the chart of [name]. Lactation Consultation Note  Patient Name: [name] RCBUL'A Date: 03/31/2014 Reason for consult: Initial assessment Baby 3 hours of life. Called to room for assistance with latching, but patient's nurse working with patient and asks if [facility] can visit later. Patient asking about being fitted properly for flange and needed a new pump kit for home use. Assured patient that both can be addressed when [facility] returns. Enc mom to call for LC at next feeding. Mom given [facility] brochure, aware of OP/BFSG, community resource, and [facility] phone line assistance after D/C.  Maternal Data    Feeding Feeding Type: Breast Fed Length of feed: 5 min  LATCH Score/Interventions Latch: Too sleepy or reluctant, no latch achieved, no sucking elicited. Intervention(s): Skin to skin Intervention(s): Adjust position;Assist with latch;Breast compression  Audible Swallowing: None Intervention(s): Skin to skin  Type of Nipple: Flat (semi)  Comfort (Breast/Nipple): Soft / non-tender     Hold (Positioning): Assistance needed to correctly position infant at breast and maintain latch. Intervention(s): Breastfeeding basics reviewed  LATCH Score: 4  Lactation Tools Discussed/Used Tools: Shells;Pump Shell Type: Inverted Breast pump type: Manual   Consult Status Consult Status: Follow-up Date: 04/01/14 Follow-up type: In-patient    Inocente Salles 03/31/2014, 5:12 PM

## 2014-03-31 NOTE — Progress Notes (Signed)
Provider notified that pt is extremely uncomfortable and itching with straps from monitors.  Bands placed over straps and still not helping.  Orders received.  Provider updated on cervix, pitocin dosage, vs

## 2014-03-31 NOTE — Anesthesia Preprocedure Evaluation (Addendum)
Anesthesia Evaluation  Patient identified by MRN, date of birth, ID band Patient awake    Reviewed: Allergy & Precautions, NPO status , Patient's Chart, lab work & pertinent test results, reviewed documented beta blocker date and time   History of Anesthesia Complications (+) PONV and history of anesthetic complications  Airway Mallampati: III  TM Distance: >3 FB Neck ROM: Full    Dental no notable dental hx. (+) Teeth Intact   Pulmonary asthma , former smoker,  breath sounds clear to auscultation  Pulmonary exam normal       Cardiovascular hypertension, Pt. on medications and Pt. on home beta blockers Rhythm:Regular Rate:Normal     Neuro/Psych  Headaches, PSYCHIATRIC DISORDERS Anxiety Depression Bipolar Disorder OCD   GI/Hepatic Neg liver ROS, GERD-  Medicated and Controlled,  Endo/Other  Morbid obesity  Renal/GU Renal diseaseHx/o ARF  negative genitourinary   Musculoskeletal negative musculoskeletal ROS (+)   Abdominal (+) + obese,   Peds  Hematology  (+) anemia ,   Anesthesia Other Findings Pierced tongue, nose and umbilicus  Reproductive/Obstetrics (+) Pregnancy PIH being induced 37 3/7 weeks GBS +                            Anesthesia Physical Anesthesia Plan  ASA: III  Anesthesia Plan: Epidural   Post-op Pain Management:    Induction:   Airway Management Planned: Natural Airway  Additional Equipment:   Intra-op Plan:   Post-operative Plan:   Informed Consent: I have reviewed the patients History and Physical, chart, labs and discussed the procedure including the risks, benefits and alternatives for the proposed anesthesia with the patient or authorized representative who has indicated his/her understanding and acceptance.     Plan Discussed with: Anesthesiologist  Anesthesia Plan Comments:         Anesthesia Quick Evaluation

## 2014-03-31 NOTE — Anesthesia Procedure Notes (Addendum)
Epidural Patient location during procedure: OB Start time: 03/31/2014 9:25 AM  Staffing Anesthesiologist: Josephine Igo Performed by: anesthesiologist   Preanesthetic Checklist Completed: patient identified, site marked, surgical consent, pre-op evaluation, timeout performed, IV checked, risks and benefits discussed and monitors and equipment checked  Epidural Patient position: sitting Prep: site prepped and draped and DuraPrep Patient monitoring: continuous pulse ox and blood pressure Approach: midline Location: L3-L4 Injection technique: LOR air  Needle:  Needle type: Tuohy  Needle gauge: 17 G Needle length: 9 cm and 9 Needle insertion depth: 6 cm Catheter type: closed end flexible Catheter size: 19 Gauge Catheter at skin depth: 11 cm Test dose: negative and Other  Assessment Events: blood not aspirated, injection not painful, no injection resistance, negative IV test and no paresthesia  Additional Notes Patient identified. Risks and benefits discussed including failed block, incomplete  Pain control, post dural puncture headache, nerve damage, paralysis, blood pressure Changes, nausea, vomiting, reactions to medications-both toxic and allergic and post Partum back pain. All questions were answered. Patient expressed understanding and wished to proceed. Sterile technique was used throughout procedure. Epidural site was Dressed with sterile barrier dressing. No paresthesias, signs of intravascular injection Or signs of intrathecal spread were encountered.  Patient was more comfortable after the epidural was dosed. Please see RN's note for documentation of vital signs and FHR which are stable.

## 2014-04-01 LAB — CBC
HEMATOCRIT: 35.1 % — AB (ref 36.0–46.0)
HEMOGLOBIN: 12.1 g/dL (ref 12.0–15.0)
MCH: 31.5 pg (ref 26.0–34.0)
MCHC: 34.5 g/dL (ref 30.0–36.0)
MCV: 91.4 fL (ref 78.0–100.0)
Platelets: 234 10*3/uL (ref 150–400)
RBC: 3.84 MIL/uL — AB (ref 3.87–5.11)
RDW: 13.9 % (ref 11.5–15.5)
WBC: 16.7 10*3/uL — ABNORMAL HIGH (ref 4.0–10.5)

## 2014-04-01 NOTE — Progress Notes (Signed)
UR chart review completed.  

## 2014-04-01 NOTE — Lactation Note (Signed)
This note was copied from the chart of Maria Debra Colon. Lactation Consultation Note Mom states BF going better using the NS #24. Nipples are sore, but doesn't hurt w/NS. Has shells for inverted nipples. RN gave DEBP for post pumping. Hand expression demonstrated to mom colostrum. Encouraged to rub on nipples for healing. Comfort gels given.  Baby looks slightly jaundice. Encouraged to wake baby for feedings at least every three hours if baby hasn't cued for feedings before then.  Encouraged to massage breast at intervals during BF to express more colostrum to baby.  Monitoring I&O encouraged. Patient Name: [name] [id number] Date: 04/01/2014 Reason for consult: Follow-up assessment   Maternal Data    Feeding Feeding Type: Breast Fed Length of feed: 20 min  LATCH Score/Interventions Latch: Grasps breast easily, tongue down, lips flanged, rhythmical sucking.  Audible Swallowing: Spontaneous and intermittent  Type of Nipple: Everted at rest and after stimulation Intervention(s): Shells;Double electric pump  Comfort (Breast/Nipple): Filling, red/small blisters or bruises, mild/mod discomfort  Problem noted: Mild/Moderate discomfort;Cracked, bleeding, blisters, bruises Interventions  (Cracked/bleeding/bruising/blister): Double electric pump Interventions (Mild/moderate discomfort): Comfort gels;Breast shields;Post-pump;Hand massage;Hand expression  Hold (Positioning): No assistance needed to correctly position infant at breast. Intervention(s): Position options;Skin to skin;Support Pillows  LATCH Score: 10  Lactation Tools Discussed/Used Tools: Shells;Nipple Shields;Pump;Comfort gels Nipple shield size: 24 Shell Type: Inverted Breast pump type: Double-Electric Breast Pump Pump Review: Setup, frequency, and cleaning;Milk Storage Initiated by:: RN Date initiated:: 04/01/14   Consult Status Consult Status: Follow-up Date: 04/02/14 Follow-up type:  In-patient    Theodoro Kalata 04/01/2014, 5:25 PM

## 2014-04-01 NOTE — Progress Notes (Signed)
Patient is eating, ambulating, voiding.  Pain control is good.  Filed Vitals:   03/31/14 1648 03/31/14 2050 04/01/14 0316 04/01/14 0543  BP: 132/83 131/83 140/88 108/73  Pulse: 105 90 78 78  Temp: 99.1 F (37.3 C) 98.8 F (37.1 C) 97.9 F (36.6 C) 98 F (36.7 C)  TempSrc: Oral Oral Oral Oral  Resp: 18 18 18 18   Height:      Weight:      SpO2: 97% 98% 97% 100%    Fundus firm Perineum without swelling.  Lab Results  Component Value Date   WBC 16.7* 04/01/2014   HGB 12.1 04/01/2014   HCT 35.1* 04/01/2014   MCV 91.4 04/01/2014   PLT 234 04/01/2014    --/--/A POS, A POS (03/13 2115)/RI  A/P Post partum day 1.  Routine care.  Expect d/c routine.    Dianca Owensby A

## 2014-04-01 NOTE — Progress Notes (Signed)
CSW completed assessment due to history of bipolar and anxiety.  [name] presented as easily engaged and receptive to the visit.  Full documentation of the assessment to follow.  No barriers to discharge.

## 2014-04-01 NOTE — Discharge Summary (Signed)
Obstetric Discharge Summary Reason for Admission: induction of labor Prenatal Procedures: none and NST Intrapartum Procedures: spontaneous vaginal delivery Postpartum Procedures: none Complications-Operative and Postpartum: 2 degree perineal laceration HEMOGLOBIN  Date Value Ref Range Status  04/01/2014 12.1 12.0 - 15.0 g/dL Final   HCT  Date Value Ref Range Status  04/01/2014 35.1* 36.0 - 46.0 % Final  01/25/2012 42 % Final    Discharge Diagnoses: Term Pregnancy-delivered  Discharge Information: Date: 04/01/2014 Activity: pelvic rest Diet: routine Medications: Ibuprofen and Iron Condition: stable Instructions: refer to practice specific booklet Discharge to: home Follow-up Information    Follow up with CALLAHAN, SIDNEY, DO.   Specialty:  Obstetrics and Gynecology   Contact information:   [address] [place] [address] [phone]       Newborn Data: Live born female  Birth Weight: 5 lb 12.9 oz (2634 g) APGAR: 9, 9  Home with mother.  Tevis Dunavan A 04/01/2014, 7:53 AM

## 2014-04-01 NOTE — Anesthesia Postprocedure Evaluation (Signed)
Anesthesia Post Note  Patient: [name]  Procedure(s) Performed: * No procedures listed *  Anesthesia type: Epidural  Patient location: Mother/Baby  Post pain: Pain level controlled  Post assessment: Post-op Vital signs reviewed  Last Vitals:  Filed Vitals:   04/01/14 0543  BP: 108/73  Pulse: 78  Temp: 36.7 C  Resp: 18    Post vital signs: Reviewed  Level of consciousness: awake  Complications: No apparent anesthesia complications

## 2014-04-02 NOTE — Lactation Note (Signed)
This note was copied from the chart of Maria Abrey Bradway. Lactation Consultation Note  Mom requested a 3rd nipple shield because hers had been taken to the car and she thought the baby was getting hungry.  The baby was sleeping and RN was preparing them for discharge.  I requested that the FOB retrieve the supplies from the car in the event that the baby began to get hungry. Baby has also been receiving formula from bottles. Patient Name: [name] [id number] Date: 04/02/2014     Maternal Data    Feeding    LATCH Score/Interventions                      Lactation Tools Discussed/Used     Consult Status      Van Clines 04/02/2014, 12:29 PM

## 2014-04-02 NOTE — Lactation Note (Signed)
This note was copied from the chart of Maria Josilyn Shippee. Lactation Consultation Note  Baby has been supplementing with formula. Baby occasionally latches to the breast with a NS.  We discussed pumping and how to bring milk to volume.  Storage of BM, formula prep and hand expression were all reviewed.  Plan is to BF,supplement per late preterm guidelines, and pump to bring milk to volume.  She desires to call us if an outpatient appointment is needed.  I advised her that if baby was consistently feeding with the nipple shield weight should be monitored closely until consistent weight gain was evident.  Mom has our brochure and is aware of support group and outpatient services.  Patient Name: [name] Date: 04/02/2014     Maternal Data    Feeding    LATCH Score/Interventions                      Lactation Tools Discussed/Used     Consult Status      Van Clines 04/02/2014, 10:45 AM
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
## 2014-04-02 NOTE — Discharge Summary (Signed)
Obstetric Discharge Summary Reason for Admission: induction of labor, GHTN Prenatal Procedures: ultrasound Intrapartum Procedures: spontaneous vaginal delivery Postpartum Procedures: none Complications-Operative and Postpartum: 2nd degree perineal laceration HEMOGLOBIN  Date Value Ref Range Status  04/01/2014 12.1 12.0 - 15.0 g/dL Final   HCT  Date Value Ref Range Status  04/01/2014 35.1* 36.0 - 46.0 % Final  01/25/2012 42 % Final    Physical Exam:  General: alert and cooperative Lochia: appropriate Uterine Fundus: firm DVT Evaluation: No evidence of DVT seen on physical exam.  Discharge Diagnoses: Term Pregnancy-delivered  Discharge Information: Date: 04/02/2014 Activity: pelvic rest Diet: routine Medications: PNV and Ibuprofen Condition: stable Instructions: refer to practice specific booklet Discharge to: home Follow-up Information    Follow up with Maria Ambrocio, DO.   Specialty:  Obstetrics and Gynecology   Contact information:   [place] [address] [phone]       Follow up with Maria Kenner, DO In 1 week.   Specialty:  Obstetrics and Gynecology   Why:  BP check   Contact information:   [place] [address] [phone]       Newborn Data: Live born female  Birth Weight: 5 lb 12.9 oz (2634 g) APGAR: 9, 9  Home with mother.  [name] 04/02/2014, 10:58 AM

## 2014-04-04 ENCOUNTER — Encounter (HOSPITAL_COMMUNITY): Payer: Self-pay

## 2014-04-04 ENCOUNTER — Inpatient Hospital Stay (HOSPITAL_COMMUNITY)
Admission: AD | Admit: 2014-04-04 | Discharge: 2014-04-04 | Disposition: A | Discharge status: Home / Self Care | Payer: Medicare Other | Source: Ambulatory Visit | Attending: Obstetrics | Admitting: Obstetrics

## 2014-04-04 DIAGNOSIS — R600 Localized edema: Secondary | ICD-10-CM | POA: Diagnosis not present

## 2014-04-04 DIAGNOSIS — I158 Other secondary hypertension: Secondary | ICD-10-CM | POA: Insufficient documentation

## 2014-04-04 DIAGNOSIS — Z87891 Personal history of nicotine dependence: Secondary | ICD-10-CM | POA: Diagnosis not present

## 2014-04-04 DIAGNOSIS — R51 Headache: Secondary | ICD-10-CM | POA: Diagnosis not present

## 2014-04-04 DIAGNOSIS — O165 Unspecified maternal hypertension, complicating the puerperium: Secondary | ICD-10-CM

## 2014-04-04 DIAGNOSIS — O1205 Gestational edema, complicating the puerperium: Secondary | ICD-10-CM

## 2014-04-04 DIAGNOSIS — O9089 Other complications of the puerperium, not elsewhere classified: Secondary | ICD-10-CM | POA: Insufficient documentation

## 2014-04-04 DIAGNOSIS — R519 Headache, unspecified: Secondary | ICD-10-CM

## 2014-04-04 LAB — URINALYSIS, ROUTINE W REFLEX MICROSCOPIC
Bilirubin Urine: NEGATIVE
Glucose, UA: NEGATIVE mg/dL
Ketones, ur: NEGATIVE mg/dL
Nitrite: NEGATIVE
PH: 7 (ref 5.0–8.0)
Protein, ur: NEGATIVE mg/dL
SPECIFIC GRAVITY, URINE: 1.02 (ref 1.005–1.030)
Urobilinogen, UA: 0.2 mg/dL (ref 0.0–1.0)

## 2014-04-04 LAB — PROTEIN / CREATININE RATIO, URINE
Creatinine, Urine: 183 mg/dL
Protein Creatinine Ratio: 0.12 (ref 0.00–0.15)
Total Protein, Urine: 22 mg/dL

## 2014-04-04 LAB — COMPREHENSIVE METABOLIC PANEL
ALT: 30 U/L (ref 0–35)
AST: 37 U/L (ref 0–37)
Albumin: 2.9 g/dL — ABNORMAL LOW (ref 3.5–5.2)
Alkaline Phosphatase: 87 U/L (ref 39–117)
Anion gap: 7 (ref 5–15)
BILIRUBIN TOTAL: 0.3 mg/dL (ref 0.3–1.2)
BUN: 7 mg/dL (ref 6–23)
CHLORIDE: 104 mmol/L (ref 96–112)
CO2: 27 mmol/L (ref 19–32)
CREATININE: 0.71 mg/dL (ref 0.50–1.10)
Calcium: 8.6 mg/dL (ref 8.4–10.5)
GFR calc non Af Amer: >90 mL/min (ref >90)
Glucose, Bld: 98 mg/dL (ref 70–99)
Potassium: 3.9 mmol/L (ref 3.5–5.1)
SODIUM: 138 mmol/L (ref 135–145)
TOTAL PROTEIN: 6.5 g/dL (ref 6.0–8.3)

## 2014-04-04 LAB — CBC
HCT: 36.6 % (ref 36.0–46.0)
Hemoglobin: 12.5 g/dL (ref 12.0–15.0)
MCH: 31.3 pg (ref 26.0–34.0)
MCHC: 34.2 g/dL (ref 30.0–36.0)
MCV: 91.5 fL (ref 78.0–100.0)
Platelets: 274 10*3/uL (ref 150–400)
RBC: 4 MIL/uL (ref 3.87–5.11)
RDW: 13.7 % (ref 11.5–15.5)
WBC: 9.4 10*3/uL (ref 4.0–10.5)

## 2014-04-04 LAB — URINE MICROSCOPIC-ADD ON

## 2014-04-04 LAB — LACTATE DEHYDROGENASE: LDH: 213 U/L (ref 94–250)

## 2014-04-04 LAB — URIC ACID: URIC ACID, SERUM: 5.7 mg/dL (ref 2.4–7.0)

## 2014-04-04 MED ORDER — DOXYCYCLINE HYCLATE 100 MG PO CAPS: 100.0000 mg | ORAL_CAPSULE | Freq: Two times a day (BID) | ORAL | Status: DC

## 2014-04-04 MED ORDER — OXYCODONE-ACETAMINOPHEN 5-325 MG PO TABS: 1.0000 | ORAL_TABLET | Freq: Four times a day (QID) | ORAL | Status: DC | PRN

## 2014-04-04 MED ORDER — IBUPROFEN 600 MG PO TABS: 600.0000 mg | ORAL_TABLET | Freq: Four times a day (QID) | ORAL | Status: DC | PRN

## 2014-04-04 MED ORDER — OXYCODONE-ACETAMINOPHEN 5-325 MG PO TABS
1.0000 | ORAL_TABLET | Freq: Once | ORAL | Status: AC
  Administered 2014-04-04: 2 via ORAL
  Filled 2014-04-04: qty 2

## 2014-04-04 NOTE — Discharge Instructions (Signed)
.  Hypertension During Pregnancy Hypertension, or high blood pressure, is when there is extra pressure inside your blood vessels that carry blood from the heart to the rest of your body (arteries). It can happen at any time in life, including pregnancy. Hypertension during pregnancy can cause problems for you and your baby. Your baby might not weigh as much as he or she should at birth or might be born early (premature). Very bad cases of hypertension during pregnancy can be life-threatening.  Different types of hypertension can occur during pregnancy. These include:  Chronic hypertension. This happens when a woman has hypertension before pregnancy and it continues during pregnancy.  Gestational hypertension. This is when hypertension develops during pregnancy.  Preeclampsia or toxemia of pregnancy. This is a very serious type of hypertension that develops only during pregnancy. It affects the whole body and can be very dangerous for both mother and baby.  Gestational hypertension and preeclampsia usually go away after your baby is born. Your blood pressure will likely stabilize within 6 weeks. Women who have hypertension during pregnancy have a greater chance of developing hypertension later in life or with future pregnancies. RISK FACTORS There are certain factors that make it more likely for you to develop hypertension during pregnancy. These include:  Having hypertension before pregnancy.  Having hypertension during a previous pregnancy.  Being overweight.  Being older than 40 years.  Being pregnant with more than one baby.  Having diabetes or kidney problems. SIGNS AND SYMPTOMS Chronic and gestational hypertension rarely cause symptoms. Preeclampsia has symptoms, which may include:  Increased protein in your urine. Your health care provider will check for this at every prenatal visit.  Swelling of your hands and face.  Rapid weight gain.  Headaches.  Visual changes.  Being  bothered by light.  Abdominal pain, especially in the upper right area.  Chest pain.  Shortness of breath.  Increased reflexes.  Seizures. These occur with a more severe form of preeclampsia, called eclampsia. DIAGNOSIS  You may be diagnosed with hypertension during a regular prenatal exam. At each prenatal visit, you may have:  Your blood pressure checked.  A urine test to check for protein in your urine. The type of hypertension you are diagnosed with depends on when you developed it. It also depends on your specific blood pressure reading.  Developing hypertension before 20 weeks of pregnancy is consistent with chronic hypertension.  Developing hypertension after 20 weeks of pregnancy is consistent with gestational hypertension.  Hypertension with increased urinary protein is diagnosed as preeclampsia.  Blood pressure measurements that stay above 400 systolic or 867 diastolic are a sign of severe preeclampsia. TREATMENT Treatment for hypertension during pregnancy varies. Treatment depends on the type of hypertension and how serious it is.  If you take medicine for chronic hypertension, you may need to switch medicines.  Medicines called ACE inhibitors should not be taken during pregnancy.  Low-dose aspirin may be suggested for women who have risk factors for preeclampsia.  If you have gestational hypertension, you may need to take a blood pressure medicine that is safe during pregnancy. Your health care provider will recommend the correct medicine.  If you have severe preeclampsia, you may need to be in the hospital. Health care providers will watch you and your baby very closely. You also may need to take medicine called magnesium sulfate to prevent seizures and lower blood pressure.  Sometimes, an early delivery is needed. This may be the case if the condition worsens. It  would be done to protect you and your baby. The only cure for preeclampsia is delivery.  Your health  care provider may recommend that you take one low-dose aspirin (81 mg) each day to help prevent high blood pressure during your pregnancy if you are at risk for preeclampsia. You may be at risk for preeclampsia if:  You had preeclampsia or eclampsia during a previous pregnancy.  Your baby did not grow as expected during a previous pregnancy.  You experienced preterm birth with a previous pregnancy.  You experienced a separation of the placenta from the uterus (placental abruption) during a previous pregnancy.  You experienced the loss of your baby during a previous pregnancy.  You are pregnant with more than one baby.  You have other medical conditions, such as diabetes or an autoimmune disease. HOME CARE INSTRUCTIONS  Schedule and keep all of your regular prenatal care appointments. This is important.  Take medicines only as directed by your health care provider. Tell your health care provider about all medicines you take.  Eat as little salt as possible.  Get regular exercise.  Do not drink alcohol.  Do not use tobacco products.  Do not drink products with caffeine.  Lie on your left side when resting. SEEK IMMEDIATE MEDICAL CARE IF:  You have severe abdominal pain.  You have sudden swelling in your hands, ankles, or face.  You gain 4 pounds (1.8 kg) or more in 1 week.  You vomit repeatedly.  You have vaginal bleeding.  You do not feel your baby moving as much.  You have a headache.  You have blurred or double vision.  You have muscle twitching or spasms.  You have shortness of breath.  You have blue fingernails or lips.  You have blood in your urine. MAKE SURE YOU:  Understand these instructions.  Will watch your condition.  Will get help right away if you are not doing well or get worse. Document Released: 09/21/2010 Document Revised: 05/20/2013 Document Reviewed: 08/02/2012 [facility] Patient Information 2015 [place], [place]. This information is not  intended to replace advice given to you by your health care provider. Make sure you discuss any questions you have with your health care provider.  Edema Edema is an abnormal buildup of fluids in your bodytissues. Edema is somewhatdependent on gravity to pull the fluid to the lowest place in your body. That makes the condition more common in the legs and thighs (lower extremities). Painless swelling of the feet and ankles is common and becomes more likely as you get older. It is also common in looser tissues, like around your eyes.  When the affected area is squeezed, the fluid may move out of that spot and leave a dent for a few moments. This dent is called pitting.  CAUSES  There are many possible causes of edema. Eating too much salt and being on your feet or sitting for a long time can cause edema in your legs and ankles. Hot weather may make edema worse. Common medical causes of edema include:  Heart failure.  Liver disease.  Kidney disease.  Weak blood vessels in your legs.  Cancer.  An injury.  Pregnancy.  Some medications.  Obesity. SYMPTOMS  Edema is usually painless.Your skin may look swollen or shiny.  DIAGNOSIS  Your health care provider may be able to diagnose edema by asking about your medical history and doing a physical exam. You may need to have tests such as X-rays, an electrocardiogram, or blood tests to  check for medical conditions that may cause edema.  TREATMENT  Edema treatment depends on the cause. If you have heart, liver, or kidney disease, you need the treatment appropriate for these conditions. General treatment may include:  Elevation of the affected body part above the level of your heart.  Compression of the affected body part. Pressure from elastic bandages or support stockings squeezes the tissues and forces fluid back into the blood vessels. This keeps fluid from entering the tissues.  Restriction of fluid and salt intake.  Use of a water  pill (diuretic). These medications are appropriate only for some types of edema. They pull fluid out of your body and make you urinate more often. This gets rid of fluid and reduces swelling, but diuretics can have side effects. Only use diuretics as directed by your health care provider. HOME CARE INSTRUCTIONS   Keep the affected body part above the level of your heart when you are lying down.   Do not sit still or stand for prolonged periods.   Do not put anything directly under your knees when lying down.  Do not wear constricting clothing or garters on your upper legs.   Exercise your legs to work the fluid back into your blood vessels. This may help the swelling go down.   Wear elastic bandages or support stockings to reduce ankle swelling as directed by your health care provider.   Eat a low-salt diet to reduce fluid if your health care provider recommends it.   Only take medicines as directed by your health care provider. SEEK MEDICAL CARE IF:   Your edema is not responding to treatment.  You have heart, liver, or kidney disease and notice symptoms of edema.  You have edema in your legs that does not improve after elevating them.   You have sudden and unexplained weight gain. SEEK IMMEDIATE MEDICAL CARE IF:   You develop shortness of breath or chest pain.   You cannot breathe when you lie down.  You develop pain, redness, or warmth in the swollen areas.   You have heart, liver, or kidney disease and suddenly get edema.  You have a fever and your symptoms suddenly get worse. MAKE SURE YOU:   Understand these instructions.  Will watch your condition.  Will get help right away if you are not doing well or get worse. Document Released: 01/03/2005 Document Revised: 05/20/2013 Document Reviewed: 10/26/2012 [facility] Patient Information 2015 [place], [place]. This information is not intended to replace advice given to you by your health care provider. Make sure  you discuss any questions you have with your health care provider.

## 2014-04-04 NOTE — MAU Note (Signed)
Pt presents complaining of headache, swelling in her lower extremities, RUQ pain, and left arm swelling. Pt also having floaters. States Tylenol does not help headache. Rates it 9/10.
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
## 2014-04-08 DIAGNOSIS — R030 Elevated blood-pressure reading, without diagnosis of hypertension: Secondary | ICD-10-CM | POA: Diagnosis not present

## 2014-04-29 DIAGNOSIS — Z3042 Encounter for surveillance of injectable contraceptive: Secondary | ICD-10-CM | POA: Diagnosis not present

## 2014-05-12 DIAGNOSIS — K602 Anal fissure, unspecified: Secondary | ICD-10-CM | POA: Diagnosis not present

## 2014-05-12 DIAGNOSIS — K625 Hemorrhage of anus and rectum: Secondary | ICD-10-CM | POA: Diagnosis not present

## 2014-05-12 DIAGNOSIS — R1012 Left upper quadrant pain: Secondary | ICD-10-CM | POA: Diagnosis not present

## 2014-05-19 ENCOUNTER — Other Ambulatory Visit: Payer: Self-pay | Admitting: Surgery

## 2014-05-19 DIAGNOSIS — K648 Other hemorrhoids: Secondary | ICD-10-CM | POA: Diagnosis not present

## 2014-05-19 NOTE — H&P (Signed)
[name] 05/19/2014 11:26 AM Location: [facility] Patient #: [id number] DOB: [birth year] Single / Language: [place] / Race: White Female History of Present Illness Ardeth Sportsman MD; 05/19/2014 2:04 PM) Patient words: anal tags.  The patient is a 29 year old female who presents with anal itching. Patient sent by her gastroenterologist, Dr. Elnoria Howard, for concern of persistent anal burning and irritation with hemorrhoids. Pleasant obese female. 2 months postpartum. His always felt an external hemorrhoid/anal tag her to most of her life. Often gets itching and irritation with it. Her to keep the area clean. Has bowel movements most every days. Rational of slightly irregular bowels with occasional constipation and diarrhea. Discussion but no true diagnosis of irritable bowel. With her pregnancy, her hemorrhoids became worse. She tolerated the episiotomy but still has irritation discomfort from her hemorrhoids. Wished to have them removed. No history of Crohn's, ulcerative colitis. No major rectal bleeding. No fevers or chills. No prior anorectal interventions. She no longer smokes. Other Problems Gilmer Mor, CMA; 05/19/2014 11:26 AM) Anxiety Disorder Asthma Back Pain Depression Hemorrhoids Migraine Headache  Past Surgical History Gilmer Mor, CMA; 05/19/2014 11:26 AM) Colon Polyp Removal - Colonoscopy Oral Surgery  Diagnostic Studies History Gilmer Mor, CMA; 05/19/2014 11:26 AM) Colonoscopy 1-5 years ago Mammogram never  Allergies Lamar Laundry Bynum, CMA; 05/19/2014 11:27 AM) Amoxicillin *PENICILLINS*  Medication History (Sonya Bynum, CMA; 05/19/2014 11:27 AM) No Current Medications Medications Reconciled  Social History Gilmer Mor, CMA; 05/19/2014 11:26 AM) Alcohol use Occasional alcohol use. Caffeine use Carbonated beverages. No drug use Tobacco use Current some day smoker.  Family History Gilmer Mor, CMA; 05/19/2014 11:26 AM) Arthritis  Father. Bleeding disorder Father. Diabetes Mellitus Father. Heart Disease Father. Heart disease in female family member before age 12 Hypertension Father. Migraine Headache Mother. Thyroid problems Mother.  Pregnancy / Birth History Gilmer Mor, CMA; 05/19/2014 11:26 AM) Age at menarche 12 years. Contraceptive History Depo-provera. Gravida 2 Maternal age 67-25 Para 1 Regular periods     Review of Systems Lamar Laundry Bynum CMA; 05/19/2014 11:26 AM) General Present- Weight Gain. Not Present- Appetite Loss, Chills, Fatigue, Fever, Night Sweats and Weight Loss. Skin Not Present- Change in Wart/Mole, Dryness, Hives, Jaundice, New Lesions, Non-Healing Wounds, Rash and Ulcer. HEENT Present- Seasonal Allergies and Wears glasses/contact lenses. Not Present- Earache, Hearing Loss, Hoarseness, Nose Bleed, Oral Ulcers, Ringing in the Ears, Sinus Pain, Sore Throat, Visual Disturbances and Yellow Eyes. Respiratory Not Present- Bloody sputum, Chronic Cough, Difficulty Breathing, Snoring and Wheezing. Breast Not Present- Breast Mass, Breast Pain, Nipple Discharge and Skin Changes. Cardiovascular Not Present- Chest Pain, Difficulty Breathing Lying Down, Leg Cramps, Palpitations, Rapid Heart Rate, Shortness of Breath and Swelling of Extremities. Gastrointestinal Present- Hemorrhoids. Not Present- Abdominal Pain, Bloating, Bloody Stool, Change in Bowel Habits, Chronic diarrhea, Constipation, Difficulty Swallowing, Excessive gas, Gets full quickly at meals, Indigestion, Nausea, Rectal Pain and Vomiting. Female Genitourinary Not Present- Frequency, Nocturia, Painful Urination, Pelvic Pain and Urgency. Musculoskeletal Present- Back Pain. Not Present- Joint Pain, Joint Stiffness, Muscle Pain, Muscle Weakness and Swelling of Extremities. Neurological Not Present- Decreased Memory, Fainting, Headaches, Numbness, Seizures, Tingling, Tremor, Trouble walking and Weakness. Psychiatric Present- Anxiety and  Bipolar. Not Present- Change in Sleep Pattern, Depression, Fearful and Frequent crying. Endocrine Not Present- Cold Intolerance, Excessive Hunger, Hair Changes, Heat Intolerance, Hot flashes and New Diabetes. Hematology Not Present- Easy Bruising, Excessive bleeding, Gland problems, HIV and Persistent Infections.  Vitals (Sonya Bynum CMA; 05/19/2014 11:27 AM) 05/19/2014 11:26 AM Weight: 237 lb Height: 68in Body Surface  Area: 2.27 m Body Mass Index: 36.04 kg/m Temp.: 97.70F(Temporal)  Pulse: 93 (Regular)  BP: 136/80 (Sitting, Left Arm, Standard)     Physical Exam Ardeth Sportsman MD; 05/19/2014 11:54 AM)  General Mental Status-Alert. General Appearance-Not in acute distress, Not Sickly. Orientation-Oriented X3. Hydration-Well hydrated. Voice-Normal.  Integumentary Global Assessment Upon inspection and palpation of skin surfaces of the - Axillae: non-tender, no inflammation or ulceration, no drainage. and Distribution of scalp and body hair is normal. General Characteristics Temperature - normal warmth is noted. Note: Numerous piercings including ears, tongue, and bellybutton   Head and Neck Head-normocephalic, atraumatic with no lesions or palpable masses. Face Global Assessment - atraumatic, no absence of expression. Neck Global Assessment - no abnormal movements, no bruit auscultated on the right, no bruit auscultated on the left, no decreased range of motion, non-tender. Trachea-midline. Thyroid Gland Characteristics - non-tender.  Eye Eyeball - Left-Extraocular movements intact, No Nystagmus. Eyeball - Right-Extraocular movements intact, No Nystagmus. Cornea - Left-No Hazy. Cornea - Right-No Hazy. Sclera/Conjunctiva - Left-No scleral icterus, No Discharge. Sclera/Conjunctiva - Right-No scleral icterus, No Discharge. Pupil - Left-Direct reaction to light normal. Pupil - Right-Direct reaction to light  normal.  ENMT Ears Pinna - Left - no drainage observed, no generalized tenderness observed. Right - no drainage observed, no generalized tenderness observed. Nose and Sinuses External Inspection of the Nose - no destructive lesion observed. Inspection of the nares - Left - quiet respiration. Right - quiet respiration. Mouth and Throat Lips - Upper Lip - no fissures observed, no pallor noted. Lower Lip - no fissures observed, no pallor noted. Nasopharynx - no discharge present. Oral Cavity/Oropharynx - Tongue - no dryness observed. Oral Mucosa - no cyanosis observed. Hypopharynx - no evidence of airway distress observed.  Chest and Lung Exam Inspection Movements - Normal and Symmetrical. Accessory muscles - No use of accessory muscles in breathing. Palpation Palpation of the chest reveals - Non-tender. Auscultation Breath sounds - Normal and Clear.  Cardiovascular Auscultation Rhythm - Regular. Murmurs & Other Heart Sounds - Auscultation of the heart reveals - No Murmurs and No Systolic Clicks.  Abdomen Inspection Inspection of the abdomen reveals - No Visible peristalsis and No Abnormal pulsations. Umbilicus - No Bleeding, No Urine drainage. Palpation/Percussion Palpation and Percussion of the abdomen reveal - Soft, Non Tender, No Rebound tenderness, No Rigidity (guarding) and No Cutaneous hyperesthesia. Note: Soft. No diastases. No umbilical hernia. No irritation around supraumbilical piercing   Female Genitourinary Note: No inguinal hernias. No major vaginal bleeding / discharge   Rectal Note: Perianal skin clear. No pruritus. Obvious anterior midline prolapsed internal/external hemorrhoid going to midline raphae. Small posterior slightly lateral anal tag with some giving suspicious for mostly adhered fissure/fistula. Normal sphincter tone. Mildly enlarged internal hemorrhoids but aside from RIGHT anterior/anterior internal partially prolapsed hemorrhoid, not major. No  pruritus. No fissure. No abscess. No other fistula.   Peripheral Vascular Upper Extremity Inspection - Left - No Cyanotic nailbeds, Not Ischemic. Right - No Cyanotic nailbeds, Not Ischemic.  Neurologic Neurologic evaluation reveals -normal attention span and ability to concentrate, able to name objects and repeat phrases. Appropriate fund of knowledge , normal sensation and normal coordination. Mental Status Affect - not angry, not paranoid. Cranial Nerves-Normal Bilaterally. Gait-Normal.  Neuropsychiatric Mental status exam performed with findings of-able to articulate well with normal speech/language, rate, volume and coherence, thought content normal with ability to perform basic computations and apply abstract reasoning and no evidence of hallucinations, delusions, obsessions or homicidal/suicidal ideation.  Musculoskeletal Global Assessment Spine, Ribs and Pelvis - no instability, subluxation or laxity. Right Upper Extremity - no instability, subluxation or laxity.  Lymphatic Head & Neck  General Head & Neck Lymphatics: Bilateral - Description - No Localized lymphadenopathy. Axillary  General Axillary Region: Bilateral - Description - No Localized lymphadenopathy. Femoral & Inguinal  Generalized Femoral & Inguinal Lymphatics: Left - Description - No Localized lymphadenopathy. Right - Description - No Localized lymphadenopathy.    Assessment & Plan Ardeth Sportsman MD; 05/19/2014 12:08 PM)  PROLAPSED INTERNAL HEMORRHOIDS, GRADE 4 (455.2  K64.8) Impression: RIGHT anterior/anterior midline chronically prolapsed internal/external hemorrhoid. Think is benefit from removal. Would also remove LEFT posterior lateral tag/superficial fistula as well. Outpatient surgery.  Did caution her that this is a painful recovery & to stay aggressive on pain control and bowel regimen. She is motivated. She has support with her husband. Wished to proceed.  Current Plans The anatomy &  physiology of the anorectal region was discussed. The pathophysiology of hemorrhoids and differential diagnosis was discussed. Natural history risks without surgery was discussed. I stressed the importance of a bowel regimen to have daily soft bowel movements to minimize progression of disease. Interventions such as sclerotherapy & banding were discussed.  The patient's symptoms are not adequately controlled by medicines and other non-operative treatments. I feel the risks & problems of no surgery outweigh the operative risks; therefore, I recommended surgery to treat the hemorrhoids by ligation, pexy, and possible resection.  Risks such as bleeding, infection, urinary difficulties, need for further treatment, heart attack, death, and other risks were discussed. I noted a good likelihood this will help address the problem. Goals of post-operative recovery were discussed as well. Possibility that this will not correct all symptoms was explained. Post-operative pain, bleeding, constipation, and other problems after surgery were discussed. We will work to minimize complications. Educational handouts further explaining the pathology, treatment options, and bowel regimen were given as well. Questions were answered. The patient expresses understanding & wishes to proceed with surgery. Schedule for Surgery Written instructions provided Pt Education - [facility] Hemorrhoids (Taten Merrow) Pt Education - [facility] Rectal Surgery HCI (Kylan Liberati): discussed with patient and provided information. Pt Education - [facility] Good Bowel Health (Karma Ansley) Pt Education - [facility] Pain Control (Hesston Hitchens) Pt Education - [facility] Pelvic Floor Exercises (Kegels) and Dysfunction HCI (Zamariyah Furukawa)  Ardeth Sportsman, M.D., F.A.C.S. Gastrointestinal and Minimally Invasive Surgery [facility] [address], [address] [place], [place] [address] [phone] Main / Paging

## 2014-06-04 DIAGNOSIS — N649 Disorder of breast, unspecified: Secondary | ICD-10-CM | POA: Diagnosis not present

## 2014-06-13 ENCOUNTER — Ambulatory Visit (HOSPITAL_COMMUNITY): Payer: Medicare Other

## 2014-07-03 ENCOUNTER — Ambulatory Visit: Admit: 2014-07-03 | Payer: Self-pay | Admitting: Surgery

## 2014-07-03 SURGERY — HEMORRHOIDECTOMY
Anesthesia: General

## 2014-07-22 DIAGNOSIS — Z3042 Encounter for surveillance of injectable contraceptive: Secondary | ICD-10-CM | POA: Diagnosis not present

## 2014-08-01 ENCOUNTER — Other Ambulatory Visit: Payer: Self-pay | Admitting: Obstetrics and Gynecology

## 2014-08-01 DIAGNOSIS — N941 Dyspareunia: Secondary | ICD-10-CM | POA: Diagnosis not present

## 2014-08-01 DIAGNOSIS — N898 Other specified noninflammatory disorders of vagina: Secondary | ICD-10-CM | POA: Diagnosis not present

## 2014-08-01 DIAGNOSIS — Z3202 Encounter for pregnancy test, result negative: Secondary | ICD-10-CM | POA: Diagnosis not present

## 2014-08-28 DIAGNOSIS — F4001 Agoraphobia with panic disorder: Secondary | ICD-10-CM | POA: Diagnosis not present

## 2014-08-28 DIAGNOSIS — F39 Unspecified mood [affective] disorder: Secondary | ICD-10-CM | POA: Diagnosis not present

## 2014-08-28 DIAGNOSIS — F0632 Mood disorder due to known physiological condition with major depressive-like episode: Secondary | ICD-10-CM | POA: Diagnosis not present

## 2014-08-28 DIAGNOSIS — F42 Obsessive-compulsive disorder: Secondary | ICD-10-CM | POA: Diagnosis not present

## 2014-09-15 ENCOUNTER — Emergency Department (INDEPENDENT_AMBULATORY_CARE_PROVIDER_SITE_OTHER)
Admission: EM | Admit: 2014-09-15 | Discharge: 2014-09-15 | Disposition: A | Discharge status: Home / Self Care | Payer: Medicare Other | Source: Home / Self Care | Attending: Family Medicine | Admitting: Family Medicine

## 2014-09-15 ENCOUNTER — Encounter (HOSPITAL_COMMUNITY): Payer: Self-pay | Admitting: Family Medicine

## 2014-09-15 DIAGNOSIS — R0781 Pleurodynia: Secondary | ICD-10-CM | POA: Diagnosis not present

## 2014-09-15 DIAGNOSIS — M62830 Muscle spasm of back: Secondary | ICD-10-CM | POA: Diagnosis not present

## 2014-09-15 MED ORDER — IBUPROFEN 800 MG PO TABS: 800.0000 mg | ORAL_TABLET | Freq: Three times a day (TID) | ORAL | Status: DC | PRN

## 2014-09-15 MED ORDER — METHOCARBAMOL 500 MG PO TABS: 500.0000 mg | ORAL_TABLET | Freq: Four times a day (QID) | ORAL | Status: DC | PRN

## 2014-09-15 NOTE — ED Notes (Signed)
C/o chest pain radiating to neck and back which started today

## 2014-09-15 NOTE — Discharge Instructions (Signed)
You have likely developed a spasm of your back muscles.  This will take some time to resolve Please stay  active and let pain be her guide. Please use the Motrin 600 for the pain and inflammation. This is safe to breast-feed while taking. The Robaxin for additional muscle spasm and pain relief. Please remember to pump just before taking this medicine and then discard all breast milk for the next 8 hours. Posterior to the emergency room if your symptoms get worse.
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
## 2014-10-02 ENCOUNTER — Other Ambulatory Visit: Payer: Self-pay | Admitting: Obstetrics and Gynecology

## 2014-10-02 DIAGNOSIS — Z124 Encounter for screening for malignant neoplasm of cervix: Secondary | ICD-10-CM | POA: Diagnosis not present

## 2014-10-03 LAB — CYTOLOGY - PAP

## 2014-10-14 DIAGNOSIS — Z3042 Encounter for surveillance of injectable contraceptive: Secondary | ICD-10-CM | POA: Diagnosis not present

## 2014-11-03 DIAGNOSIS — F39 Unspecified mood [affective] disorder: Secondary | ICD-10-CM | POA: Diagnosis not present

## 2014-11-03 DIAGNOSIS — F0632 Mood disorder due to known physiological condition with major depressive-like episode: Secondary | ICD-10-CM | POA: Diagnosis not present

## 2014-11-03 DIAGNOSIS — F4001 Agoraphobia with panic disorder: Secondary | ICD-10-CM | POA: Diagnosis not present

## 2014-11-11 ENCOUNTER — Emergency Department (HOSPITAL_COMMUNITY)
Admission: EM | Admit: 2014-11-11 | Discharge: 2014-11-11 | Disposition: A | Discharge status: Home / Self Care | Payer: Medicare Other | Attending: Physician Assistant | Admitting: Physician Assistant

## 2014-11-11 ENCOUNTER — Encounter (HOSPITAL_COMMUNITY): Payer: Self-pay | Admitting: Physical Medicine and Rehabilitation

## 2014-11-11 ENCOUNTER — Emergency Department (EMERGENCY_DEPARTMENT_HOSPITAL)
Admit: 2014-11-11 | Discharge: 2014-11-11 | Disposition: A | Discharge status: Home / Self Care | Payer: Medicare Other | Attending: Emergency Medicine | Admitting: Emergency Medicine

## 2014-11-11 DIAGNOSIS — M79609 Pain in unspecified limb: Secondary | ICD-10-CM

## 2014-11-11 DIAGNOSIS — J45909 Unspecified asthma, uncomplicated: Secondary | ICD-10-CM | POA: Insufficient documentation

## 2014-11-11 DIAGNOSIS — Z793 Long term (current) use of hormonal contraceptives: Secondary | ICD-10-CM | POA: Diagnosis not present

## 2014-11-11 DIAGNOSIS — M79621 Pain in right upper arm: Secondary | ICD-10-CM | POA: Diagnosis present

## 2014-11-11 DIAGNOSIS — Z880 Allergy status to penicillin: Secondary | ICD-10-CM | POA: Insufficient documentation

## 2014-11-11 DIAGNOSIS — I809 Phlebitis and thrombophlebitis of unspecified site: Secondary | ICD-10-CM

## 2014-11-11 DIAGNOSIS — Z87891 Personal history of nicotine dependence: Secondary | ICD-10-CM | POA: Diagnosis not present

## 2014-11-11 DIAGNOSIS — I808 Phlebitis and thrombophlebitis of other sites: Secondary | ICD-10-CM | POA: Insufficient documentation

## 2014-11-11 DIAGNOSIS — I10 Essential (primary) hypertension: Secondary | ICD-10-CM | POA: Diagnosis not present

## 2014-11-11 MED ORDER — ACETAMINOPHEN 325 MG PO TABS
650.0000 mg | ORAL_TABLET | Freq: Once | ORAL | Status: AC
  Administered 2014-11-11: 650 mg via ORAL
  Filled 2014-11-11: qty 2

## 2014-11-11 NOTE — Discharge Instructions (Signed)
Phlebitis (superficial) You can use Aspirin 325 mg daily to treat your phlebitis and/or ibuprofen.  Phlebitis is soreness and swelling (inflammation) of a vein. This can occur in your arms, legs, or torso (trunk), as well as deeper inside your body. Phlebitis is usually not serious when it occurs close to the surface of the body. However, it can cause serious problems when it occurs in a vein deeper inside the body. CAUSES  Phlebitis can be triggered by various things, including:   Reduced blood flow through your veins. This can happen with:  Bed rest over a long period.  Long-distance travel.  Injury.  Surgery.  Being overweight (obese) or pregnant.  Having an IV tube put in the vein and getting certain medicines through the vein.  Cancer and cancer treatment.  Use of illegal drugs taken through the vein.  Inflammatory diseases.  Inherited (genetic) diseases that increase the risk of blood clots.  Hormone therapy, such as birth control pills. SIGNS AND SYMPTOMS   Red, tender, swollen, and painful area on your skin. Usually, the area will be long and narrow.  Firmness along the center of the affected area. This can indicate that a blood clot has formed.  Low-grade fever. DIAGNOSIS  A health care provider can usually diagnose phlebitis by examining the affected area and asking about your symptoms. To check for infection or blood clots, your health care provider may order blood tests or an ultrasound exam of the area. Blood tests and your family history may also indicate if you have an underlying genetic disease that causes blood clots. Occasionally, a piece of tissue is taken from the body (biopsy sample) if an unusual cause of phlebitis is suspected. TREATMENT  Treatment will vary depending on the severity of the condition and the area of the body affected. Treatment may include:  Use of a warm compress or heating pad.  Use of compression stockings or  bandages.  Anti-inflammatory medicines.  Removal of any IV tube that may be causing the problem.  Medicines that kill germs (antibiotics) if an infection is present.  Blood-thinning medicines if a blood clot is suspected or present.  In rare cases, surgery may be needed to remove damaged sections of vein. HOME CARE INSTRUCTIONS   Only take over-the-counter or prescription medicines as directed by your health care provider. Take all medicines exactly as prescribed.  Raise (elevate) the affected area above the level of your heart as directed by your health care provider.  Apply a warm compress or heating pad to the affected area as directed by your health care provider. Do not sleep with the heating pad.  Use compression stockings or bandages as directed. These will speed healing and prevent the condition from coming back.  If you are on blood thinners:  Get follow-up blood tests as directed by your health care provider.  Check with your health care provider before using any new medicines.  Carry a medical alert card or wear your medical alert jewelry to show that you are on blood thinners.  For phlebitis in the legs:  Avoid prolonged standing or bed rest.  Keep your legs moving. Raise your legs when sitting or lying.  Do not smoke.  Women, particularly those over the age of 51, should consider the risks and benefits of taking the contraceptive pill. This kind of hormone treatment can increase your risk for blood clots.  Follow up with your health care provider as directed. SEEK MEDICAL CARE IF:   You have  unusual bruising or any bleeding problems.  Your swelling or pain in the affected area is not improving.  You are on anti-inflammatory medicine, and you develop belly (abdominal) pain. SEEK IMMEDIATE MEDICAL CARE IF:   You have a sudden onset of chest pain or difficulty breathing.  You have a fever or persistent symptoms for more than 2-3 days.  You have a fever  and your symptoms suddenly get worse. MAKE SURE YOU:  Understand these instructions.  Will watch your condition.  Will get help right away if you are not doing well or get worse.   This information is not intended to replace advice given to you by your health care provider. Make sure you discuss any questions you have with your health care provider.   Document Released: 12/28/2000 Document Revised: 10/24/2012 Document Reviewed: 09/10/2012 Elsevier Interactive Patient Education [employer].
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
## 2014-11-11 NOTE — Progress Notes (Signed)
*  PRELIMINARY RESULTS* Vascular Ultrasound Right upper extremity venous duplex has been completed.  Preliminary findings: Negative for DVT. Superficial thrombosis is noted in a superficial vein of the right hand.  Landry Mellow, RDMS, RVT  11/11/2014, 3:56 PM

## 2014-11-11 NOTE — ED Notes (Addendum)
Pt states she recently had wisdom teeth removed x2 weeks ago, now reports pain to R wrist/arm where peripheral IV was in place from surgery. No redness noted. No swelling noted. Able to wiggle all digits, pulses intact to R arm. NAD

## 2015-01-06 DIAGNOSIS — Z3042 Encounter for surveillance of injectable contraceptive: Secondary | ICD-10-CM | POA: Diagnosis not present

## 2015-02-05 DIAGNOSIS — F0632 Mood disorder due to known physiological condition with major depressive-like episode: Secondary | ICD-10-CM | POA: Diagnosis not present

## 2015-02-05 DIAGNOSIS — F39 Unspecified mood [affective] disorder: Secondary | ICD-10-CM | POA: Diagnosis not present

## 2015-03-25 DIAGNOSIS — Z3042 Encounter for surveillance of injectable contraceptive: Secondary | ICD-10-CM | POA: Diagnosis not present

## 2015-05-10 ENCOUNTER — Emergency Department (HOSPITAL_COMMUNITY): Payer: Medicare Other

## 2015-05-10 ENCOUNTER — Emergency Department (HOSPITAL_COMMUNITY)
Admission: EM | Admit: 2015-05-10 | Discharge: 2015-05-10 | Disposition: A | Discharge status: Home / Self Care | Payer: Medicare Other | Attending: Emergency Medicine | Admitting: Emergency Medicine

## 2015-05-10 ENCOUNTER — Encounter (HOSPITAL_COMMUNITY): Payer: Self-pay | Admitting: Emergency Medicine

## 2015-05-10 DIAGNOSIS — F3170 Bipolar disorder, currently in remission, most recent episode unspecified: Secondary | ICD-10-CM | POA: Insufficient documentation

## 2015-05-10 DIAGNOSIS — R600 Localized edema: Secondary | ICD-10-CM | POA: Diagnosis not present

## 2015-05-10 DIAGNOSIS — Z87891 Personal history of nicotine dependence: Secondary | ICD-10-CM | POA: Insufficient documentation

## 2015-05-10 DIAGNOSIS — I10 Essential (primary) hypertension: Secondary | ICD-10-CM | POA: Insufficient documentation

## 2015-05-10 DIAGNOSIS — Y939 Activity, unspecified: Secondary | ICD-10-CM | POA: Insufficient documentation

## 2015-05-10 DIAGNOSIS — Y9289 Other specified places as the place of occurrence of the external cause: Secondary | ICD-10-CM | POA: Insufficient documentation

## 2015-05-10 DIAGNOSIS — X58XXXA Exposure to other specified factors, initial encounter: Secondary | ICD-10-CM | POA: Insufficient documentation

## 2015-05-10 DIAGNOSIS — J45909 Unspecified asthma, uncomplicated: Secondary | ICD-10-CM | POA: Insufficient documentation

## 2015-05-10 DIAGNOSIS — S93402A Sprain of unspecified ligament of left ankle, initial encounter: Secondary | ICD-10-CM | POA: Diagnosis not present

## 2015-05-10 DIAGNOSIS — M25572 Pain in left ankle and joints of left foot: Secondary | ICD-10-CM | POA: Diagnosis not present

## 2015-05-10 DIAGNOSIS — Y999 Unspecified external cause status: Secondary | ICD-10-CM | POA: Insufficient documentation

## 2015-05-10 DIAGNOSIS — S99912A Unspecified injury of left ankle, initial encounter: Secondary | ICD-10-CM | POA: Diagnosis present

## 2015-05-10 MED ORDER — IBUPROFEN 800 MG PO TABS
800.0000 mg | ORAL_TABLET | Freq: Once | ORAL | Status: AC
  Administered 2015-05-10: 800 mg via ORAL
  Filled 2015-05-10: qty 1

## 2015-05-10 MED ORDER — NAPROXEN 500 MG PO TABS: 500.0000 mg | ORAL_TABLET | Freq: Two times a day (BID) | ORAL | Status: DC

## 2015-05-10 NOTE — ED Notes (Signed)
Pt with left ankle pain since being at a nightclub very early Saturday morning, questionable twisting with running inside that night, pt states she has taken ibuprofen and ASA

## 2015-05-10 NOTE — Discharge Instructions (Signed)
Naprosyn for pain as prescribed. Keep your leg elevated at home. Ice several times a day. Follow up with primary care doctor or orthopedics specialist if not improving.    Ankle Sprain An ankle sprain is an injury to the strong, fibrous tissues (ligaments) that hold the bones of your ankle joint together.  CAUSES An ankle sprain is usually caused by a fall or by twisting your ankle. Ankle sprains most commonly occur when you step on the outer edge of your foot, and your ankle turns inward. People who participate in sports are more prone to these types of injuries.  SYMPTOMS   Pain in your ankle. The pain may be present at rest or only when you are trying to stand or walk.  Swelling.  Bruising. Bruising may develop immediately or within 1 to 2 days after your injury.  Difficulty standing or walking, particularly when turning corners or changing directions. DIAGNOSIS  Your caregiver will ask you details about your injury and perform a physical exam of your ankle to determine if you have an ankle sprain. During the physical exam, your caregiver will press on and apply pressure to specific areas of your foot and ankle. Your caregiver will try to move your ankle in certain ways. An X-ray exam may be done to be sure a bone was not broken or a ligament did not separate from one of the bones in your ankle (avulsion fracture).  TREATMENT  Certain types of braces can help stabilize your ankle. Your caregiver can make a recommendation for this. Your caregiver may recommend the use of medicine for pain. If your sprain is severe, your caregiver may refer you to a surgeon who helps to restore function to parts of your skeletal system (orthopedist) or a physical therapist. [place] ice to your injury for 1-2 days or as directed by your caregiver. Applying ice helps to reduce inflammation and pain.  Put ice in a plastic bag.  Place a towel between your skin and the bag.  Leave the  ice on for 15-20 minutes at a time, every 2 hours while you are awake.  Only take over-the-counter or prescription medicines for pain, discomfort, or fever as directed by your caregiver.  Elevate your injured ankle above the level of your heart as much as possible for 2-3 days.  If your caregiver recommends crutches, use them as instructed. Gradually put weight on the affected ankle. Continue to use crutches or a cane until you can walk without feeling pain in your ankle.  If you have a plaster splint, wear the splint as directed by your caregiver. Do not rest it on anything harder than a pillow for the first 24 hours. Do not put weight on it. Do not get it wet. You may take it off to take a shower or bath.  You may have been given an elastic bandage to wear around your ankle to provide support. If the elastic bandage is too tight (you have numbness or tingling in your foot or your foot becomes cold and blue), adjust the bandage to make it comfortable.  If you have an air splint, you may blow more air into it or let air out to make it more comfortable. You may take your splint off at night and before taking a shower or bath. Wiggle your toes in the splint several times per day to decrease swelling. SEEK MEDICAL CARE IF:   You have rapidly increasing bruising or swelling.  Your  toes feel extremely cold or you lose feeling in your foot.  Your pain is not relieved with medicine. SEEK IMMEDIATE MEDICAL CARE IF:  Your toes are numb or blue.  You have severe pain that is increasing. MAKE SURE YOU:   Understand these instructions.  Will watch your condition.  Will get help right away if you are not doing well or get worse.   This information is not intended to replace advice given to you by your health care provider. Make sure you discuss any questions you have with your health care provider.   Document Released: 01/03/2005 Document Revised: 01/24/2014 Document Reviewed:  01/15/2011 Elsevier Interactive Patient Education [employer].
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
## 2015-05-10 NOTE — ED Notes (Signed)
Pt reports LT ankle injury that occurred on Friday. Reports limited ROM and difficulty ambulating. Moderate swelling noted.

## 2015-06-17 DIAGNOSIS — Z3042 Encounter for surveillance of injectable contraceptive: Secondary | ICD-10-CM | POA: Diagnosis not present

## 2015-07-02 ENCOUNTER — Encounter: Payer: Self-pay | Admitting: *Deleted

## 2015-07-02 ENCOUNTER — Emergency Department (INDEPENDENT_AMBULATORY_CARE_PROVIDER_SITE_OTHER): Payer: Medicare Other

## 2015-07-02 ENCOUNTER — Emergency Department (INDEPENDENT_AMBULATORY_CARE_PROVIDER_SITE_OTHER)
Admission: EM | Admit: 2015-07-02 | Discharge: 2015-07-02 | Disposition: A | Discharge status: Home / Self Care | Payer: Medicare Other | Source: Home / Self Care | Attending: Family Medicine | Admitting: Family Medicine

## 2015-07-02 DIAGNOSIS — J069 Acute upper respiratory infection, unspecified: Secondary | ICD-10-CM

## 2015-07-02 DIAGNOSIS — R0602 Shortness of breath: Secondary | ICD-10-CM | POA: Diagnosis not present

## 2015-07-02 DIAGNOSIS — M940 Chondrocostal junction syndrome [Tietze]: Secondary | ICD-10-CM | POA: Diagnosis not present

## 2015-07-02 DIAGNOSIS — R05 Cough: Secondary | ICD-10-CM | POA: Diagnosis not present

## 2015-07-02 DIAGNOSIS — B9789 Other viral agents as the cause of diseases classified elsewhere: Principal | ICD-10-CM

## 2015-07-02 DIAGNOSIS — J4520 Mild intermittent asthma, uncomplicated: Secondary | ICD-10-CM

## 2015-07-02 MED ORDER — AZITHROMYCIN 250 MG PO TABS: ORAL_TABLET | ORAL | Status: DC

## 2015-07-02 MED ORDER — ALBUTEROL SULFATE HFA 108 (90 BASE) MCG/ACT IN AERS: 2.0000 | INHALATION_SPRAY | RESPIRATORY_TRACT | Status: DC | PRN

## 2015-07-02 MED ORDER — PREDNISONE 20 MG PO TABS: ORAL_TABLET | ORAL | Status: DC

## 2015-07-02 MED ORDER — BENZONATATE 200 MG PO CAPS: 200.0000 mg | ORAL_CAPSULE | Freq: Every day | ORAL | Status: DC

## 2015-07-02 NOTE — Discharge Instructions (Signed)
Take plain guaifenesin (1200mg  extended release tabs such as Mucinex) twice daily, with plenty of water, for cough and congestion.  May add Pseudoephedrine (30mg , one or two every 4 to 6 hours) for sinus congestion.  Get adequate rest.   May use Afrin nasal spray (or generic oxymetazoline) twice daily for about 5 days and then discontinue.  Also recommend using saline nasal spray several times daily and saline nasal irrigation (AYR is a common brand).  Use Flonase nasal spray each morning after using Afrin nasal spray and saline nasal irrigation. Try warm salt water gargles for sore throat.  Stop all antihistamines for now, and other non-prescription cough/cold preparations.  Begin Azithromycin if not improving about one week or if persistent fever develops   Follow-up with family doctor if not improving about10 days.  If symptoms become significantly worse during the night or over the weekend, proceed to the local emergency room.    Asthma, Adult Asthma is a recurring condition in which the airways tighten and narrow. Asthma can make it difficult to breathe. It can cause coughing, wheezing, and shortness of breath. Asthma episodes, also called asthma attacks, range from minor to life-threatening. Asthma cannot be cured, but medicines and lifestyle changes can help control it. CAUSES Asthma is believed to be caused by inherited (genetic) and environmental factors, but its exact cause is unknown. Asthma may be triggered by allergens, lung infections, or irritants in the air. Asthma triggers are different for each person. Common triggers include:   Animal dander.  Dust mites.  Cockroaches.  Pollen from trees or grass.  Mold.  Smoke.  Air pollutants such as dust, household cleaners, hair sprays, aerosol sprays, paint fumes, strong chemicals, or strong odors.  Cold air, weather changes, and winds (which increase molds and pollens in the air).  Strong emotional expressions such as crying or  laughing hard.  Stress.  Certain medicines (such as aspirin) or types of drugs (such as beta-blockers).  Sulfites in foods and drinks. Foods and drinks that may contain sulfites include dried fruit, potato chips, and sparkling grape juice.  Infections or inflammatory conditions such as the flu, a cold, or an inflammation of the nasal membranes (rhinitis).  Gastroesophageal reflux disease (GERD).  Exercise or strenuous activity. SYMPTOMS Symptoms may occur immediately after asthma is triggered or many hours later. Symptoms include:  Wheezing.  Excessive nighttime or early morning coughing.  Frequent or severe coughing with a common cold.  Chest tightness.  Shortness of breath. DIAGNOSIS  The diagnosis of asthma is made by a review of your medical history and a physical exam. Tests may also be performed. These may include:  Lung function studies. These tests show how much air you breathe in and out.  Allergy tests.  Imaging tests such as X-rays. TREATMENT  Asthma cannot be cured, but it can usually be controlled. Treatment involves identifying and avoiding your asthma triggers. It also involves medicines. There are 2 classes of medicine used for asthma treatment:   Controller medicines. These prevent asthma symptoms from occurring. They are usually taken every day.  Reliever or rescue medicines. These quickly relieve asthma symptoms. They are used as needed and provide short-term relief. Your health care provider will help you create an asthma action plan. An asthma action plan is a written plan for managing and treating your asthma attacks. It includes a list of your asthma triggers and how they may be avoided. It also includes information on when medicines should be taken and when their  dosage should be changed. An action plan may also involve the use of a device called a peak flow meter. A peak flow meter measures how well the lungs are working. It helps you monitor your  condition. HOME CARE INSTRUCTIONS   Take medicines only as directed by your health care provider. Speak with your health care provider if you have questions about how or when to take the medicines.  Use a peak flow meter as directed by your health care provider. Record and keep track of readings.  Understand and use the action plan to help minimize or stop an asthma attack without needing to seek medical care.  Control your home environment in the following ways to help prevent asthma attacks:  Do not smoke. Avoid being exposed to secondhand smoke.  Change your heating and air conditioning filter regularly.  Limit your use of fireplaces and wood stoves.  Get rid of pests (such as roaches and mice) and their droppings.  Throw away plants if you see mold on them.  Clean your floors and dust regularly. Use unscented cleaning products.  Try to have someone else vacuum for you regularly. Stay out of rooms while they are being vacuumed and for a short while afterward. If you vacuum, use a dust mask from a hardware store, a double-layered or microfilter vacuum cleaner bag, or a vacuum cleaner with a HEPA filter.  Replace carpet with wood, tile, or vinyl flooring. Carpet can trap dander and dust.  Use allergy-proof pillows, mattress covers, and box spring covers.  Wash bed sheets and blankets every week in hot water and dry them in a dryer.  Use blankets that are made of polyester or cotton.  Clean bathrooms and kitchens with bleach. If possible, have someone repaint the walls in these rooms with mold-resistant paint. Keep out of the rooms that are being cleaned and painted.  Wash hands frequently. SEEK MEDICAL CARE IF:   You have wheezing, shortness of breath, or a cough even if taking medicine to prevent attacks.  The colored mucus you cough up (sputum) is thicker than usual.  Your sputum changes from clear or white to yellow, green, gray, or bloody.  You have any problems that  may be related to the medicines you are taking (such as a rash, itching, swelling, or trouble breathing).  You are using a reliever medicine more than 2-3 times per week.  Your peak flow is still at 50-79% of your personal best after following your action plan for 1 hour.  You have a fever. SEEK IMMEDIATE MEDICAL CARE IF:   You seem to be getting worse and are unresponsive to treatment during an asthma attack.  You are short of breath even at rest.  You get short of breath when doing very little physical activity.  You have difficulty eating, drinking, or talking due to asthma symptoms.  You develop chest pain.  You develop a fast heartbeat.  You have a bluish color to your lips or fingernails.  You are light-headed, dizzy, or faint.  Your peak flow is less than 50% of your personal best.   This information is not intended to replace advice given to you by your health care provider. Make sure you discuss any questions you have with your health care provider.   Document Released: 01/03/2005 Document Revised: 09/24/2014 Document Reviewed: 08/02/2012 Elsevier Interactive Patient Education 2016 Else[employer]    Costochondritis Costochondritis, sometimes called Tietze syndrome, is a swelling and irritation (inflammation) of the tissue (cartilage)  that connects your ribs with your breastbone (sternum). It causes pain in the chest and rib area. Costochondritis usually goes away on its own over time. It can take up to 6 weeks or longer to get better, especially if you are unable to limit your activities. CAUSES  Some cases of costochondritis have no known cause. Possible causes include:  Injury (trauma).  Exercise or activity such as lifting.  Severe coughing. SIGNS AND SYMPTOMS  Pain and tenderness in the chest and rib area.  Pain that gets worse when coughing or taking deep breaths.  Pain that gets worse with specific movements. DIAGNOSIS  Your health care provider will  do a physical exam and ask about your symptoms. Chest X-rays or other tests may be done to rule out other problems. TREATMENT  Costochondritis usually goes away on its own over time. Your health care provider may prescribe medicine to help relieve pain. HOME CARE INSTRUCTIONS   Avoid exhausting physical activity. Try not to strain your ribs during normal activity. This would include any activities using chest, abdominal, and side muscles, especially if heavy weights are used.  Apply ice to the affected area for the first 2 days after the pain begins.  Put ice in a plastic bag.  Place a towel between your skin and the bag.  Leave the ice on for 20 minutes, 2-3 times a day.  Only take over-the-counter or prescription medicines as directed by your health care provider. SEEK MEDICAL CARE IF:  You have redness or swelling at the rib joints. These are signs of infection.  Your pain does not go away despite rest or medicine. SEEK IMMEDIATE MEDICAL CARE IF:   Your pain increases or you are very uncomfortable.  You have shortness of breath or difficulty breathing.  You cough up blood.  You have worse chest pains, sweating, or vomiting.  You have a fever or persistent symptoms for more than 2-3 days.  You have a fever and your symptoms suddenly get worse. MAKE SURE YOU:   Understand these instructions.  Will watch your condition.  Will get help right away if you are not doing well or get worse.   This information is not intended to replace advice given to you by your health care provider. Make sure you discuss any questions you have with your health care provider.   Document Released: 10/13/2004 Document Revised: 10/24/2012 Document Reviewed: 08/07/2012 Elsevier Interactive Patient Education [employer].
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
## 2015-07-02 NOTE — ED Notes (Signed)
Pt c/o 1 week of cough, congestion, HA, back soreness. Cough is rarely productive. Taken delsym and tylenol without much relief. Fever only on day 1.

## 2015-08-10 DIAGNOSIS — F0632 Mood disorder due to known physiological condition with major depressive-like episode: Secondary | ICD-10-CM | POA: Diagnosis not present

## 2015-08-12 ENCOUNTER — Encounter (HOSPITAL_COMMUNITY): Payer: Self-pay | Admitting: Family Medicine

## 2015-08-12 ENCOUNTER — Ambulatory Visit (HOSPITAL_COMMUNITY)
Admission: EM | Admit: 2015-08-12 | Discharge: 2015-08-12 | Disposition: A | Discharge status: Home / Self Care | Payer: Medicare Other | Attending: Family Medicine | Admitting: Family Medicine

## 2015-08-12 DIAGNOSIS — R05 Cough: Secondary | ICD-10-CM

## 2015-08-12 DIAGNOSIS — R0789 Other chest pain: Secondary | ICD-10-CM

## 2015-08-12 DIAGNOSIS — R059 Cough, unspecified: Secondary | ICD-10-CM

## 2015-08-12 HISTORY — DX: Pneumonia, unspecified organism: J18.9
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
## 2015-08-12 NOTE — Discharge Instructions (Signed)
No more smoking, drink plenty of water.

## 2015-08-12 NOTE — ED Triage Notes (Signed)
C/O 2 months of hoarse voice and cough.  Was told "virus" 1 mo ago &given inhaler; completed course of prednisone.  Has been having back pain "like when I had pneumonia", along with productive cough with green blood-tinged sputum.  Since yesterday has been having SOB & chest pain.  Pain worse with deep breathing and laying down.  Denies fevers.  Started again with nasal congestion yesterday.  Also noted tender, swollen area near left axilla since yesterday.

## 2015-08-21 DIAGNOSIS — F4001 Agoraphobia with panic disorder: Secondary | ICD-10-CM | POA: Diagnosis not present

## 2015-08-21 DIAGNOSIS — F39 Unspecified mood [affective] disorder: Secondary | ICD-10-CM | POA: Diagnosis not present

## 2015-09-03 DIAGNOSIS — F0632 Mood disorder due to known physiological condition with major depressive-like episode: Secondary | ICD-10-CM | POA: Diagnosis not present

## 2015-09-09 DIAGNOSIS — Z3042 Encounter for surveillance of injectable contraceptive: Secondary | ICD-10-CM | POA: Diagnosis not present

## 2015-09-15 DIAGNOSIS — F39 Unspecified mood [affective] disorder: Secondary | ICD-10-CM | POA: Diagnosis not present

## 2015-10-14 DIAGNOSIS — H10413 Chronic giant papillary conjunctivitis, bilateral: Secondary | ICD-10-CM | POA: Diagnosis not present

## 2015-10-21 DIAGNOSIS — R030 Elevated blood-pressure reading, without diagnosis of hypertension: Secondary | ICD-10-CM | POA: Diagnosis not present

## 2015-10-21 DIAGNOSIS — Z113 Encounter for screening for infections with a predominantly sexual mode of transmission: Secondary | ICD-10-CM | POA: Diagnosis not present

## 2015-10-21 DIAGNOSIS — F988 Other specified behavioral and emotional disorders with onset usually occurring in childhood and adolescence: Secondary | ICD-10-CM | POA: Diagnosis not present

## 2015-10-21 DIAGNOSIS — R002 Palpitations: Secondary | ICD-10-CM | POA: Diagnosis not present

## 2015-10-21 DIAGNOSIS — R490 Dysphonia: Secondary | ICD-10-CM | POA: Diagnosis not present

## 2015-10-21 DIAGNOSIS — Z79899 Other long term (current) drug therapy: Secondary | ICD-10-CM | POA: Diagnosis not present

## 2015-10-21 DIAGNOSIS — R9431 Abnormal electrocardiogram [ECG] [EKG]: Secondary | ICD-10-CM | POA: Diagnosis not present

## 2015-10-21 DIAGNOSIS — L989 Disorder of the skin and subcutaneous tissue, unspecified: Secondary | ICD-10-CM | POA: Diagnosis not present

## 2015-10-21 DIAGNOSIS — F419 Anxiety disorder, unspecified: Secondary | ICD-10-CM | POA: Diagnosis not present

## 2015-10-26 DIAGNOSIS — Z113 Encounter for screening for infections with a predominantly sexual mode of transmission: Secondary | ICD-10-CM | POA: Diagnosis not present

## 2015-10-26 DIAGNOSIS — R030 Elevated blood-pressure reading, without diagnosis of hypertension: Secondary | ICD-10-CM | POA: Diagnosis not present

## 2015-10-26 DIAGNOSIS — Z79899 Other long term (current) drug therapy: Secondary | ICD-10-CM | POA: Diagnosis not present

## 2015-10-26 DIAGNOSIS — R3129 Other microscopic hematuria: Secondary | ICD-10-CM | POA: Diagnosis not present

## 2015-11-05 DIAGNOSIS — F4001 Agoraphobia with panic disorder: Secondary | ICD-10-CM | POA: Diagnosis not present

## 2015-11-05 DIAGNOSIS — F39 Unspecified mood [affective] disorder: Secondary | ICD-10-CM | POA: Diagnosis not present

## 2015-11-13 DIAGNOSIS — J310 Chronic rhinitis: Secondary | ICD-10-CM | POA: Diagnosis not present

## 2015-11-13 DIAGNOSIS — F17200 Nicotine dependence, unspecified, uncomplicated: Secondary | ICD-10-CM | POA: Diagnosis not present

## 2015-11-13 DIAGNOSIS — J383 Other diseases of vocal cords: Secondary | ICD-10-CM | POA: Diagnosis not present

## 2015-11-13 DIAGNOSIS — R490 Dysphonia: Secondary | ICD-10-CM | POA: Diagnosis not present

## 2015-11-18 DIAGNOSIS — F4001 Agoraphobia with panic disorder: Secondary | ICD-10-CM | POA: Diagnosis not present

## 2015-11-18 DIAGNOSIS — F39 Unspecified mood [affective] disorder: Secondary | ICD-10-CM | POA: Diagnosis not present

## 2015-11-21 ENCOUNTER — Emergency Department (HOSPITAL_COMMUNITY)
Admission: EM | Admit: 2015-11-21 | Discharge: 2015-11-21 | Disposition: A | Discharge status: Home / Self Care | Payer: Medicare Other | Attending: Emergency Medicine | Admitting: Emergency Medicine

## 2015-11-21 ENCOUNTER — Emergency Department (HOSPITAL_COMMUNITY): Payer: Medicare Other

## 2015-11-21 ENCOUNTER — Encounter (HOSPITAL_COMMUNITY): Payer: Self-pay | Admitting: Emergency Medicine

## 2015-11-21 DIAGNOSIS — Y929 Unspecified place or not applicable: Secondary | ICD-10-CM | POA: Diagnosis not present

## 2015-11-21 DIAGNOSIS — X500XXA Overexertion from strenuous movement or load, initial encounter: Secondary | ICD-10-CM | POA: Insufficient documentation

## 2015-11-21 DIAGNOSIS — Y999 Unspecified external cause status: Secondary | ICD-10-CM | POA: Diagnosis not present

## 2015-11-21 DIAGNOSIS — F17210 Nicotine dependence, cigarettes, uncomplicated: Secondary | ICD-10-CM | POA: Insufficient documentation

## 2015-11-21 DIAGNOSIS — Y9389 Activity, other specified: Secondary | ICD-10-CM | POA: Insufficient documentation

## 2015-11-21 DIAGNOSIS — J45909 Unspecified asthma, uncomplicated: Secondary | ICD-10-CM | POA: Insufficient documentation

## 2015-11-21 DIAGNOSIS — Z79899 Other long term (current) drug therapy: Secondary | ICD-10-CM | POA: Diagnosis not present

## 2015-11-21 DIAGNOSIS — S63641A Sprain of metacarpophalangeal joint of right thumb, initial encounter: Secondary | ICD-10-CM | POA: Insufficient documentation

## 2015-11-21 DIAGNOSIS — I10 Essential (primary) hypertension: Secondary | ICD-10-CM | POA: Insufficient documentation

## 2015-11-21 DIAGNOSIS — S6991XA Unspecified injury of right wrist, hand and finger(s), initial encounter: Secondary | ICD-10-CM | POA: Diagnosis not present

## 2015-11-21 MED ORDER — HYDROCODONE-ACETAMINOPHEN 5-325 MG PO TABS
1.0000 | ORAL_TABLET | Freq: Once | ORAL | Status: AC
Start: 2015-11-21 — End: 2015-11-21
  Administered 2015-11-21: 1 via ORAL
  Filled 2015-11-21: qty 1

## 2015-11-21 MED ORDER — HYDROCODONE-ACETAMINOPHEN 5-325 MG PO TABS: ORAL_TABLET | ORAL | 0 refills | Status: DC

## 2015-11-21 MED ORDER — IBUPROFEN 800 MG PO TABS
800.0000 mg | ORAL_TABLET | Freq: Once | ORAL | Status: AC
  Administered 2015-11-21: 800 mg via ORAL
  Filled 2015-11-21: qty 1

## 2015-11-21 MED ORDER — IBUPROFEN 800 MG PO TABS: 800.0000 mg | ORAL_TABLET | Freq: Three times a day (TID) | ORAL | 0 refills | Status: DC

## 2015-11-21 NOTE — ED Triage Notes (Signed)
Pt reports she tried to catch a stroller when it tipped over, bent her R thumb backwards. Pt states she "popped her thumb back in to place." Pt states she thought it would slowly get better but the thumb is hurting worse and she has limited ROM.

## 2015-11-21 NOTE — Discharge Instructions (Signed)
Apply ice packs on/off.  Keep the thumb splinted.  Call the hand surgeon's office on Monday to arrange a follow-up appt
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
## 2015-11-25 DIAGNOSIS — S63641A Sprain of metacarpophalangeal joint of right thumb, initial encounter: Secondary | ICD-10-CM | POA: Diagnosis not present

## 2015-11-26 DIAGNOSIS — I10 Essential (primary) hypertension: Secondary | ICD-10-CM | POA: Diagnosis not present

## 2015-11-26 DIAGNOSIS — Z720 Tobacco use: Secondary | ICD-10-CM | POA: Diagnosis not present

## 2015-11-30 DIAGNOSIS — I10 Essential (primary) hypertension: Secondary | ICD-10-CM | POA: Diagnosis not present

## 2015-11-30 DIAGNOSIS — R002 Palpitations: Secondary | ICD-10-CM | POA: Diagnosis not present

## 2015-12-02 DIAGNOSIS — Z3042 Encounter for surveillance of injectable contraceptive: Secondary | ICD-10-CM | POA: Diagnosis not present

## 2015-12-28 DIAGNOSIS — I10 Essential (primary) hypertension: Secondary | ICD-10-CM | POA: Diagnosis not present

## 2015-12-30 DIAGNOSIS — B9689 Other specified bacterial agents as the cause of diseases classified elsewhere: Secondary | ICD-10-CM | POA: Diagnosis not present

## 2015-12-30 DIAGNOSIS — I10 Essential (primary) hypertension: Secondary | ICD-10-CM | POA: Diagnosis not present

## 2015-12-30 DIAGNOSIS — J329 Chronic sinusitis, unspecified: Secondary | ICD-10-CM | POA: Diagnosis not present

## 2016-01-27 DIAGNOSIS — Z720 Tobacco use: Secondary | ICD-10-CM | POA: Diagnosis not present

## 2016-01-27 DIAGNOSIS — J383 Other diseases of vocal cords: Secondary | ICD-10-CM | POA: Diagnosis not present

## 2016-01-27 DIAGNOSIS — R1310 Dysphagia, unspecified: Secondary | ICD-10-CM | POA: Diagnosis not present

## 2016-02-09 DIAGNOSIS — L988 Other specified disorders of the skin and subcutaneous tissue: Secondary | ICD-10-CM | POA: Diagnosis not present

## 2016-02-10 DIAGNOSIS — Z6835 Body mass index (BMI) 35.0-35.9, adult: Secondary | ICD-10-CM | POA: Diagnosis not present

## 2016-02-10 DIAGNOSIS — I10 Essential (primary) hypertension: Secondary | ICD-10-CM | POA: Diagnosis not present

## 2016-02-24 DIAGNOSIS — Z3042 Encounter for surveillance of injectable contraceptive: Secondary | ICD-10-CM | POA: Diagnosis not present

## 2016-03-08 DIAGNOSIS — I10 Essential (primary) hypertension: Secondary | ICD-10-CM | POA: Diagnosis not present

## 2016-03-08 DIAGNOSIS — R0609 Other forms of dyspnea: Secondary | ICD-10-CM | POA: Diagnosis not present

## 2016-03-08 DIAGNOSIS — R079 Chest pain, unspecified: Secondary | ICD-10-CM | POA: Diagnosis not present

## 2016-03-08 DIAGNOSIS — R000 Tachycardia, unspecified: Secondary | ICD-10-CM | POA: Diagnosis not present

## 2016-03-14 DIAGNOSIS — I10 Essential (primary) hypertension: Secondary | ICD-10-CM | POA: Diagnosis not present

## 2016-03-14 DIAGNOSIS — E669 Obesity, unspecified: Secondary | ICD-10-CM | POA: Diagnosis not present

## 2016-03-16 ENCOUNTER — Other Ambulatory Visit: Payer: Self-pay | Admitting: Obstetrics and Gynecology

## 2016-03-16 DIAGNOSIS — Z124 Encounter for screening for malignant neoplasm of cervix: Secondary | ICD-10-CM | POA: Diagnosis not present

## 2016-03-16 DIAGNOSIS — Z6834 Body mass index (BMI) 34.0-34.9, adult: Secondary | ICD-10-CM | POA: Diagnosis not present

## 2016-03-16 DIAGNOSIS — Z3040 Encounter for surveillance of contraceptives, unspecified: Secondary | ICD-10-CM | POA: Diagnosis not present

## 2016-03-18 LAB — CYTOLOGY - PAP

## 2016-05-09 DIAGNOSIS — J382 Nodules of vocal cords: Secondary | ICD-10-CM | POA: Diagnosis not present

## 2016-05-09 DIAGNOSIS — J343 Hypertrophy of nasal turbinates: Secondary | ICD-10-CM | POA: Diagnosis not present

## 2016-05-09 DIAGNOSIS — J342 Deviated nasal septum: Secondary | ICD-10-CM | POA: Diagnosis not present

## 2016-05-09 DIAGNOSIS — J3489 Other specified disorders of nose and nasal sinuses: Secondary | ICD-10-CM | POA: Diagnosis not present

## 2016-05-14 ENCOUNTER — Encounter (HOSPITAL_COMMUNITY): Payer: Self-pay | Admitting: *Deleted

## 2016-05-14 ENCOUNTER — Emergency Department (HOSPITAL_COMMUNITY)
Admission: EM | Admit: 2016-05-14 | Discharge: 2016-05-14 | Disposition: A | Discharge status: Home / Self Care | Payer: Medicare Other | Attending: Emergency Medicine | Admitting: Emergency Medicine

## 2016-05-14 DIAGNOSIS — H109 Unspecified conjunctivitis: Secondary | ICD-10-CM | POA: Insufficient documentation

## 2016-05-14 DIAGNOSIS — Z79899 Other long term (current) drug therapy: Secondary | ICD-10-CM | POA: Diagnosis not present

## 2016-05-14 DIAGNOSIS — I10 Essential (primary) hypertension: Secondary | ICD-10-CM | POA: Insufficient documentation

## 2016-05-14 DIAGNOSIS — J029 Acute pharyngitis, unspecified: Secondary | ICD-10-CM

## 2016-05-14 DIAGNOSIS — F17210 Nicotine dependence, cigarettes, uncomplicated: Secondary | ICD-10-CM | POA: Insufficient documentation

## 2016-05-14 DIAGNOSIS — H1033 Unspecified acute conjunctivitis, bilateral: Secondary | ICD-10-CM

## 2016-05-14 DIAGNOSIS — J45909 Unspecified asthma, uncomplicated: Secondary | ICD-10-CM | POA: Diagnosis not present

## 2016-05-14 LAB — COMPREHENSIVE METABOLIC PANEL
ALT: 14 U/L (ref 14–54)
ANION GAP: 9 (ref 5–15)
AST: 19 U/L (ref 15–41)
Albumin: 3.7 g/dL (ref 3.5–5.0)
Alkaline Phosphatase: 64 U/L (ref 38–126)
BUN: 11 mg/dL (ref 6–20)
CALCIUM: 9 mg/dL (ref 8.9–10.3)
CHLORIDE: 106 mmol/L (ref 101–111)
CO2: 25 mmol/L (ref 22–32)
CREATININE: 0.85 mg/dL (ref 0.44–1.00)
Glucose, Bld: 103 mg/dL — ABNORMAL HIGH (ref 65–99)
Potassium: 3.9 mmol/L (ref 3.5–5.1)
SODIUM: 140 mmol/L (ref 135–145)
Total Bilirubin: 0.4 mg/dL (ref 0.3–1.2)
Total Protein: 6.7 g/dL (ref 6.5–8.1)

## 2016-05-14 LAB — CBC WITH DIFFERENTIAL/PLATELET
Basophils Absolute: 0 10*3/uL (ref 0.0–0.1)
Basophils Relative: 0 %
EOS ABS: 0.4 10*3/uL (ref 0.0–0.7)
EOS PCT: 3 %
HCT: 40.3 % (ref 36.0–46.0)
Hemoglobin: 13.8 g/dL (ref 12.0–15.0)
LYMPHS ABS: 3.4 10*3/uL (ref 0.7–4.0)
LYMPHS PCT: 29 %
MCH: 31.7 pg (ref 26.0–34.0)
MCHC: 34.2 g/dL (ref 30.0–36.0)
MCV: 92.6 fL (ref 78.0–100.0)
MONO ABS: 0.7 10*3/uL (ref 0.1–1.0)
MONOS PCT: 6 %
Neutro Abs: 7.4 10*3/uL (ref 1.7–7.7)
Neutrophils Relative %: 62 %
Platelets: 322 10*3/uL (ref 150–400)
RBC: 4.35 MIL/uL (ref 3.87–5.11)
RDW: 12.5 % (ref 11.5–15.5)
WBC: 11.9 10*3/uL — AB (ref 4.0–10.5)

## 2016-05-14 LAB — RAPID STREP SCREEN (MED CTR MEBANE ONLY): STREPTOCOCCUS, GROUP A SCREEN (DIRECT): NEGATIVE

## 2016-05-14 MED ORDER — CETIRIZINE HCL 10 MG PO TABS: 10.0000 mg | ORAL_TABLET | Freq: Every day | ORAL | 0 refills | Status: DC

## 2016-05-14 MED ORDER — ACETAMINOPHEN 325 MG PO TABS
650.0000 mg | ORAL_TABLET | Freq: Once | ORAL | Status: AC
  Administered 2016-05-14: 650 mg via ORAL
  Filled 2016-05-14: qty 2

## 2016-05-14 MED ORDER — ERYTHROMYCIN 5 MG/GM OP OINT: TOPICAL_OINTMENT | OPHTHALMIC | 0 refills | Status: DC

## 2016-05-14 NOTE — Discharge Instructions (Signed)
As discussed, use the antihistamine to help with swelling. Erythromycin ointment for your eye infection. Use hot tea with honey, cold wet compresses on your eyes. Follow up with your primary care provider. Return to the ED if experiencing any worsening, visual disturbances or other new concerning symptoms.
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
## 2016-05-14 NOTE — ED Triage Notes (Signed)
Pt c/o bil neck pain onset x 5 days, pt reports worsening of sore throat, pt reports bil eye swelling & green eye drainage, pt c/o SOB & body aches & HA, pt A&O x4, denies v/d

## 2016-05-16 LAB — CULTURE, GROUP A STREP (THRC)

## 2016-05-19 DIAGNOSIS — J029 Acute pharyngitis, unspecified: Secondary | ICD-10-CM | POA: Diagnosis not present

## 2016-05-19 DIAGNOSIS — R05 Cough: Secondary | ICD-10-CM | POA: Diagnosis not present

## 2016-05-19 DIAGNOSIS — J40 Bronchitis, not specified as acute or chronic: Secondary | ICD-10-CM | POA: Diagnosis not present

## 2016-05-19 DIAGNOSIS — I10 Essential (primary) hypertension: Secondary | ICD-10-CM | POA: Diagnosis not present

## 2016-05-19 DIAGNOSIS — Z3042 Encounter for surveillance of injectable contraceptive: Secondary | ICD-10-CM | POA: Diagnosis not present

## 2016-06-15 DIAGNOSIS — F988 Other specified behavioral and emotional disorders with onset usually occurring in childhood and adolescence: Secondary | ICD-10-CM | POA: Diagnosis not present

## 2016-06-15 DIAGNOSIS — E669 Obesity, unspecified: Secondary | ICD-10-CM | POA: Diagnosis not present

## 2016-06-15 DIAGNOSIS — I10 Essential (primary) hypertension: Secondary | ICD-10-CM | POA: Diagnosis not present

## 2016-08-10 DIAGNOSIS — Z3202 Encounter for pregnancy test, result negative: Secondary | ICD-10-CM | POA: Diagnosis not present

## 2016-08-10 DIAGNOSIS — Z3042 Encounter for surveillance of injectable contraceptive: Secondary | ICD-10-CM | POA: Diagnosis not present

## 2016-08-31 DIAGNOSIS — F900 Attention-deficit hyperactivity disorder, predominantly inattentive type: Secondary | ICD-10-CM | POA: Diagnosis not present

## 2016-08-31 DIAGNOSIS — F39 Unspecified mood [affective] disorder: Secondary | ICD-10-CM | POA: Diagnosis not present

## 2016-10-31 DIAGNOSIS — F1011 Alcohol abuse, in remission: Secondary | ICD-10-CM | POA: Diagnosis not present

## 2016-10-31 DIAGNOSIS — F429 Obsessive-compulsive disorder, unspecified: Secondary | ICD-10-CM | POA: Diagnosis not present

## 2016-10-31 DIAGNOSIS — F39 Unspecified mood [affective] disorder: Secondary | ICD-10-CM | POA: Diagnosis not present

## 2016-10-31 DIAGNOSIS — F900 Attention-deficit hyperactivity disorder, predominantly inattentive type: Secondary | ICD-10-CM | POA: Diagnosis not present

## 2016-10-31 DIAGNOSIS — F4001 Agoraphobia with panic disorder: Secondary | ICD-10-CM | POA: Diagnosis not present

## 2016-11-02 DIAGNOSIS — Z3042 Encounter for surveillance of injectable contraceptive: Secondary | ICD-10-CM | POA: Diagnosis not present

## 2016-11-18 DIAGNOSIS — R112 Nausea with vomiting, unspecified: Secondary | ICD-10-CM | POA: Diagnosis not present

## 2016-11-18 DIAGNOSIS — K921 Melena: Secondary | ICD-10-CM | POA: Diagnosis not present

## 2016-11-18 DIAGNOSIS — I10 Essential (primary) hypertension: Secondary | ICD-10-CM | POA: Diagnosis not present

## 2016-11-18 DIAGNOSIS — Z9119 Patient's noncompliance with other medical treatment and regimen: Secondary | ICD-10-CM | POA: Diagnosis not present

## 2016-11-18 DIAGNOSIS — N926 Irregular menstruation, unspecified: Secondary | ICD-10-CM | POA: Diagnosis not present

## 2016-11-18 DIAGNOSIS — Z23 Encounter for immunization: Secondary | ICD-10-CM | POA: Diagnosis not present

## 2016-11-22 DIAGNOSIS — F39 Unspecified mood [affective] disorder: Secondary | ICD-10-CM | POA: Diagnosis not present

## 2016-11-22 DIAGNOSIS — F1011 Alcohol abuse, in remission: Secondary | ICD-10-CM | POA: Diagnosis not present

## 2016-11-23 DIAGNOSIS — R1084 Generalized abdominal pain: Secondary | ICD-10-CM | POA: Diagnosis not present

## 2016-11-23 DIAGNOSIS — K5900 Constipation, unspecified: Secondary | ICD-10-CM | POA: Diagnosis not present

## 2016-11-23 DIAGNOSIS — K625 Hemorrhage of anus and rectum: Secondary | ICD-10-CM | POA: Diagnosis not present

## 2016-11-23 DIAGNOSIS — K219 Gastro-esophageal reflux disease without esophagitis: Secondary | ICD-10-CM | POA: Diagnosis not present

## 2016-11-30 DIAGNOSIS — K210 Gastro-esophageal reflux disease with esophagitis: Secondary | ICD-10-CM | POA: Diagnosis not present

## 2016-11-30 DIAGNOSIS — D123 Benign neoplasm of transverse colon: Secondary | ICD-10-CM | POA: Diagnosis not present

## 2016-11-30 DIAGNOSIS — R1310 Dysphagia, unspecified: Secondary | ICD-10-CM | POA: Diagnosis not present

## 2016-11-30 DIAGNOSIS — K2950 Unspecified chronic gastritis without bleeding: Secondary | ICD-10-CM | POA: Diagnosis not present

## 2016-11-30 DIAGNOSIS — K228 Other specified diseases of esophagus: Secondary | ICD-10-CM | POA: Diagnosis not present

## 2016-11-30 DIAGNOSIS — K3189 Other diseases of stomach and duodenum: Secondary | ICD-10-CM | POA: Diagnosis not present

## 2016-11-30 DIAGNOSIS — K648 Other hemorrhoids: Secondary | ICD-10-CM | POA: Diagnosis not present

## 2016-11-30 DIAGNOSIS — R12 Heartburn: Secondary | ICD-10-CM | POA: Diagnosis not present

## 2016-11-30 DIAGNOSIS — K625 Hemorrhage of anus and rectum: Secondary | ICD-10-CM | POA: Diagnosis not present

## 2016-12-02 DIAGNOSIS — N926 Irregular menstruation, unspecified: Secondary | ICD-10-CM | POA: Diagnosis not present

## 2016-12-02 DIAGNOSIS — I10 Essential (primary) hypertension: Secondary | ICD-10-CM | POA: Diagnosis not present

## 2016-12-16 DIAGNOSIS — F39 Unspecified mood [affective] disorder: Secondary | ICD-10-CM | POA: Diagnosis not present

## 2016-12-16 DIAGNOSIS — F1011 Alcohol abuse, in remission: Secondary | ICD-10-CM | POA: Diagnosis not present

## 2016-12-31 DIAGNOSIS — F4001 Agoraphobia with panic disorder: Secondary | ICD-10-CM | POA: Diagnosis not present

## 2016-12-31 DIAGNOSIS — F3162 Bipolar disorder, current episode mixed, moderate: Secondary | ICD-10-CM | POA: Diagnosis not present

## 2017-01-06 DIAGNOSIS — F3162 Bipolar disorder, current episode mixed, moderate: Secondary | ICD-10-CM | POA: Diagnosis not present

## 2017-01-06 DIAGNOSIS — F1011 Alcohol abuse, in remission: Secondary | ICD-10-CM | POA: Diagnosis not present

## 2017-09-14 ENCOUNTER — Emergency Department (HOSPITAL_COMMUNITY): Payer: Medicare Other

## 2017-09-14 ENCOUNTER — Other Ambulatory Visit: Payer: Self-pay

## 2017-09-14 ENCOUNTER — Emergency Department (HOSPITAL_COMMUNITY)
Admission: EM | Admit: 2017-09-14 | Discharge: 2017-09-14 | Disposition: A | Discharge status: Home / Self Care | Payer: Medicare Other | Attending: Emergency Medicine | Admitting: Emergency Medicine

## 2017-09-14 ENCOUNTER — Encounter (HOSPITAL_COMMUNITY): Payer: Self-pay

## 2017-09-14 DIAGNOSIS — Z79899 Other long term (current) drug therapy: Secondary | ICD-10-CM | POA: Diagnosis not present

## 2017-09-14 DIAGNOSIS — F17210 Nicotine dependence, cigarettes, uncomplicated: Secondary | ICD-10-CM | POA: Insufficient documentation

## 2017-09-14 DIAGNOSIS — J45909 Unspecified asthma, uncomplicated: Secondary | ICD-10-CM | POA: Insufficient documentation

## 2017-09-14 DIAGNOSIS — I10 Essential (primary) hypertension: Secondary | ICD-10-CM | POA: Insufficient documentation

## 2017-09-14 DIAGNOSIS — G43909 Migraine, unspecified, not intractable, without status migrainosus: Secondary | ICD-10-CM | POA: Insufficient documentation

## 2017-09-14 DIAGNOSIS — R51 Headache: Secondary | ICD-10-CM | POA: Diagnosis present

## 2017-09-14 LAB — URINALYSIS, ROUTINE W REFLEX MICROSCOPIC
BILIRUBIN URINE: NEGATIVE
Glucose, UA: NEGATIVE mg/dL
Hgb urine dipstick: NEGATIVE
Ketones, ur: 5 mg/dL — AB
Leukocytes, UA: NEGATIVE
NITRITE: NEGATIVE
PROTEIN: NEGATIVE mg/dL
SPECIFIC GRAVITY, URINE: 1.021 (ref 1.005–1.030)
pH: 5 (ref 5.0–8.0)

## 2017-09-14 LAB — CBC WITH DIFFERENTIAL/PLATELET
Basophils Absolute: 0 10*3/uL (ref 0.0–0.1)
Basophils Relative: 0 %
EOS PCT: 1 %
Eosinophils Absolute: 0.1 10*3/uL (ref 0.0–0.7)
HCT: 43.9 % (ref 36.0–46.0)
Hemoglobin: 15.5 g/dL — ABNORMAL HIGH (ref 12.0–15.0)
LYMPHS PCT: 34 %
Lymphs Abs: 3.2 10*3/uL (ref 0.7–4.0)
MCH: 32.6 pg (ref 26.0–34.0)
MCHC: 35.3 g/dL (ref 30.0–36.0)
MCV: 92.2 fL (ref 78.0–100.0)
MONO ABS: 0.4 10*3/uL (ref 0.1–1.0)
Monocytes Relative: 5 %
NEUTROS PCT: 60 %
Neutro Abs: 5.6 10*3/uL (ref 1.7–7.7)
PLATELETS: 324 10*3/uL (ref 150–400)
RBC: 4.76 MIL/uL (ref 3.87–5.11)
RDW: 12.6 % (ref 11.5–15.5)
WBC: 9.3 10*3/uL (ref 4.0–10.5)

## 2017-09-14 LAB — COMPREHENSIVE METABOLIC PANEL
ALT: 19 U/L (ref 0–44)
AST: 17 U/L (ref 15–41)
Albumin: 5 g/dL (ref 3.5–5.0)
Alkaline Phosphatase: 66 U/L (ref 38–126)
Anion gap: 9 (ref 5–15)
BUN: 11 mg/dL (ref 6–20)
CHLORIDE: 107 mmol/L (ref 98–111)
CO2: 26 mmol/L (ref 22–32)
CREATININE: 0.67 mg/dL (ref 0.44–1.00)
Calcium: 9.5 mg/dL (ref 8.9–10.3)
Glucose, Bld: 83 mg/dL (ref 70–99)
POTASSIUM: 3.7 mmol/L (ref 3.5–5.1)
Sodium: 142 mmol/L (ref 135–145)
TOTAL PROTEIN: 7.5 g/dL (ref 6.5–8.1)
Total Bilirubin: 0.6 mg/dL (ref 0.3–1.2)

## 2017-09-14 LAB — PREGNANCY, URINE: PREG TEST UR: NEGATIVE

## 2017-09-14 MED ORDER — ACETAMINOPHEN 325 MG PO TABS
650.0000 mg | ORAL_TABLET | Freq: Once | ORAL | Status: AC
  Administered 2017-09-14: 650 mg via ORAL
  Filled 2017-09-14: qty 2

## 2017-09-14 MED ORDER — ACETAMINOPHEN 325 MG PO TABS: 650.0000 mg | ORAL_TABLET | Freq: Once | ORAL | Status: DC

## 2017-09-14 NOTE — ED Triage Notes (Signed)
Patient reports that she was at her PCP today. patient was sent to the ED for left eye pupil >right pupil, dizziness, and not being able to get her thoughts out,but very talkative in triage. Patient also c/o headache and stumbling recently.
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
## 2017-09-14 NOTE — ED Notes (Signed)
Patient transported to MRI 

## 2018-06-12 ENCOUNTER — Encounter (HOSPITAL_COMMUNITY): Payer: Self-pay | Admitting: *Deleted

## 2018-06-12 ENCOUNTER — Emergency Department (HOSPITAL_COMMUNITY): Payer: Medicare Other

## 2018-06-12 ENCOUNTER — Other Ambulatory Visit: Payer: Self-pay

## 2018-06-12 ENCOUNTER — Inpatient Hospital Stay (HOSPITAL_COMMUNITY)
Admission: EM | Admit: 2018-06-12 | Discharge: 2018-06-15 | DRG: 392 | Disposition: A | Discharge status: Home / Self Care | Payer: Medicare Other | Attending: Family Medicine | Admitting: Family Medicine

## 2018-06-12 DIAGNOSIS — F17210 Nicotine dependence, cigarettes, uncomplicated: Secondary | ICD-10-CM | POA: Diagnosis present

## 2018-06-12 DIAGNOSIS — J45909 Unspecified asthma, uncomplicated: Secondary | ICD-10-CM | POA: Diagnosis present

## 2018-06-12 DIAGNOSIS — F429 Obsessive-compulsive disorder, unspecified: Secondary | ICD-10-CM | POA: Diagnosis present

## 2018-06-12 DIAGNOSIS — F419 Anxiety disorder, unspecified: Secondary | ICD-10-CM | POA: Diagnosis present

## 2018-06-12 DIAGNOSIS — Z818 Family history of other mental and behavioral disorders: Secondary | ICD-10-CM

## 2018-06-12 DIAGNOSIS — N83209 Unspecified ovarian cyst, unspecified side: Secondary | ICD-10-CM | POA: Diagnosis present

## 2018-06-12 DIAGNOSIS — R112 Nausea with vomiting, unspecified: Secondary | ICD-10-CM

## 2018-06-12 DIAGNOSIS — K529 Noninfective gastroenteritis and colitis, unspecified: Secondary | ICD-10-CM | POA: Diagnosis not present

## 2018-06-12 DIAGNOSIS — R197 Diarrhea, unspecified: Secondary | ICD-10-CM

## 2018-06-12 DIAGNOSIS — Z1159 Encounter for screening for other viral diseases: Secondary | ICD-10-CM

## 2018-06-12 DIAGNOSIS — R1031 Right lower quadrant pain: Secondary | ICD-10-CM | POA: Diagnosis present

## 2018-06-12 DIAGNOSIS — Z880 Allergy status to penicillin: Secondary | ICD-10-CM

## 2018-06-12 DIAGNOSIS — R109 Unspecified abdominal pain: Secondary | ICD-10-CM | POA: Diagnosis not present

## 2018-06-12 DIAGNOSIS — Z79899 Other long term (current) drug therapy: Secondary | ICD-10-CM

## 2018-06-12 DIAGNOSIS — I10 Essential (primary) hypertension: Secondary | ICD-10-CM | POA: Diagnosis present

## 2018-06-12 DIAGNOSIS — F3170 Bipolar disorder, currently in remission, most recent episode unspecified: Secondary | ICD-10-CM | POA: Diagnosis present

## 2018-06-12 DIAGNOSIS — N83201 Unspecified ovarian cyst, right side: Secondary | ICD-10-CM | POA: Diagnosis present

## 2018-06-12 DIAGNOSIS — Z8261 Family history of arthritis: Secondary | ICD-10-CM

## 2018-06-12 DIAGNOSIS — Z7951 Long term (current) use of inhaled steroids: Secondary | ICD-10-CM

## 2018-06-12 DIAGNOSIS — Z833 Family history of diabetes mellitus: Secondary | ICD-10-CM

## 2018-06-12 DIAGNOSIS — K5669 Other partial intestinal obstruction: Secondary | ICD-10-CM | POA: Diagnosis present

## 2018-06-12 DIAGNOSIS — K5289 Other specified noninfective gastroenteritis and colitis: Secondary | ICD-10-CM | POA: Diagnosis not present

## 2018-06-12 DIAGNOSIS — Z8249 Family history of ischemic heart disease and other diseases of the circulatory system: Secondary | ICD-10-CM

## 2018-06-12 DIAGNOSIS — K56609 Unspecified intestinal obstruction, unspecified as to partial versus complete obstruction: Secondary | ICD-10-CM

## 2018-06-12 LAB — URINALYSIS, ROUTINE W REFLEX MICROSCOPIC
Bilirubin Urine: NEGATIVE
Glucose, UA: NEGATIVE mg/dL
Hgb urine dipstick: NEGATIVE
Ketones, ur: 5 mg/dL — AB
Nitrite: NEGATIVE
Protein, ur: 30 mg/dL — AB
Specific Gravity, Urine: 1.03 (ref 1.005–1.030)
WBC, UA: >50 WBC/hpf — ABNORMAL HIGH (ref 0–5)
pH: 5 (ref 5.0–8.0)

## 2018-06-12 LAB — COMPREHENSIVE METABOLIC PANEL
ALT: 15 U/L (ref 0–44)
AST: 10 U/L — ABNORMAL LOW (ref 15–41)
Albumin: 3.9 g/dL (ref 3.5–5.0)
Alkaline Phosphatase: 61 U/L (ref 38–126)
Anion gap: 11 (ref 5–15)
BUN: 8 mg/dL (ref 6–20)
CO2: 26 mmol/L (ref 22–32)
Calcium: 9.4 mg/dL (ref 8.9–10.3)
Chloride: 103 mmol/L (ref 98–111)
Creatinine, Ser: 0.69 mg/dL (ref 0.44–1.00)
GFR calc Af Amer: >60 mL/min (ref >60)
GFR calc non Af Amer: >60 mL/min (ref >60)
Glucose, Bld: 98 mg/dL (ref 70–99)
Potassium: 4.2 mmol/L (ref 3.5–5.1)
Sodium: 140 mmol/L (ref 135–145)
Total Bilirubin: 0.4 mg/dL (ref 0.3–1.2)
Total Protein: 7.7 g/dL (ref 6.5–8.1)

## 2018-06-12 LAB — CBC
HCT: 46.9 % — ABNORMAL HIGH (ref 36.0–46.0)
Hemoglobin: 15.4 g/dL — ABNORMAL HIGH (ref 12.0–15.0)
MCH: 31.1 pg (ref 26.0–34.0)
MCHC: 32.8 g/dL (ref 30.0–36.0)
MCV: 94.7 fL (ref 80.0–100.0)
Platelets: 451 10*3/uL — ABNORMAL HIGH (ref 150–400)
RBC: 4.95 MIL/uL (ref 3.87–5.11)
RDW: 11.9 % (ref 11.5–15.5)
WBC: 14.5 10*3/uL — ABNORMAL HIGH (ref 4.0–10.5)
nRBC: 0 % (ref 0.0–0.2)

## 2018-06-12 LAB — SARS CORONAVIRUS 2 BY RT PCR (HOSPITAL ORDER, PERFORMED IN ~~LOC~~ HOSPITAL LAB): SARS Coronavirus 2: NEGATIVE

## 2018-06-12 LAB — LIPASE, BLOOD: Lipase: 29 U/L (ref 11–51)

## 2018-06-12 LAB — POC URINE PREG, ED: Preg Test, Ur: NEGATIVE

## 2018-06-12 MED ORDER — DEXTROSE-NACL 5-0.9 % IV SOLN
INTRAVENOUS | Status: DC
  Administered 2018-06-12 – 2018-06-13 (×2): via INTRAVENOUS

## 2018-06-12 MED ORDER — MORPHINE SULFATE (PF) 4 MG/ML IV SOLN
4.0000 mg | INTRAVENOUS | Status: DC | PRN
  Administered 2018-06-12: 4 mg via INTRAVENOUS
  Filled 2018-06-12: qty 1

## 2018-06-12 MED ORDER — SODIUM CHLORIDE 0.9 % IV BOLUS
500.0000 mL | Freq: Once | INTRAVENOUS | Status: AC
  Administered 2018-06-12: 500 mL via INTRAVENOUS

## 2018-06-12 MED ORDER — CIPROFLOXACIN IN D5W 400 MG/200ML IV SOLN
400.0000 mg | Freq: Two times a day (BID) | INTRAVENOUS | Status: DC
  Administered 2018-06-12 – 2018-06-15 (×6): 400 mg via INTRAVENOUS
  Filled 2018-06-12 (×6): qty 200

## 2018-06-12 MED ORDER — METRONIDAZOLE IN NACL 5-0.79 MG/ML-% IV SOLN
500.0000 mg | Freq: Three times a day (TID) | INTRAVENOUS | Status: DC
  Administered 2018-06-12 – 2018-06-15 (×9): 500 mg via INTRAVENOUS
  Filled 2018-06-12 (×9): qty 100

## 2018-06-12 MED ORDER — ACETAMINOPHEN 650 MG RE SUPP: 650.0000 mg | Freq: Four times a day (QID) | RECTAL | Status: DC | PRN

## 2018-06-12 MED ORDER — POLYETHYLENE GLYCOL 3350 17 G PO PACK: 17.0000 g | PACK | Freq: Every day | ORAL | Status: DC | PRN

## 2018-06-12 MED ORDER — ALBUTEROL SULFATE (2.5 MG/3ML) 0.083% IN NEBU: 2.5000 mg | INHALATION_SOLUTION | RESPIRATORY_TRACT | Status: DC | PRN

## 2018-06-12 MED ORDER — MORPHINE SULFATE (PF) 4 MG/ML IV SOLN
4.0000 mg | Freq: Once | INTRAVENOUS | Status: AC
  Administered 2018-06-12: 4 mg via INTRAVENOUS
  Filled 2018-06-12: qty 1

## 2018-06-12 MED ORDER — ONDANSETRON HCL 4 MG/2ML IJ SOLN
4.0000 mg | Freq: Four times a day (QID) | INTRAMUSCULAR | Status: DC | PRN
  Administered 2018-06-13 – 2018-06-15 (×5): 4 mg via INTRAVENOUS
  Filled 2018-06-12 (×5): qty 2

## 2018-06-12 MED ORDER — ONDANSETRON HCL 4 MG PO TABS: 4.0000 mg | ORAL_TABLET | Freq: Four times a day (QID) | ORAL | Status: DC | PRN

## 2018-06-12 MED ORDER — HYDROMORPHONE HCL 1 MG/ML IJ SOLN
1.0000 mg | INTRAMUSCULAR | Status: DC | PRN
  Administered 2018-06-12 – 2018-06-14 (×7): 1 mg via INTRAVENOUS
  Filled 2018-06-12 (×7): qty 1

## 2018-06-12 MED ORDER — LORAZEPAM 2 MG/ML IJ SOLN: 0.5000 mg | Freq: Three times a day (TID) | INTRAMUSCULAR | Status: DC | PRN

## 2018-06-12 MED ORDER — ACETAMINOPHEN 325 MG PO TABS
650.0000 mg | ORAL_TABLET | Freq: Four times a day (QID) | ORAL | Status: DC | PRN
  Administered 2018-06-14: 650 mg via ORAL
  Filled 2018-06-12: qty 2

## 2018-06-12 MED ORDER — ONDANSETRON HCL 4 MG/2ML IJ SOLN
4.0000 mg | Freq: Once | INTRAMUSCULAR | Status: AC
  Administered 2018-06-12: 4 mg via INTRAVENOUS
  Filled 2018-06-12: qty 2

## 2018-06-12 MED ORDER — IOHEXOL 300 MG/ML  SOLN
100.0000 mL | Freq: Once | INTRAMUSCULAR | Status: AC | PRN
  Administered 2018-06-12: 100 mL via INTRAVENOUS
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
## 2018-06-12 NOTE — ED Triage Notes (Signed)
Pt c/o lower abdominal pain that started last night and this morning it started moving up the right side of her abdomen. Pt also c/o nausea and diarrhea. Denies vomiting.
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
## 2018-06-12 NOTE — Care Management Obs Status (Signed)
[name] NOTIFICATION   Patient Details  Name: [name] MRN: [id number] Date of Birth: 05/26/1985   Medicare Observation Status Notification Given:  Yes    Lennox Dolberry Dimitri Ped, LCSW 06/12/2018, 7:59 PM

## 2018-06-13 DIAGNOSIS — N83201 Unspecified ovarian cyst, right side: Secondary | ICD-10-CM | POA: Diagnosis present

## 2018-06-13 DIAGNOSIS — R1031 Right lower quadrant pain: Secondary | ICD-10-CM | POA: Diagnosis not present

## 2018-06-13 DIAGNOSIS — I10 Essential (primary) hypertension: Secondary | ICD-10-CM | POA: Diagnosis present

## 2018-06-13 DIAGNOSIS — D72829 Elevated white blood cell count, unspecified: Secondary | ICD-10-CM | POA: Diagnosis not present

## 2018-06-13 DIAGNOSIS — K56609 Unspecified intestinal obstruction, unspecified as to partial versus complete obstruction: Secondary | ICD-10-CM | POA: Diagnosis not present

## 2018-06-13 DIAGNOSIS — K5669 Other partial intestinal obstruction: Secondary | ICD-10-CM | POA: Diagnosis present

## 2018-06-13 DIAGNOSIS — Z8249 Family history of ischemic heart disease and other diseases of the circulatory system: Secondary | ICD-10-CM | POA: Diagnosis not present

## 2018-06-13 DIAGNOSIS — F3170 Bipolar disorder, currently in remission, most recent episode unspecified: Secondary | ICD-10-CM | POA: Diagnosis present

## 2018-06-13 DIAGNOSIS — F429 Obsessive-compulsive disorder, unspecified: Secondary | ICD-10-CM | POA: Diagnosis present

## 2018-06-13 DIAGNOSIS — R109 Unspecified abdominal pain: Secondary | ICD-10-CM | POA: Diagnosis present

## 2018-06-13 DIAGNOSIS — Z1159 Encounter for screening for other viral diseases: Secondary | ICD-10-CM | POA: Diagnosis not present

## 2018-06-13 DIAGNOSIS — Z8261 Family history of arthritis: Secondary | ICD-10-CM | POA: Diagnosis not present

## 2018-06-13 DIAGNOSIS — Z7951 Long term (current) use of inhaled steroids: Secondary | ICD-10-CM | POA: Diagnosis not present

## 2018-06-13 DIAGNOSIS — K5289 Other specified noninfective gastroenteritis and colitis: Secondary | ICD-10-CM | POA: Diagnosis present

## 2018-06-13 DIAGNOSIS — J45909 Unspecified asthma, uncomplicated: Secondary | ICD-10-CM | POA: Diagnosis present

## 2018-06-13 DIAGNOSIS — Z833 Family history of diabetes mellitus: Secondary | ICD-10-CM | POA: Diagnosis not present

## 2018-06-13 DIAGNOSIS — N83291 Other ovarian cyst, right side: Secondary | ICD-10-CM | POA: Diagnosis not present

## 2018-06-13 DIAGNOSIS — R112 Nausea with vomiting, unspecified: Secondary | ICD-10-CM | POA: Diagnosis not present

## 2018-06-13 DIAGNOSIS — Z880 Allergy status to penicillin: Secondary | ICD-10-CM | POA: Diagnosis not present

## 2018-06-13 DIAGNOSIS — F17210 Nicotine dependence, cigarettes, uncomplicated: Secondary | ICD-10-CM | POA: Diagnosis present

## 2018-06-13 DIAGNOSIS — K529 Noninfective gastroenteritis and colitis, unspecified: Secondary | ICD-10-CM | POA: Diagnosis not present

## 2018-06-13 DIAGNOSIS — Z818 Family history of other mental and behavioral disorders: Secondary | ICD-10-CM | POA: Diagnosis not present

## 2018-06-13 DIAGNOSIS — Z79899 Other long term (current) drug therapy: Secondary | ICD-10-CM | POA: Diagnosis not present

## 2018-06-13 DIAGNOSIS — F419 Anxiety disorder, unspecified: Secondary | ICD-10-CM | POA: Diagnosis present

## 2018-06-13 LAB — URINALYSIS, ROUTINE W REFLEX MICROSCOPIC
Bilirubin Urine: NEGATIVE
Glucose, UA: NEGATIVE mg/dL
Hgb urine dipstick: NEGATIVE
Ketones, ur: 20 mg/dL — AB
Nitrite: NEGATIVE
Protein, ur: NEGATIVE mg/dL
Specific Gravity, Urine: >1.046 — ABNORMAL HIGH (ref 1.005–1.030)
WBC, UA: >50 WBC/hpf — ABNORMAL HIGH (ref 0–5)
pH: 5 (ref 5.0–8.0)

## 2018-06-13 LAB — C DIFFICILE QUICK SCREEN W PCR REFLEX
C Diff antigen: NEGATIVE
C Diff interpretation: NOT DETECTED
C Diff toxin: NEGATIVE

## 2018-06-13 LAB — CBC
HCT: 43.8 % (ref 36.0–46.0)
Hemoglobin: 14.7 g/dL (ref 12.0–15.0)
MCH: 31.5 pg (ref 26.0–34.0)
MCHC: 33.6 g/dL (ref 30.0–36.0)
MCV: 93.8 fL (ref 80.0–100.0)
Platelets: 383 10*3/uL (ref 150–400)
RBC: 4.67 MIL/uL (ref 3.87–5.11)
RDW: 11.9 % (ref 11.5–15.5)
WBC: 14.4 10*3/uL — ABNORMAL HIGH (ref 4.0–10.5)
nRBC: 0 % (ref 0.0–0.2)

## 2018-06-13 LAB — BASIC METABOLIC PANEL
Anion gap: 11 (ref 5–15)
BUN: 6 mg/dL (ref 6–20)
CO2: 27 mmol/L (ref 22–32)
Calcium: 8.5 mg/dL — ABNORMAL LOW (ref 8.9–10.3)
Chloride: 102 mmol/L (ref 98–111)
Creatinine, Ser: 0.66 mg/dL (ref 0.44–1.00)
GFR calc Af Amer: >60 mL/min (ref >60)
GFR calc non Af Amer: >60 mL/min (ref >60)
Glucose, Bld: 122 mg/dL — ABNORMAL HIGH (ref 70–99)
Potassium: 3.4 mmol/L — ABNORMAL LOW (ref 3.5–5.1)
Sodium: 140 mmol/L (ref 135–145)

## 2018-06-13 MED ORDER — ALUM & MAG HYDROXIDE-SIMETH 200-200-20 MG/5ML PO SUSP
30.0000 mL | Freq: Once | ORAL | Status: AC
  Administered 2018-06-13: 30 mL
  Filled 2018-06-13: qty 30

## 2018-06-13 MED ORDER — FAMOTIDINE IN NACL 20-0.9 MG/50ML-% IV SOLN
20.0000 mg | Freq: Two times a day (BID) | INTRAVENOUS | Status: DC
  Administered 2018-06-13 – 2018-06-15 (×5): 20 mg via INTRAVENOUS
  Filled 2018-06-13 (×5): qty 50

## 2018-06-13 MED ORDER — SODIUM CHLORIDE 0.9 % IV SOLN
INTRAVENOUS | Status: DC | PRN
  Administered 2018-06-13: 17:00:00 via INTRAVENOUS

## 2018-06-13 MED ORDER — LIDOCAINE VISCOUS HCL 2 % MT SOLN
15.0000 mL | Freq: Once | OROMUCOSAL | Status: AC
  Administered 2018-06-13: 15 mL via ORAL
  Filled 2018-06-13: qty 15

## 2018-06-13 NOTE — Progress Notes (Signed)
PROGRESS NOTE    THERMA LASURE  IHK:[id number]  DOB: August 17, 1985  DOA: 06/12/2018 PCP: Jamesetta Geralds, MD   Brief Admission Hx: 33 year old female presented with lower abdominal pain nausea and vomiting and found to have enterocolitis and a ruptured hemorrhagic right ovarian cyst.  MDM/Assessment & Plan:   1. Enterocolitis-continue IV antibiotics IV fluids and supportive therapy.  Appreciate general surgery consultation.  IV pain management. 2. Bowel obstruction - continue NG for decompression, continue NPO status.  3. Ruptured hemorrhagic right ovarian cyst-likely the cause of her associated enterocolitis.  Treating supportively. 4. Leukocytosis- secondary to enterocolitis, follow CBC with differential. 5. Essential hypertension- temporarily holding home metoprolol. 6. Bipolar disorder - holding home meds while NPO.  7. Asthma - stable.   DVT prophylaxis: SCD Code Status: Full  Family Communication:  Disposition Plan: inpatient    Consultants:  surgery  Procedures:    Antimicrobials:  Cipro/flagyl 5/26 >   Subjective: Patient reports that she still has significant abdominal pain.  She denies emesis.  She still has some nausea.  Objective: Vitals:   06/12/18 1900 06/12/18 1930 06/12/18 2000 06/13/18 0543  BP: (!) 128/92 (!) 130/91 (!) 126/92 134/87  Pulse: 80 77 78   Resp:    20  Temp:    (!) 97.5 F (36.4 C)  TempSrc:    Oral  SpO2:      Weight:      Height:   5\' 8"  (1.727 m)     Intake/Output Summary (Last 24 hours) at 06/13/2018 1247 Last data filed at 06/13/2018 7564 Gross per 24 hour  Intake 859.9 ml  Output 550 ml  Net 309.9 ml   Filed Weights   06/12/18 1239  Weight: 81.6 kg   REVIEW OF SYSTEMS  As per history otherwise all reviewed and reported negative  Exam:  General exam: Awake, alert, no apparent distress, cooperative and pleasant. Respiratory system: Clear. No increased work of breathing. Cardiovascular system: S1 & S2  heard. No JVD, murmurs, gallops, clicks or pedal edema. Gastrointestinal system: Abdomen is nondistended, soft and pronounced left lower quadrant tenderness. Normal bowel sounds heard. Central nervous system: Alert and oriented. No focal neurological deficits. Extremities: no CCE.  Data Reviewed: Basic Metabolic Panel: Recent Labs  Lab 06/12/18 1316 06/13/18 0630  NA 140 140  K 4.2 3.4*  CL 103 102  CO2 26 27  GLUCOSE 98 122*  BUN 8 6  CREATININE 0.69 0.66  CALCIUM 9.4 8.5*   Liver Function Tests: Recent Labs  Lab 06/12/18 1316  AST 10*  ALT 15  ALKPHOS 61  BILITOT 0.4  PROT 7.7  ALBUMIN 3.9   Recent Labs  Lab 06/12/18 1316  LIPASE 29   No results for input(s): AMMONIA in the last 168 hours. CBC: Recent Labs  Lab 06/12/18 1316 06/13/18 0630  WBC 14.5* 14.4*  HGB 15.4* 14.7  HCT 46.9* 43.8  MCV 94.7 93.8  PLT 451* 383   Cardiac Enzymes: No results for input(s): CKTOTAL, CKMB, CKMBINDEX, TROPONINI in the last 168 hours. CBG (last 3)  No results for input(s): GLUCAP in the last 72 hours. Recent Results (from the past 240 hour(s))  SARS Coronavirus 2 (CEPHEID - Performed in [place] hospital lab), Hosp Order     Status: None   Collection Time: 06/12/18  6:50 PM  Result Value Ref Range Status   SARS Coronavirus 2 NEGATIVE NEGATIVE Final    Comment: (NOTE) If result is NEGATIVE SARS-CoV-2 target nucleic acids are NOT DETECTED.  The SARS-CoV-2 RNA is generally detectable in upper and lower  respiratory specimens during the acute phase of infection. The lowest  concentration of SARS-CoV-2 viral copies this assay can detect is 250  copies / mL. A negative result does not preclude SARS-CoV-2 infection  and should not be used as the sole basis for treatment or other  patient management decisions.  A negative result may occur with  improper specimen collection / handling, submission of specimen other  than nasopharyngeal swab, presence of viral mutation(s)  within the  areas targeted by this assay, and inadequate number of viral copies  (<250 copies / mL). A negative result must be combined with clinical  observations, patient history, and epidemiological information. If result is POSITIVE SARS-CoV-2 target nucleic acids are DETECTED. The SARS-CoV-2 RNA is generally detectable in upper and lower  respiratory specimens dur ing the acute phase of infection.  Positive  results are indicative of active infection with SARS-CoV-2.  Clinical  correlation with patient history and other diagnostic information is  necessary to determine patient infection status.  Positive results do  not rule out bacterial infection or co-infection with other viruses. If result is PRESUMPTIVE POSTIVE SARS-CoV-2 nucleic acids MAY BE PRESENT.   A presumptive positive result was obtained on the submitted specimen  and confirmed on repeat testing.  While 2019 novel coronavirus  (SARS-CoV-2) nucleic acids may be present in the submitted sample  additional confirmatory testing may be necessary for epidemiological  and / or clinical management purposes  to differentiate between  SARS-CoV-2 and other Sarbecovirus currently known to infect humans.  If clinically indicated additional testing with an alternate test  methodology [phone]) is advised. The SARS-CoV-2 RNA is generally  detectable in upper and lower respiratory sp ecimens during the acute  phase of infection. The expected result is Negative. Fact Sheet for Patients:  StrictlyIdeas.no Fact Sheet for Healthcare Providers: BankingDealers.co.za This test is not yet approved or cleared by the [place] FDA and has been authorized for detection and/or diagnosis of SARS-CoV-2 by FDA under an Emergency Use Authorization (EUA).  This EUA will remain in effect (meaning this test can be used) for the duration of the COVID-19 declaration under Section 564(b)(1) of the Act,  21 U.S.C. section 360bbb-3(b)(1), unless the authorization is terminated or revoked sooner. Performed at [facility], [address], [place], [place] [address]   C difficile quick scan w PCR reflex     Status: None   Collection Time: 06/13/18  6:20 AM  Result Value Ref Range Status   C Diff antigen NEGATIVE NEGATIVE Final   C Diff toxin NEGATIVE NEGATIVE Final   C Diff interpretation No C. difficile detected.  Final    Comment: Performed at [facility], [address] [place], [place] [address]     Studies: US Transvaginal Non-ob  Result Date: 06/12/2018 CLINICAL DATA:  Pelvic pain, nausea and diarrhea for 1 night. EXAM: TRANSABDOMINAL AND TRANSVAGINAL ULTRASOUND OF PELVIS DOPPLER ULTRASOUND OF OVARIES TECHNIQUE: Both transabdominal and transvaginal ultrasound examinations of the pelvis were performed. Transabdominal technique was performed for global imaging of the pelvis including uterus, ovaries, adnexal regions, and pelvic cul-de-sac. It was necessary to proceed with endovaginal exam following the transabdominal exam to visualize the adnexa. Color and duplex Doppler ultrasound was utilized to evaluate blood flow to the ovaries. COMPARISON:  CT abdomen and pelvis earlier today and CT chest, abdomen and pelvis 08/19/2012. FINDINGS: Uterus Measurements: 7.3 x 3.9 x 6.0 cm = volume: 89.6 mL.  No fibroids or other mass visualized. Endometrium Thickness: 0.7 cm.  No focal abnormality visualized. Right ovary Measurements: 6.7 x 5.0 x 3.7 cm = volume: 64.3 mL. Cystic lesion with a lacy internal echoes measuring 3.1 x 4.0 x 4.3 cm is seen within the ovary. Left ovary Not visualized. Pulsed Doppler evaluation of the right demonstrates normal low-resistance arterial and venous waveforms. Other findings Small to moderate volume of free pelvic fluid noted. IMPRESSION: Negative for ovarian torsion. Cystic lesion in the right ovary has an appearance most consistent with a hemorrhagic ovarian cyst. Small to  moderate volume of free pelvic fluid is likely related to small bowel obstruction seen on the patient's CT today. Electronically Signed   By: Inge Rise M.D.   On: 06/12/2018 17:25   US Pelvis Complete  Result Date: 06/12/2018 CLINICAL DATA:  Pelvic pain, nausea and diarrhea for 1 night. EXAM: TRANSABDOMINAL AND TRANSVAGINAL ULTRASOUND OF PELVIS DOPPLER ULTRASOUND OF OVARIES TECHNIQUE: Both transabdominal and transvaginal ultrasound examinations of the pelvis were performed. Transabdominal technique was performed for global imaging of the pelvis including uterus, ovaries, adnexal regions, and pelvic cul-de-sac. It was necessary to proceed with endovaginal exam following the transabdominal exam to visualize the adnexa. Color and duplex Doppler ultrasound was utilized to evaluate blood flow to the ovaries. COMPARISON:  CT abdomen and pelvis earlier today and CT chest, abdomen and pelvis 08/19/2012. FINDINGS: Uterus Measurements: 7.3 x 3.9 x 6.0 cm = volume: 89.6 mL. No fibroids or other mass visualized. Endometrium Thickness: 0.7 cm.  No focal abnormality visualized. Right ovary Measurements: 6.7 x 5.0 x 3.7 cm = volume: 64.3 mL. Cystic lesion with a lacy internal echoes measuring 3.1 x 4.0 x 4.3 cm is seen within the ovary. Left ovary Not visualized. Pulsed Doppler evaluation of the right demonstrates normal low-resistance arterial and venous waveforms. Other findings Small to moderate volume of free pelvic fluid noted. IMPRESSION: Negative for ovarian torsion. Cystic lesion in the right ovary has an appearance most consistent with a hemorrhagic ovarian cyst. Small to moderate volume of free pelvic fluid is likely related to small bowel obstruction seen on the patient's CT today. Electronically Signed   By: Inge Rise M.D.   On: 06/12/2018 17:25   Ct Abdomen Pelvis W Contrast  Result Date: 06/12/2018 CLINICAL DATA:  Lower abdominal pain with nausea diarrhea EXAM: CT ABDOMEN AND PELVIS WITH  CONTRAST TECHNIQUE: Multidetector CT imaging of the abdomen and pelvis was performed using the standard protocol following bolus administration of intravenous contrast. CONTRAST:  181mL OMNIPAQUE IOHEXOL 300 MG/ML  SOLN COMPARISON:  August 19, 2012 FINDINGS: Lower chest: Lung bases are clear. Hepatobiliary: No focal liver lesions are appreciable. The gallbladder wall is not appreciably thickened. There is no biliary duct dilatation. Pancreas: There is no appreciable pancreatic mass or inflammatory focus. Spleen: No splenic lesions are evident. A small accessory spleen is noted posterior to the spleen inferiorly. Adrenals/Urinary Tract: Adrenals bilaterally appear normal. There is a 5 mm probable cyst arising from the lower pole right kidney. A 6 mm apparent cyst arises inferiorly from the right kidney. There is no evident hydronephrosis on either side. There is no appreciable renal or ureteral calculus on either side. Urinary bladder is midline with wall thickness slightly increased. Stomach/Bowel: There is liquid stool throughout most of the colon. There is mild generalized small bowel dilatation with moderate fluid in the small bowel and overall mildly thickened small bowel loops. There is a transition zone near the junction the jejunum and  ileum, felt to represent a degree of small bowel obstruction. There is no free air or portal venous air. The terminal ileum region appears normal. Vascular/Lymphatic: There is no abdominal aortic aneurysm. No vascular lesions are evident. There is no adenopathy appreciable in the abdomen or pelvis. Reproductive: The uterus is mildly retroverted. There is a cystic structure arising from the right adnexa measuring 4.3 x 3.3 cm. There are smaller cystic areas, likely representing follicles in the right adnexa as well. There is enhancement along the superior aspect of the right adnexal region. There is ascitic fluid in the cul-de-sac region as well as anterior to the urinary  bladder. Other: Appendix appears within normal limits. No abscess is evident in the abdomen or pelvis. There are areas of ascites in the pelvis. Musculoskeletal: There is no blastic or lytic bone lesion. No intramuscular or abdominal wall lesion. IMPRESSION: 1. Cystic areas in an enlarged right ovary with enhancement along the periphery of this enlarged right ovary. This appearance raises concern for potential ovarian torsion with potential peripheral hyperemia. Pelvic ultrasound with Doppler assessment advised in this regard. 2. Small bowel obstruction with transition zone near the jejunoileal junction. There may be a degree of underlying colitis given the diffuse liquid stool throughout the colon. 3.  Moderate ascites which may be partially loculated in the pelvis. 4. Appendix felt to be within normal limits. No abscess in the abdomen or pelvis. Critical Value/emergent results were called by telephone at the time of interpretation on 06/12/2018 at 4:26 pm to Dr. Nanda Quinton , who verbally acknowledged these results. Electronically Signed   By: Lowella Grip III M.D.   On: 06/12/2018 16:26   [place] Art/ven Flow Abd Pelv Doppler  Result Date: 06/12/2018 CLINICAL DATA:  Pelvic pain, nausea and diarrhea for 1 night. EXAM: TRANSABDOMINAL AND TRANSVAGINAL ULTRASOUND OF PELVIS DOPPLER ULTRASOUND OF OVARIES TECHNIQUE: Both transabdominal and transvaginal ultrasound examinations of the pelvis were performed. Transabdominal technique was performed for global imaging of the pelvis including uterus, ovaries, adnexal regions, and pelvic cul-de-sac. It was necessary to proceed with endovaginal exam following the transabdominal exam to visualize the adnexa. Color and duplex Doppler ultrasound was utilized to evaluate blood flow to the ovaries. COMPARISON:  CT abdomen and pelvis earlier today and CT chest, abdomen and pelvis 08/19/2012. FINDINGS: Uterus Measurements: 7.3 x 3.9 x 6.0 cm = volume: 89.6 mL. No fibroids or other  mass visualized. Endometrium Thickness: 0.7 cm.  No focal abnormality visualized. Right ovary Measurements: 6.7 x 5.0 x 3.7 cm = volume: 64.3 mL. Cystic lesion with a lacy internal echoes measuring 3.1 x 4.0 x 4.3 cm is seen within the ovary. Left ovary Not visualized. Pulsed Doppler evaluation of the right demonstrates normal low-resistance arterial and venous waveforms. Other findings Small to moderate volume of free pelvic fluid noted. IMPRESSION: Negative for ovarian torsion. Cystic lesion in the right ovary has an appearance most consistent with a hemorrhagic ovarian cyst. Small to moderate volume of free pelvic fluid is likely related to small bowel obstruction seen on the patient's CT today. Electronically Signed   By: Inge Rise M.D.   On: 06/12/2018 17:25   Dg Chest Portable 1 View  Result Date: 06/12/2018 CLINICAL DATA:  Nasogastric tube placement EXAM: PORTABLE CHEST 1 VIEW COMPARISON:  July 02, 2015 FINDINGS: Nasogastric tube is present. Tip of tube appears to be in the stomach region. Side port not appreciable. Lungs are clear. Heart size and pulmonary vascularity are normal. No adenopathy. No bone lesions.  IMPRESSION: Nasogastric tube tip is in the stomach. Side port not seen. It may be prudent to obtain abdominal radiograph to better evaluate nasogastric to location. Lungs clear.  Heart size normal.  No evident adenopathy. Electronically Signed   By: Lowella Grip III M.D.   On: 06/12/2018 17:58     Scheduled Meds: Continuous Infusions:  ciprofloxacin 400 mg (06/13/18 0645)   dextrose 5 % and 0.9% NaCl 100 mL/hr at 06/12/18 2111   metronidazole 500 mg (06/13/18 0936)    Active Problems:   Colitis   SBO (small bowel obstruction) ([place])   Time spent:   Irwin Brakeman, MD [facility] 06/13/2018, 12:47 PM    LOS: 0 days  How to contact the [facility] Attending or Consulting provider [place] or covering provider during after hours [place], for this patient?  1. Check  the care team in [facility] and look for a) attending/consulting [facility] provider listed and b) the [facility] team listed 2. Log into [email or web address] and use [place]'s universal password to access. If you do not have the password, please contact the hospital operator. 3. Locate the [facility] provider you are looking for under [facility] and page to a number that you can be directly reached. 4. If you still have difficulty reaching the provider, please page the [facility] (Director on Call) for the Hospitalists listed on amion for assistance.

## 2018-06-13 NOTE — Progress Notes (Signed)
Patient complaint of heart burn. Patient is npo and has ng tube in place. Paged MD. No new orders at this time.
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
## 2018-06-13 NOTE — Progress Notes (Signed)
MD gave verbal order to give patient gi cocktail through NG tube. Patient tolerated well. Will continue to monitor throughout shift.

## 2018-06-14 DIAGNOSIS — R197 Diarrhea, unspecified: Secondary | ICD-10-CM

## 2018-06-14 DIAGNOSIS — R112 Nausea with vomiting, unspecified: Secondary | ICD-10-CM

## 2018-06-14 DIAGNOSIS — R1031 Right lower quadrant pain: Secondary | ICD-10-CM

## 2018-06-14 LAB — CBC WITH DIFFERENTIAL/PLATELET
Abs Immature Granulocytes: 0.04 10*3/uL (ref 0.00–0.07)
Basophils Absolute: 0 10*3/uL (ref 0.0–0.1)
Basophils Relative: 0 %
Eosinophils Absolute: 0.1 10*3/uL (ref 0.0–0.5)
Eosinophils Relative: 1 %
HCT: 39.4 % (ref 36.0–46.0)
Hemoglobin: 13.4 g/dL (ref 12.0–15.0)
Immature Granulocytes: 0 %
Lymphocytes Relative: 19 %
Lymphs Abs: 2 10*3/uL (ref 0.7–4.0)
MCH: 31.8 pg (ref 26.0–34.0)
MCHC: 34 g/dL (ref 30.0–36.0)
MCV: 93.6 fL (ref 80.0–100.0)
Monocytes Absolute: 0.6 10*3/uL (ref 0.1–1.0)
Monocytes Relative: 6 %
Neutro Abs: 7.5 10*3/uL (ref 1.7–7.7)
Neutrophils Relative %: 74 %
Platelets: 333 10*3/uL (ref 150–400)
RBC: 4.21 MIL/uL (ref 3.87–5.11)
RDW: 11.9 % (ref 11.5–15.5)
WBC: 10.2 10*3/uL (ref 4.0–10.5)
nRBC: 0 % (ref 0.0–0.2)

## 2018-06-14 LAB — MAGNESIUM: Magnesium: 2.2 mg/dL (ref 1.7–2.4)

## 2018-06-14 LAB — COMPREHENSIVE METABOLIC PANEL
ALT: 11 U/L (ref 0–44)
AST: 9 U/L — ABNORMAL LOW (ref 15–41)
Albumin: 2.8 g/dL — ABNORMAL LOW (ref 3.5–5.0)
Alkaline Phosphatase: 47 U/L (ref 38–126)
Anion gap: 11 (ref 5–15)
BUN: 6 mg/dL (ref 6–20)
CO2: 27 mmol/L (ref 22–32)
Calcium: 8.5 mg/dL — ABNORMAL LOW (ref 8.9–10.3)
Chloride: 103 mmol/L (ref 98–111)
Creatinine, Ser: 0.56 mg/dL (ref 0.44–1.00)
GFR calc Af Amer: >60 mL/min (ref >60)
GFR calc non Af Amer: >60 mL/min (ref >60)
Glucose, Bld: 103 mg/dL — ABNORMAL HIGH (ref 70–99)
Potassium: 3.4 mmol/L — ABNORMAL LOW (ref 3.5–5.1)
Sodium: 141 mmol/L (ref 135–145)
Total Bilirubin: 0.6 mg/dL (ref 0.3–1.2)
Total Protein: 5.9 g/dL — ABNORMAL LOW (ref 6.5–8.1)

## 2018-06-14 LAB — HIV ANTIBODY (ROUTINE TESTING W REFLEX): HIV Screen 4th Generation wRfx: NONREACTIVE

## 2018-06-14 MED ORDER — CARBAMAZEPINE ER 300 MG PO CP12: 300.0000 mg | ORAL_CAPSULE | ORAL | Status: DC

## 2018-06-14 MED ORDER — CARIPRAZINE HCL 6 MG PO CAPS: 6.0000 mg | ORAL_CAPSULE | Freq: Every day | ORAL | Status: DC

## 2018-06-14 MED ORDER — KCL IN DEXTROSE-NACL 40-5-0.9 MEQ/L-%-% IV SOLN
INTRAVENOUS | Status: DC
  Administered 2018-06-14 – 2018-06-15 (×2): via INTRAVENOUS

## 2018-06-14 MED ORDER — MONTELUKAST SODIUM 10 MG PO TABS
10.0000 mg | ORAL_TABLET | Freq: Every day | ORAL | Status: DC
  Administered 2018-06-14: 10 mg via ORAL
  Filled 2018-06-14: qty 1

## 2018-06-14 MED ORDER — METOPROLOL SUCCINATE ER 25 MG PO TB24: 25.0000 mg | ORAL_TABLET | Freq: Every evening | ORAL | Status: DC

## 2018-06-14 MED ORDER — FENTANYL CITRATE (PF) 100 MCG/2ML IJ SOLN
50.0000 ug | INTRAMUSCULAR | Status: DC | PRN
  Administered 2018-06-14 – 2018-06-15 (×7): 50 ug via INTRAVENOUS
  Filled 2018-06-14 (×7): qty 2

## 2018-06-14 MED ORDER — FLUOXETINE HCL 20 MG PO CAPS
60.0000 mg | ORAL_CAPSULE | Freq: Every morning | ORAL | Status: DC
  Administered 2018-06-15: 60 mg via ORAL
  Filled 2018-06-14: qty 3

## 2018-06-14 MED ORDER — CLONAZEPAM 0.25 MG PO TBDP
1.0000 mg | ORAL_TABLET | Freq: Three times a day (TID) | ORAL | Status: DC | PRN
  Administered 2018-06-14 (×2): 1 mg via ORAL
  Filled 2018-06-14 (×2): qty 4

## 2018-06-14 MED ORDER — CARBAMAZEPINE ER 100 MG PO TB12
300.0000 mg | ORAL_TABLET | Freq: Every day | ORAL | Status: DC
  Administered 2018-06-15: 300 mg via ORAL
  Filled 2018-06-14: qty 3

## 2018-06-14 MED ORDER — HYDROCODONE-ACETAMINOPHEN 5-325 MG PO TABS: 1.0000 | ORAL_TABLET | ORAL | Status: DC | PRN

## 2018-06-14 MED ORDER — CARBAMAZEPINE ER 100 MG PO TB12
900.0000 mg | ORAL_TABLET | Freq: Every day | ORAL | Status: DC
  Administered 2018-06-14: 22:00:00 900 mg via ORAL
  Filled 2018-06-14: qty 9

## 2018-06-14 NOTE — Progress Notes (Signed)
Asking for home meds.  Reports increased bloating and nausea since staring clears today.  Reported this to Dr. Wynetta Emery

## 2018-06-14 NOTE — Progress Notes (Addendum)
PROGRESS NOTE  [name]  WER:[id number]  DOB: Oct 22, 1985  DOA: 06/12/2018 PCP: Jamesetta Geralds, MD   Brief Admission Hx: 33 year old female presented with lower abdominal pain nausea and vomiting and found to have enterocolitis and a ruptured hemorrhagic right ovarian cyst.  MDM/Assessment & Plan:   1. Enterocolitis-abdominal pain improving slightly today, continue IV antibiotics IV fluids and supportive therapy.  Appreciate general surgery consultation.  IV pain management. 2. Bowel obstruction - NG removed, passing flatus, starting clears.  3. Ruptured hemorrhagic right ovarian cyst-likely the cause of her associated enterocolitis.  Treating supportively. 4. Leukocytosis- WBC trending down, secondary to enterocolitis, follow CBC with differential. 5. Essential hypertension- temporarily holding home metoprolol. 6. Bipolar disorder - resume home meds.  7. Asthma - stable.   DVT prophylaxis: SCD Code Status: Full  Family Communication:  Disposition Plan: inpatient   Consultants:  surgery  Procedures:    Antimicrobials:  Cipro/flagyl 5/26 >   Subjective: Patient reports that she still has significant abdominal pain.  She denies emesis.  She still has some nausea.  Objective: Vitals:   06/13/18 1935 06/13/18 2127 06/14/18 0553 06/14/18 1431  BP:  (!) 139/100 (!) 131/97 113/70  Pulse:  85 73 83  Resp:  20 20 16   Temp:  97.9 F (36.6 C) 98.2 F (36.8 C) 98.6 F (37 C)  TempSrc:  Oral Oral Oral  SpO2: 97% 100% 98% 100%  Weight:      Height:        Intake/Output Summary (Last 24 hours) at 06/14/2018 1613 Last data filed at 06/14/2018 1530 Gross per 24 hour  Intake 1900.82 ml  Output 350 ml  Net 1550.82 ml   Filed Weights   06/12/18 1239  Weight: 81.6 kg   REVIEW OF SYSTEMS  As per history otherwise all reviewed and reported negative  Exam:  General exam: Awake, alert, no apparent distress, cooperative and pleasant. Respiratory system:  Clear. No increased work of breathing. Cardiovascular system: S1 & S2 heard. No JVD, murmurs, gallops, clicks or pedal edema. Gastrointestinal system: Abdomen is nondistended, soft and pronounced left lower quadrant tenderness. Normal bowel sounds heard. Central nervous system: Alert and oriented. No focal neurological deficits. Extremities: no CCE.  Data Reviewed: Basic Metabolic Panel: Recent Labs  Lab 06/12/18 1316 06/13/18 0630 06/14/18 0518  NA 140 140 141  K 4.2 3.4* 3.4*  CL 103 102 103  CO2 26 27 27   GLUCOSE 98 122* 103*  BUN 8 6 6   CREATININE 0.69 0.66 0.56  CALCIUM 9.4 8.5* 8.5*  MG  --   --  2.2   Liver Function Tests: Recent Labs  Lab 06/12/18 1316 06/14/18 0518  AST 10* 9*  ALT 15 11  ALKPHOS 61 47  BILITOT 0.4 0.6  PROT 7.7 5.9*  ALBUMIN 3.9 2.8*   Recent Labs  Lab 06/12/18 1316  LIPASE 29   No results for input(s): AMMONIA in the last 168 hours. CBC: Recent Labs  Lab 06/12/18 1316 06/13/18 0630 06/14/18 0518  WBC 14.5* 14.4* 10.2  NEUTROABS  --   --  7.5  HGB 15.4* 14.7 13.4  HCT 46.9* 43.8 39.4  MCV 94.7 93.8 93.6  PLT 451* 383 333   Cardiac Enzymes: No results for input(s): CKTOTAL, CKMB, CKMBINDEX, TROPONINI in the last 168 hours. CBG (last 3)  No results for input(s): GLUCAP in the last 72 hours. Recent Results (from the past 240 hour(s))  SARS Coronavirus 2 (CEPHEID - Performed in [facility] hospital lab),  Hosp Order     Status: None   Collection Time: 06/12/18  6:50 PM  Result Value Ref Range Status   SARS Coronavirus 2 NEGATIVE NEGATIVE Final    Comment: (NOTE) If result is NEGATIVE SARS-CoV-2 target nucleic acids are NOT DETECTED. The SARS-CoV-2 RNA is generally detectable in upper and lower  respiratory specimens during the acute phase of infection. The lowest  concentration of SARS-CoV-2 viral copies this assay can detect is 250  copies / mL. A negative result does not preclude SARS-CoV-2 infection  and should not be used  as the sole basis for treatment or other  patient management decisions.  A negative result may occur with  improper specimen collection / handling, submission of specimen other  than nasopharyngeal swab, presence of viral mutation(s) within the  areas targeted by this assay, and inadequate number of viral copies  (<250 copies / mL). A negative result must be combined with clinical  observations, patient history, and epidemiological information. If result is POSITIVE SARS-CoV-2 target nucleic acids are DETECTED. The SARS-CoV-2 RNA is generally detectable in upper and lower  respiratory specimens dur ing the acute phase of infection.  Positive  results are indicative of active infection with SARS-CoV-2.  Clinical  correlation with patient history and other diagnostic information is  necessary to determine patient infection status.  Positive results do  not rule out bacterial infection or co-infection with other viruses. If result is PRESUMPTIVE POSTIVE SARS-CoV-2 nucleic acids MAY BE PRESENT.   A presumptive positive result was obtained on the submitted specimen  and confirmed on repeat testing.  While 2019 novel coronavirus  (SARS-CoV-2) nucleic acids may be present in the submitted sample  additional confirmatory testing may be necessary for epidemiological  and / or clinical management purposes  to differentiate between  SARS-CoV-2 and other Sarbecovirus currently known to infect humans.  If clinically indicated additional testing with an alternate test  methodology [phone]) is advised. The SARS-CoV-2 RNA is generally  detectable in upper and lower respiratory sp ecimens during the acute  phase of infection. The expected result is Negative. Fact Sheet for Patients:  StrictlyIdeas.no Fact Sheet for Healthcare Providers: BankingDealers.co.za This test is not yet approved or cleared by the [place] FDA and has been authorized for  detection and/or diagnosis of SARS-CoV-2 by FDA under an Emergency Use Authorization (EUA).  This EUA will remain in effect (meaning this test can be used) for the duration of the COVID-19 declaration under Section 564(b)(1) of the Act, 21 U.S.C. section 360bbb-3(b)(1), unless the authorization is terminated or revoked sooner. Performed at [facility], [address] [place], [place] [address]   C difficile quick scan w PCR reflex     Status: None   Collection Time: 06/13/18  6:20 AM  Result Value Ref Range Status   C Diff antigen NEGATIVE NEGATIVE Final   C Diff toxin NEGATIVE NEGATIVE Final   C Diff interpretation No C. difficile detected.  Final    Comment: Performed at [facility], [address] [place], [place] [address]     Studies: US Transvaginal Non-ob  Result Date: 06/12/2018 CLINICAL DATA:  Pelvic pain, nausea and diarrhea for 1 night. EXAM: TRANSABDOMINAL AND TRANSVAGINAL ULTRASOUND OF PELVIS DOPPLER ULTRASOUND OF OVARIES TECHNIQUE: Both transabdominal and transvaginal ultrasound examinations of the pelvis were performed. Transabdominal technique was performed for global imaging of the pelvis including uterus, ovaries, adnexal regions, and pelvic cul-de-sac. It was necessary to proceed with endovaginal exam following the transabdominal exam  to visualize the adnexa. Color and duplex Doppler ultrasound was utilized to evaluate blood flow to the ovaries. COMPARISON:  CT abdomen and pelvis earlier today and CT chest, abdomen and pelvis 08/19/2012. FINDINGS: Uterus Measurements: 7.3 x 3.9 x 6.0 cm = volume: 89.6 mL. No fibroids or other mass visualized. Endometrium Thickness: 0.7 cm.  No focal abnormality visualized. Right ovary Measurements: 6.7 x 5.0 x 3.7 cm = volume: 64.3 mL. Cystic lesion with a lacy internal echoes measuring 3.1 x 4.0 x 4.3 cm is seen within the ovary. Left ovary Not visualized. Pulsed Doppler evaluation of the right demonstrates normal low-resistance arterial and  venous waveforms. Other findings Small to moderate volume of free pelvic fluid noted. IMPRESSION: Negative for ovarian torsion. Cystic lesion in the right ovary has an appearance most consistent with a hemorrhagic ovarian cyst. Small to moderate volume of free pelvic fluid is likely related to small bowel obstruction seen on the patient's CT today. Electronically Signed   By: Inge Rise M.D.   On: 06/12/2018 17:25   US Pelvis Complete  Result Date: 06/12/2018 CLINICAL DATA:  Pelvic pain, nausea and diarrhea for 1 night. EXAM: TRANSABDOMINAL AND TRANSVAGINAL ULTRASOUND OF PELVIS DOPPLER ULTRASOUND OF OVARIES TECHNIQUE: Both transabdominal and transvaginal ultrasound examinations of the pelvis were performed. Transabdominal technique was performed for global imaging of the pelvis including uterus, ovaries, adnexal regions, and pelvic cul-de-sac. It was necessary to proceed with endovaginal exam following the transabdominal exam to visualize the adnexa. Color and duplex Doppler ultrasound was utilized to evaluate blood flow to the ovaries. COMPARISON:  CT abdomen and pelvis earlier today and CT chest, abdomen and pelvis 08/19/2012. FINDINGS: Uterus Measurements: 7.3 x 3.9 x 6.0 cm = volume: 89.6 mL. No fibroids or other mass visualized. Endometrium Thickness: 0.7 cm.  No focal abnormality visualized. Right ovary Measurements: 6.7 x 5.0 x 3.7 cm = volume: 64.3 mL. Cystic lesion with a lacy internal echoes measuring 3.1 x 4.0 x 4.3 cm is seen within the ovary. Left ovary Not visualized. Pulsed Doppler evaluation of the right demonstrates normal low-resistance arterial and venous waveforms. Other findings Small to moderate volume of free pelvic fluid noted. IMPRESSION: Negative for ovarian torsion. Cystic lesion in the right ovary has an appearance most consistent with a hemorrhagic ovarian cyst. Small to moderate volume of free pelvic fluid is likely related to small bowel obstruction seen on the patient's CT  today. Electronically Signed   By: Inge Rise M.D.   On: 06/12/2018 17:25   [place] Art/ven Flow Abd Pelv Doppler  Result Date: 06/12/2018 CLINICAL DATA:  Pelvic pain, nausea and diarrhea for 1 night. EXAM: TRANSABDOMINAL AND TRANSVAGINAL ULTRASOUND OF PELVIS DOPPLER ULTRASOUND OF OVARIES TECHNIQUE: Both transabdominal and transvaginal ultrasound examinations of the pelvis were performed. Transabdominal technique was performed for global imaging of the pelvis including uterus, ovaries, adnexal regions, and pelvic cul-de-sac. It was necessary to proceed with endovaginal exam following the transabdominal exam to visualize the adnexa. Color and duplex Doppler ultrasound was utilized to evaluate blood flow to the ovaries. COMPARISON:  CT abdomen and pelvis earlier today and CT chest, abdomen and pelvis 08/19/2012. FINDINGS: Uterus Measurements: 7.3 x 3.9 x 6.0 cm = volume: 89.6 mL. No fibroids or other mass visualized. Endometrium Thickness: 0.7 cm.  No focal abnormality visualized. Right ovary Measurements: 6.7 x 5.0 x 3.7 cm = volume: 64.3 mL. Cystic lesion with a lacy internal echoes measuring 3.1 x 4.0 x 4.3 cm is seen within the ovary. Left ovary  Not visualized. Pulsed Doppler evaluation of the right demonstrates normal low-resistance arterial and venous waveforms. Other findings Small to moderate volume of free pelvic fluid noted. IMPRESSION: Negative for ovarian torsion. Cystic lesion in the right ovary has an appearance most consistent with a hemorrhagic ovarian cyst. Small to moderate volume of free pelvic fluid is likely related to small bowel obstruction seen on the patient's CT today. Electronically Signed   By: Inge Rise M.D.   On: 06/12/2018 17:25   Dg Chest Portable 1 View  Result Date: 06/12/2018 CLINICAL DATA:  Nasogastric tube placement EXAM: PORTABLE CHEST 1 VIEW COMPARISON:  July 02, 2015 FINDINGS: Nasogastric tube is present. Tip of tube appears to be in the stomach region. Side port  not appreciable. Lungs are clear. Heart size and pulmonary vascularity are normal. No adenopathy. No bone lesions. IMPRESSION: Nasogastric tube tip is in the stomach. Side port not seen. It may be prudent to obtain abdominal radiograph to better evaluate nasogastric to location. Lungs clear.  Heart size normal.  No evident adenopathy. Electronically Signed   By: Lowella Grip III M.D.   On: 06/12/2018 17:58   Scheduled Meds: Continuous Infusions:  sodium chloride Stopped (06/14/18 0303)   ciprofloxacin 400 mg (06/14/18 0628)   dextrose 5 % and 0.9 % NaCl with KCl 40 mEq/L 75 mL/hr at 06/14/18 1218   famotidine (PEPCID) IV 20 mg (06/14/18 0842)   metronidazole 500 mg (06/14/18 1054)    Active Problems:   Colitis   SBO (small bowel obstruction) ([place])   Time spent:   Irwin Brakeman, MD [facility] 06/14/2018, 4:13 PM    LOS: 1 day  How to contact the [facility] Attending or Consulting provider [place] or covering provider during after hours [place], for this patient?  1. Check the care team in [facility] and look for a) attending/consulting [facility] provider listed and b) the [facility] team listed 2. Log into [email or web address] and use [place]'s universal password to access. If you do not have the password, please contact the hospital operator. 3. Locate the [facility] provider you are looking for under [facility] and page to a number that you can be directly reached. 4. If you still have difficulty reaching the provider, please page the [facility] (Director on Call) for the Hospitalists listed on amion for assistance.

## 2018-06-14 NOTE — Progress Notes (Signed)
Since Dr. Arnoldo Morale removed NG tube this morning patient denies any increase in pain or nausea.  Has been drinking clear liquids.  Is tolerating fentanyl better that dilaudid.

## 2018-06-14 NOTE — Progress Notes (Signed)
Subjective: Patient states she feels slightly better, although still has crampy abdominal pain.  She is passing gas.  Dilaudid seems to make her cramping worse.  Objective: Vital signs in last 24 hours: Temp:  [97.8 F (36.6 C)-98.2 F (36.8 C)] 98.2 F (36.8 C) (05/28 0553) Pulse Rate:  [73-85] 73 (05/28 0553) Resp:  [18-20] 20 (05/28 0553) BP: (131-139)/(94-100) 131/97 (05/28 0553) SpO2:  [97 %-100 %] 98 % (05/28 0553) Last BM Date: 06/13/18  Intake/Output from previous day: 05/27 0701 - 05/28 0700 In: 1462.9 [I.V.:925; IV Piggyback:537.9] Out: 900 [Urine:300; Emesis/NG output:600] Intake/Output this shift: No intake/output data recorded.  General appearance: alert, cooperative and no distress GI: Soft with nonspecific tenderness to palpation.  No rigidity is noted.  Bowel sounds present.  Lab Results:  Recent Labs    06/13/18 0630 06/14/18 0518  WBC 14.4* 10.2  HGB 14.7 13.4  HCT 43.8 39.4  PLT 383 333   BMET Recent Labs    06/13/18 0630 06/14/18 0518  NA 140 141  K 3.4* 3.4*  CL 102 103  CO2 27 27  GLUCOSE 122* 103*  BUN 6 6  CREATININE 0.66 0.56  CALCIUM 8.5* 8.5*   PT/INR No results for input(s): LABPROT, INR in the last 72 hours.  Studies/Results: US Transvaginal Non-ob  Result Date: 06/12/2018 CLINICAL DATA:  Pelvic pain, nausea and diarrhea for 1 night. EXAM: TRANSABDOMINAL AND TRANSVAGINAL ULTRASOUND OF PELVIS DOPPLER ULTRASOUND OF OVARIES TECHNIQUE: Both transabdominal and transvaginal ultrasound examinations of the pelvis were performed. Transabdominal technique was performed for global imaging of the pelvis including uterus, ovaries, adnexal regions, and pelvic cul-de-sac. It was necessary to proceed with endovaginal exam following the transabdominal exam to visualize the adnexa. Color and duplex Doppler ultrasound was utilized to evaluate blood flow to the ovaries. COMPARISON:  CT abdomen and pelvis earlier today and CT chest, abdomen and  pelvis 08/19/2012. FINDINGS: Uterus Measurements: 7.3 x 3.9 x 6.0 cm = volume: 89.6 mL. No fibroids or other mass visualized. Endometrium Thickness: 0.7 cm.  No focal abnormality visualized. Right ovary Measurements: 6.7 x 5.0 x 3.7 cm = volume: 64.3 mL. Cystic lesion with a lacy internal echoes measuring 3.1 x 4.0 x 4.3 cm is seen within the ovary. Left ovary Not visualized. Pulsed Doppler evaluation of the right demonstrates normal low-resistance arterial and venous waveforms. Other findings Small to moderate volume of free pelvic fluid noted. IMPRESSION: Negative for ovarian torsion. Cystic lesion in the right ovary has an appearance most consistent with a hemorrhagic ovarian cyst. Small to moderate volume of free pelvic fluid is likely related to small bowel obstruction seen on the patient's CT today. Electronically Signed   By: Inge Rise M.D.   On: 06/12/2018 17:25   US Pelvis Complete  Result Date: 06/12/2018 CLINICAL DATA:  Pelvic pain, nausea and diarrhea for 1 night. EXAM: TRANSABDOMINAL AND TRANSVAGINAL ULTRASOUND OF PELVIS DOPPLER ULTRASOUND OF OVARIES TECHNIQUE: Both transabdominal and transvaginal ultrasound examinations of the pelvis were performed. Transabdominal technique was performed for global imaging of the pelvis including uterus, ovaries, adnexal regions, and pelvic cul-de-sac. It was necessary to proceed with endovaginal exam following the transabdominal exam to visualize the adnexa. Color and duplex Doppler ultrasound was utilized to evaluate blood flow to the ovaries. COMPARISON:  CT abdomen and pelvis earlier today and CT chest, abdomen and pelvis 08/19/2012. FINDINGS: Uterus Measurements: 7.3 x 3.9 x 6.0 cm = volume: 89.6 mL. No fibroids or other mass visualized. Endometrium Thickness: 0.7 cm.  No  focal abnormality visualized. Right ovary Measurements: 6.7 x 5.0 x 3.7 cm = volume: 64.3 mL. Cystic lesion with a lacy internal echoes measuring 3.1 x 4.0 x 4.3 cm is seen within the  ovary. Left ovary Not visualized. Pulsed Doppler evaluation of the right demonstrates normal low-resistance arterial and venous waveforms. Other findings Small to moderate volume of free pelvic fluid noted. IMPRESSION: Negative for ovarian torsion. Cystic lesion in the right ovary has an appearance most consistent with a hemorrhagic ovarian cyst. Small to moderate volume of free pelvic fluid is likely related to small bowel obstruction seen on the patient's CT today. Electronically Signed   By: Inge Rise M.D.   On: 06/12/2018 17:25   Ct Abdomen Pelvis W Contrast  Result Date: 06/12/2018 CLINICAL DATA:  Lower abdominal pain with nausea diarrhea EXAM: CT ABDOMEN AND PELVIS WITH CONTRAST TECHNIQUE: Multidetector CT imaging of the abdomen and pelvis was performed using the standard protocol following bolus administration of intravenous contrast. CONTRAST:  119mL OMNIPAQUE IOHEXOL 300 MG/ML  SOLN COMPARISON:  August 19, 2012 FINDINGS: Lower chest: Lung bases are clear. Hepatobiliary: No focal liver lesions are appreciable. The gallbladder wall is not appreciably thickened. There is no biliary duct dilatation. Pancreas: There is no appreciable pancreatic mass or inflammatory focus. Spleen: No splenic lesions are evident. A small accessory spleen is noted posterior to the spleen inferiorly. Adrenals/Urinary Tract: Adrenals bilaterally appear normal. There is a 5 mm probable cyst arising from the lower pole right kidney. A 6 mm apparent cyst arises inferiorly from the right kidney. There is no evident hydronephrosis on either side. There is no appreciable renal or ureteral calculus on either side. Urinary bladder is midline with wall thickness slightly increased. Stomach/Bowel: There is liquid stool throughout most of the colon. There is mild generalized small bowel dilatation with moderate fluid in the small bowel and overall mildly thickened small bowel loops. There is a transition zone near the junction the  jejunum and ileum, felt to represent a degree of small bowel obstruction. There is no free air or portal venous air. The terminal ileum region appears normal. Vascular/Lymphatic: There is no abdominal aortic aneurysm. No vascular lesions are evident. There is no adenopathy appreciable in the abdomen or pelvis. Reproductive: The uterus is mildly retroverted. There is a cystic structure arising from the right adnexa measuring 4.3 x 3.3 cm. There are smaller cystic areas, likely representing follicles in the right adnexa as well. There is enhancement along the superior aspect of the right adnexal region. There is ascitic fluid in the cul-de-sac region as well as anterior to the urinary bladder. Other: Appendix appears within normal limits. No abscess is evident in the abdomen or pelvis. There are areas of ascites in the pelvis. Musculoskeletal: There is no blastic or lytic bone lesion. No intramuscular or abdominal wall lesion. IMPRESSION: 1. Cystic areas in an enlarged right ovary with enhancement along the periphery of this enlarged right ovary. This appearance raises concern for potential ovarian torsion with potential peripheral hyperemia. Pelvic ultrasound with Doppler assessment advised in this regard. 2. Small bowel obstruction with transition zone near the jejunoileal junction. There may be a degree of underlying colitis given the diffuse liquid stool throughout the colon. 3.  Moderate ascites which may be partially loculated in the pelvis. 4. Appendix felt to be within normal limits. No abscess in the abdomen or pelvis. Critical Value/emergent results were called by telephone at the time of interpretation on 06/12/2018 at 4:26 pm to Dr. Vonna Kotyk  LONG , who verbally acknowledged these results. Electronically Signed   By: Lowella Grip III M.D.   On: 06/12/2018 16:26   [place] Art/ven Flow Abd Pelv Doppler  Result Date: 06/12/2018 CLINICAL DATA:  Pelvic pain, nausea and diarrhea for 1 night. EXAM: TRANSABDOMINAL  AND TRANSVAGINAL ULTRASOUND OF PELVIS DOPPLER ULTRASOUND OF OVARIES TECHNIQUE: Both transabdominal and transvaginal ultrasound examinations of the pelvis were performed. Transabdominal technique was performed for global imaging of the pelvis including uterus, ovaries, adnexal regions, and pelvic cul-de-sac. It was necessary to proceed with endovaginal exam following the transabdominal exam to visualize the adnexa. Color and duplex Doppler ultrasound was utilized to evaluate blood flow to the ovaries. COMPARISON:  CT abdomen and pelvis earlier today and CT chest, abdomen and pelvis 08/19/2012. FINDINGS: Uterus Measurements: 7.3 x 3.9 x 6.0 cm = volume: 89.6 mL. No fibroids or other mass visualized. Endometrium Thickness: 0.7 cm.  No focal abnormality visualized. Right ovary Measurements: 6.7 x 5.0 x 3.7 cm = volume: 64.3 mL. Cystic lesion with a lacy internal echoes measuring 3.1 x 4.0 x 4.3 cm is seen within the ovary. Left ovary Not visualized. Pulsed Doppler evaluation of the right demonstrates normal low-resistance arterial and venous waveforms. Other findings Small to moderate volume of free pelvic fluid noted. IMPRESSION: Negative for ovarian torsion. Cystic lesion in the right ovary has an appearance most consistent with a hemorrhagic ovarian cyst. Small to moderate volume of free pelvic fluid is likely related to small bowel obstruction seen on the patient's CT today. Electronically Signed   By: Inge Rise M.D.   On: 06/12/2018 17:25   Dg Chest Portable 1 View  Result Date: 06/12/2018 CLINICAL DATA:  Nasogastric tube placement EXAM: PORTABLE CHEST 1 VIEW COMPARISON:  July 02, 2015 FINDINGS: Nasogastric tube is present. Tip of tube appears to be in the stomach region. Side port not appreciable. Lungs are clear. Heart size and pulmonary vascularity are normal. No adenopathy. No bone lesions. IMPRESSION: Nasogastric tube tip is in the stomach. Side port not seen. It may be prudent to obtain abdominal  radiograph to better evaluate nasogastric to location. Lungs clear.  Heart size normal.  No evident adenopathy. Electronically Signed   By: Lowella Grip III M.D.   On: 06/12/2018 17:58    Anti-infectives: Anti-infectives (From admission, onward)   Start     Dose/Rate Route Frequency Ordered Stop   06/12/18 1845  ciprofloxacin (CIPRO) IVPB 400 mg     400 mg 200 mL/hr over 60 Minutes Intravenous Every 12 hours 06/12/18 1838     06/12/18 1845  metroNIDAZOLE (FLAGYL) IVPB 500 mg     500 mg 100 mL/hr over 60 Minutes Intravenous Every 8 hours 06/12/18 1838        Assessment/Plan: Imp: Bowel obstruction/ileus secondary to ruptured hemorrhagic cyst with resultant enterocolitis.  NG tube output has decreased and is now clear.  No need for surgical intervention. Plan: NG tube has been removed.  Will advance to clear liquid diet.  Will change Dilaudid to fentanyl.  LOS: 1 day    Aviva Signs 06/14/2018

## 2018-06-15 DIAGNOSIS — J45909 Unspecified asthma, uncomplicated: Secondary | ICD-10-CM

## 2018-06-15 DIAGNOSIS — K5669 Other partial intestinal obstruction: Secondary | ICD-10-CM

## 2018-06-15 DIAGNOSIS — N83291 Other ovarian cyst, right side: Secondary | ICD-10-CM

## 2018-06-15 DIAGNOSIS — K529 Noninfective gastroenteritis and colitis, unspecified: Secondary | ICD-10-CM | POA: Diagnosis present

## 2018-06-15 DIAGNOSIS — F319 Bipolar disorder, unspecified: Secondary | ICD-10-CM

## 2018-06-15 DIAGNOSIS — I10 Essential (primary) hypertension: Secondary | ICD-10-CM

## 2018-06-15 DIAGNOSIS — N83209 Unspecified ovarian cyst, unspecified side: Secondary | ICD-10-CM | POA: Diagnosis present

## 2018-06-15 LAB — CBC WITH DIFFERENTIAL/PLATELET
Abs Immature Granulocytes: 0.05 10*3/uL (ref 0.00–0.07)
Basophils Absolute: 0 10*3/uL (ref 0.0–0.1)
Basophils Relative: 0 %
Eosinophils Absolute: 0.2 10*3/uL (ref 0.0–0.5)
Eosinophils Relative: 2 %
HCT: 38.2 % (ref 36.0–46.0)
Hemoglobin: 12.8 g/dL (ref 12.0–15.0)
Immature Granulocytes: 1 %
Lymphocytes Relative: 25 %
Lymphs Abs: 2.2 10*3/uL (ref 0.7–4.0)
MCH: 31.2 pg (ref 26.0–34.0)
MCHC: 33.5 g/dL (ref 30.0–36.0)
MCV: 93.2 fL (ref 80.0–100.0)
Monocytes Absolute: 0.6 10*3/uL (ref 0.1–1.0)
Monocytes Relative: 7 %
Neutro Abs: 5.6 10*3/uL (ref 1.7–7.7)
Neutrophils Relative %: 65 %
Platelets: 360 10*3/uL (ref 150–400)
RBC: 4.1 MIL/uL (ref 3.87–5.11)
RDW: 11.8 % (ref 11.5–15.5)
WBC: 8.6 10*3/uL (ref 4.0–10.5)
nRBC: 0 % (ref 0.0–0.2)

## 2018-06-15 LAB — COMPREHENSIVE METABOLIC PANEL
ALT: 21 U/L (ref 0–44)
AST: 23 U/L (ref 15–41)
Albumin: 2.9 g/dL — ABNORMAL LOW (ref 3.5–5.0)
Alkaline Phosphatase: 50 U/L (ref 38–126)
Anion gap: 9 (ref 5–15)
BUN: <5 mg/dL — ABNORMAL LOW (ref 6–20)
CO2: 24 mmol/L (ref 22–32)
Calcium: 8.4 mg/dL — ABNORMAL LOW (ref 8.9–10.3)
Chloride: 106 mmol/L (ref 98–111)
Creatinine, Ser: 0.49 mg/dL (ref 0.44–1.00)
GFR calc Af Amer: >60 mL/min (ref >60)
GFR calc non Af Amer: >60 mL/min (ref >60)
Glucose, Bld: 99 mg/dL (ref 70–99)
Potassium: 3.6 mmol/L (ref 3.5–5.1)
Sodium: 139 mmol/L (ref 135–145)
Total Bilirubin: 0.5 mg/dL (ref 0.3–1.2)
Total Protein: 5.9 g/dL — ABNORMAL LOW (ref 6.5–8.1)

## 2018-06-15 LAB — MAGNESIUM: Magnesium: 1.9 mg/dL (ref 1.7–2.4)

## 2018-06-15 MED ORDER — OMEPRAZOLE 40 MG PO CPDR: 40.0000 mg | DELAYED_RELEASE_CAPSULE | Freq: Every day | ORAL | 0 refills | Status: DC

## 2018-06-15 MED ORDER — POLYETHYLENE GLYCOL 3350 17 G PO PACK: 17.0000 g | PACK | Freq: Every day | ORAL | 0 refills | Status: DC | PRN

## 2018-06-15 MED ORDER — CIPROFLOXACIN HCL 500 MG PO TABS: 500.0000 mg | ORAL_TABLET | Freq: Two times a day (BID) | ORAL | 0 refills | Status: AC

## 2018-06-15 MED ORDER — HYDROCODONE-ACETAMINOPHEN 5-325 MG PO TABS: 1.0000 | ORAL_TABLET | Freq: Four times a day (QID) | ORAL | 0 refills | Status: DC | PRN

## 2018-06-15 MED ORDER — METRONIDAZOLE 500 MG PO TABS: 500.0000 mg | ORAL_TABLET | Freq: Three times a day (TID) | ORAL | 0 refills | Status: AC

## 2018-06-15 NOTE — Care Management Important Message (Signed)
Important Message  Patient Details  Name: [name] MRN: [id number] Date of Birth: 08-Oct-1985   Medicare Important Message Given:  Yes    [name] 06/15/2018, 2:29 PM

## 2018-06-15 NOTE — Progress Notes (Signed)
IV removed and discharge instructions reviewed.  Scripts sent to pharmacy.  Waiting on ride home

## 2018-06-15 NOTE — Discharge Summary (Signed)
Physician Discharge Summary  [name]:[id number] DOB: 29-Jun-1985 DOA: 06/12/2018  PCP: Jamesetta Geralds, MD  Admit date: 06/12/2018 Discharge date: 06/15/2018  Admitted From:  Home  Disposition: Home   Recommendations for Outpatient Follow-up:  1. Follow up with PCP in 1 weeks   Discharge Condition: STABLE   CODE STATUS: FULL    Brief Hospitalization Summary: Please see all hospital notes, images, labs for full details of the hospitalization. Dr. Talmadge Coventry HPI: [name] is a 33 y.o. female with medical history significant for hypertension, asthma, obsessive-compulsive disorder, bipolar disorder, anxiety, tobacco abuse, presented to the ED with complaints of right lower abdominal pain for 2 days.  She reports nausea, and 2-3 episodes of nonbloody watery stools over the past 2 days.  No vomiting.  No pain with urination.  No fever or chills.  No known COVID positive patient contacts.  Patient has never had  Abdominal surgeries, never had cesarean section either.  No known family members with bowel disease.  ED Course: Temperature 98.5.  Blood pressure 1 35-1 49.  WBC 14.5.  Creatinine at baseline 0.69.  UA-dirty sample with large leukocytes and rare bacteria.  Abdominal & pelvic CT with contrast-concern for potential ovarian torsion pelvic ultrasound was recommended.  Small bowel obstruction also suggested and degree of underlying colitis.( Pls see detailed report). EDP also Talked with OB, Dr. Feliberto Gottron the CT I recommended transvaginal ultrasound.   Subsequent pelvic and transvaginal ultrasound was negative for ovarian torsion. Dr. Arnoldo Morale with gen Surg to see patient in a.m. NG tube placed.  Hospitalist admit for colitis and SBO.  Brief Admission Hx: 33 year old female presented with lower abdominal pain nausea and vomiting and found to have enterocolitis and a ruptured hemorrhagic right ovarian cyst.  MDM/Assessment & Plan:   1. Enterocolitis-secondary  to ruptured ovarian cyst, abdominal pain improving and patient tolerating diet, advanced to full liquid diet and patient has had multiple loose stools.  I spoke with surgery, can discharge home. Continue liquid / soft diet for next few days.  Finish course of cipro/flagyl. Recommended probiotics.  Rx for omeprazole. Rx for vicodin #12 tabs as needed for severe pain only.   Appreciate general surgery consultation.  Pt advised to return if symptoms come back or new problem develops.   2. Bowel obstruction - NG removed, passing flatus, having multiple bowel movements. 3. Ruptured hemorrhagic right ovarian cyst-likely the cause of her associated enterocolitis.  Treated supportively. 4. Leukocytosis- WBC trending down, secondary to enterocolitis, follow CBC with differential. 5. Essential hypertension- temporarily holding home metoprolol. 6. Bipolar disorder - resume home meds.  7. Asthma - stable.   DVT prophylaxis: SCD Code Status: Full  Family Communication:  Disposition Plan: inpatient   Consultants:  surgery  Procedures:    Antimicrobials:  Cipro/flagyl 5/26 >   Discharge Diagnoses:  Active Problems:   Colitis   RLQ abdominal pain   Ruptured Ovarian cyst   Enterocolitis   Discharge Instructions: Discharge Instructions    Call MD for:  difficulty breathing, headache or visual disturbances   Complete by:  As directed    Call MD for:  extreme fatigue   Complete by:  As directed    Call MD for:  persistant nausea and vomiting   Complete by:  As directed    Call MD for:  severe uncontrolled pain   Complete by:  As directed    Diet - low sodium heart healthy   Complete by:  As directed  Increase activity slowly   Complete by:  As directed      Allergies as of 06/15/2018      Reactions   Amoxicillin Nausea And Vomiting, Rash      Medication List    TAKE these medications   albuterol 108 (90 Base) MCG/ACT inhaler Commonly known as:  VENTOLIN HFA Inhale 2 puffs  into the lungs every 4 (four) hours as needed for wheezing or shortness of breath.   amphetamine-dextroamphetamine 25 MG 24 hr capsule Commonly known as:  ADDERALL XR Take 25 mg by mouth every morning.   ciprofloxacin 500 MG tablet Commonly known as:  CIPRO Take 1 tablet (500 mg total) by mouth 2 (two) times daily for 6 days.   clonazePAM 1 MG disintegrating tablet Commonly known as:  KLONOPIN Take 1 mg by mouth 3 (three) times daily as needed (for anxiety/sleep).   Equetro 300 MG Cp12 Generic drug:  Carbamazepine Take 300-900 mg by mouth See admin instructions. 300mg  in the morning and 900mg  at bedtime   FLUoxetine 20 MG capsule Commonly known as:  PROZAC Take 60 mg by mouth every morning.   fluticasone 110 MCG/ACT inhaler Commonly known as:  FLOVENT HFA Inhale 2 puffs into the lungs 2 (two) times daily.   HYDROcodone-acetaminophen 5-325 MG tablet Commonly known as:  NORCO/VICODIN Take 1 tablet by mouth every 6 (six) hours as needed for severe pain.   metoprolol succinate 25 MG 24 hr tablet Commonly known as:  TOPROL-XL Take 25 mg by mouth every evening.   metroNIDAZOLE 500 MG tablet Commonly known as:  FLAGYL Take 1 tablet (500 mg total) by mouth 3 (three) times daily for 6 days.   montelukast 10 MG tablet Commonly known as:  SINGULAIR Take 10 mg by mouth at bedtime.   Olopatadine HCl 0.2 % Soln Apply 1 drop to eye daily as needed (for allergy eye relief).   omeprazole 40 MG capsule Commonly known as:  PRILOSEC Take 1 capsule (40 mg total) by mouth daily for 30 days.   polyethylene glycol 17 g packet Commonly known as:  MIRALAX / GLYCOLAX Take 17 g by mouth daily as needed for mild constipation.   promethazine 25 MG tablet Commonly known as:  PHENERGAN Take 25 mg by mouth every 6 (six) hours as needed for nausea or vomiting.   temazepam 30 MG capsule Commonly known as:  RESTORIL Take 60 mg by mouth at bedtime.   tiZANidine 4 MG capsule Commonly known  as:  ZANAFLEX Take 8 mg by mouth at bedtime.   Vraylar 6 MG Caps Generic drug:  Cariprazine HCl Take 6 mg by mouth at bedtime.      Follow-up Information    Radiontchenko, Alexei, MD. Schedule an appointment as soon as possible for a visit in 1 week(s).   Specialty:  Family Medicine Contact information: [address] [place] [address] [phone]        Aviva Signs, MD. Schedule an appointment as soon as possible for a visit.   Specialty:  General Surgery Why:  If you have any problems Contact information: [address] [place] [address] [phone]          Allergies  Allergen Reactions  . Amoxicillin Nausea And Vomiting and Rash   Allergies as of 06/15/2018      Reactions   Amoxicillin Nausea And Vomiting, Rash      Medication List    TAKE these medications   albuterol 108 (90 Base) MCG/ACT inhaler Commonly known  as:  VENTOLIN HFA Inhale 2 puffs into the lungs every 4 (four) hours as needed for wheezing or shortness of breath.   amphetamine-dextroamphetamine 25 MG 24 hr capsule Commonly known as:  ADDERALL XR Take 25 mg by mouth every morning.   ciprofloxacin 500 MG tablet Commonly known as:  CIPRO Take 1 tablet (500 mg total) by mouth 2 (two) times daily for 6 days.   clonazePAM 1 MG disintegrating tablet Commonly known as:  KLONOPIN Take 1 mg by mouth 3 (three) times daily as needed (for anxiety/sleep).   Equetro 300 MG Cp12 Generic drug:  Carbamazepine Take 300-900 mg by mouth See admin instructions. 300mg  in the morning and 900mg  at bedtime   FLUoxetine 20 MG capsule Commonly known as:  PROZAC Take 60 mg by mouth every morning.   fluticasone 110 MCG/ACT inhaler Commonly known as:  FLOVENT HFA Inhale 2 puffs into the lungs 2 (two) times daily.   HYDROcodone-acetaminophen 5-325 MG tablet Commonly known as:  NORCO/VICODIN Take 1 tablet by mouth every 6 (six) hours as needed for severe pain.   metoprolol succinate 25  MG 24 hr tablet Commonly known as:  TOPROL-XL Take 25 mg by mouth every evening.   metroNIDAZOLE 500 MG tablet Commonly known as:  FLAGYL Take 1 tablet (500 mg total) by mouth 3 (three) times daily for 6 days.   montelukast 10 MG tablet Commonly known as:  SINGULAIR Take 10 mg by mouth at bedtime.   Olopatadine HCl 0.2 % Soln Apply 1 drop to eye daily as needed (for allergy eye relief).   omeprazole 40 MG capsule Commonly known as:  PRILOSEC Take 1 capsule (40 mg total) by mouth daily for 30 days.   polyethylene glycol 17 g packet Commonly known as:  MIRALAX / GLYCOLAX Take 17 g by mouth daily as needed for mild constipation.   promethazine 25 MG tablet Commonly known as:  PHENERGAN Take 25 mg by mouth every 6 (six) hours as needed for nausea or vomiting.   temazepam 30 MG capsule Commonly known as:  RESTORIL Take 60 mg by mouth at bedtime.   tiZANidine 4 MG capsule Commonly known as:  ZANAFLEX Take 8 mg by mouth at bedtime.   Vraylar 6 MG Caps Generic drug:  Cariprazine HCl Take 6 mg by mouth at bedtime.       Procedures/Studies: US Transvaginal Non-ob  Result Date: 06/12/2018 CLINICAL DATA:  Pelvic pain, nausea and diarrhea for 1 night. EXAM: TRANSABDOMINAL AND TRANSVAGINAL ULTRASOUND OF PELVIS DOPPLER ULTRASOUND OF OVARIES TECHNIQUE: Both transabdominal and transvaginal ultrasound examinations of the pelvis were performed. Transabdominal technique was performed for global imaging of the pelvis including uterus, ovaries, adnexal regions, and pelvic cul-de-sac. It was necessary to proceed with endovaginal exam following the transabdominal exam to visualize the adnexa. Color and duplex Doppler ultrasound was utilized to evaluate blood flow to the ovaries. COMPARISON:  CT abdomen and pelvis earlier today and CT chest, abdomen and pelvis 08/19/2012. FINDINGS: Uterus Measurements: 7.3 x 3.9 x 6.0 cm = volume: 89.6 mL. No fibroids or other mass visualized. Endometrium  Thickness: 0.7 cm.  No focal abnormality visualized. Right ovary Measurements: 6.7 x 5.0 x 3.7 cm = volume: 64.3 mL. Cystic lesion with a lacy internal echoes measuring 3.1 x 4.0 x 4.3 cm is seen within the ovary. Left ovary Not visualized. Pulsed Doppler evaluation of the right demonstrates normal low-resistance arterial and venous waveforms. Other findings Small to moderate volume of free pelvic fluid noted. IMPRESSION: Negative  for ovarian torsion. Cystic lesion in the right ovary has an appearance most consistent with a hemorrhagic ovarian cyst. Small to moderate volume of free pelvic fluid is likely related to small bowel obstruction seen on the patient's CT today. Electronically Signed   By: Inge Rise M.D.   On: 06/12/2018 17:25   US Pelvis Complete  Result Date: 06/12/2018 CLINICAL DATA:  Pelvic pain, nausea and diarrhea for 1 night. EXAM: TRANSABDOMINAL AND TRANSVAGINAL ULTRASOUND OF PELVIS DOPPLER ULTRASOUND OF OVARIES TECHNIQUE: Both transabdominal and transvaginal ultrasound examinations of the pelvis were performed. Transabdominal technique was performed for global imaging of the pelvis including uterus, ovaries, adnexal regions, and pelvic cul-de-sac. It was necessary to proceed with endovaginal exam following the transabdominal exam to visualize the adnexa. Color and duplex Doppler ultrasound was utilized to evaluate blood flow to the ovaries. COMPARISON:  CT abdomen and pelvis earlier today and CT chest, abdomen and pelvis 08/19/2012. FINDINGS: Uterus Measurements: 7.3 x 3.9 x 6.0 cm = volume: 89.6 mL. No fibroids or other mass visualized. Endometrium Thickness: 0.7 cm.  No focal abnormality visualized. Right ovary Measurements: 6.7 x 5.0 x 3.7 cm = volume: 64.3 mL. Cystic lesion with a lacy internal echoes measuring 3.1 x 4.0 x 4.3 cm is seen within the ovary. Left ovary Not visualized. Pulsed Doppler evaluation of the right demonstrates normal low-resistance arterial and venous waveforms.  Other findings Small to moderate volume of free pelvic fluid noted. IMPRESSION: Negative for ovarian torsion. Cystic lesion in the right ovary has an appearance most consistent with a hemorrhagic ovarian cyst. Small to moderate volume of free pelvic fluid is likely related to small bowel obstruction seen on the patient's CT today. Electronically Signed   By: Inge Rise M.D.   On: 06/12/2018 17:25   Ct Abdomen Pelvis W Contrast  Result Date: 06/12/2018 CLINICAL DATA:  Lower abdominal pain with nausea diarrhea EXAM: CT ABDOMEN AND PELVIS WITH CONTRAST TECHNIQUE: Multidetector CT imaging of the abdomen and pelvis was performed using the standard protocol following bolus administration of intravenous contrast. CONTRAST:  163mL OMNIPAQUE IOHEXOL 300 MG/ML  SOLN COMPARISON:  August 19, 2012 FINDINGS: Lower chest: Lung bases are clear. Hepatobiliary: No focal liver lesions are appreciable. The gallbladder wall is not appreciably thickened. There is no biliary duct dilatation. Pancreas: There is no appreciable pancreatic mass or inflammatory focus. Spleen: No splenic lesions are evident. A small accessory spleen is noted posterior to the spleen inferiorly. Adrenals/Urinary Tract: Adrenals bilaterally appear normal. There is a 5 mm probable cyst arising from the lower pole right kidney. A 6 mm apparent cyst arises inferiorly from the right kidney. There is no evident hydronephrosis on either side. There is no appreciable renal or ureteral calculus on either side. Urinary bladder is midline with wall thickness slightly increased. Stomach/Bowel: There is liquid stool throughout most of the colon. There is mild generalized small bowel dilatation with moderate fluid in the small bowel and overall mildly thickened small bowel loops. There is a transition zone near the junction the jejunum and ileum, felt to represent a degree of small bowel obstruction. There is no free air or portal venous air. The terminal ileum region  appears normal. Vascular/Lymphatic: There is no abdominal aortic aneurysm. No vascular lesions are evident. There is no adenopathy appreciable in the abdomen or pelvis. Reproductive: The uterus is mildly retroverted. There is a cystic structure arising from the right adnexa measuring 4.3 x 3.3 cm. There are smaller cystic areas, likely representing follicles in  the right adnexa as well. There is enhancement along the superior aspect of the right adnexal region. There is ascitic fluid in the cul-de-sac region as well as anterior to the urinary bladder. Other: Appendix appears within normal limits. No abscess is evident in the abdomen or pelvis. There are areas of ascites in the pelvis. Musculoskeletal: There is no blastic or lytic bone lesion. No intramuscular or abdominal wall lesion. IMPRESSION: 1. Cystic areas in an enlarged right ovary with enhancement along the periphery of this enlarged right ovary. This appearance raises concern for potential ovarian torsion with potential peripheral hyperemia. Pelvic ultrasound with Doppler assessment advised in this regard. 2. Small bowel obstruction with transition zone near the jejunoileal junction. There may be a degree of underlying colitis given the diffuse liquid stool throughout the colon. 3.  Moderate ascites which may be partially loculated in the pelvis. 4. Appendix felt to be within normal limits. No abscess in the abdomen or pelvis. Critical Value/emergent results were called by telephone at the time of interpretation on 06/12/2018 at 4:26 pm to Dr. Nanda Quinton , who verbally acknowledged these results. Electronically Signed   By: Lowella Grip III M.D.   On: 06/12/2018 16:26   [place] Art/ven Flow Abd Pelv Doppler  Result Date: 06/12/2018 CLINICAL DATA:  Pelvic pain, nausea and diarrhea for 1 night. EXAM: TRANSABDOMINAL AND TRANSVAGINAL ULTRASOUND OF PELVIS DOPPLER ULTRASOUND OF OVARIES TECHNIQUE: Both transabdominal and transvaginal ultrasound examinations of  the pelvis were performed. Transabdominal technique was performed for global imaging of the pelvis including uterus, ovaries, adnexal regions, and pelvic cul-de-sac. It was necessary to proceed with endovaginal exam following the transabdominal exam to visualize the adnexa. Color and duplex Doppler ultrasound was utilized to evaluate blood flow to the ovaries. COMPARISON:  CT abdomen and pelvis earlier today and CT chest, abdomen and pelvis 08/19/2012. FINDINGS: Uterus Measurements: 7.3 x 3.9 x 6.0 cm = volume: 89.6 mL. No fibroids or other mass visualized. Endometrium Thickness: 0.7 cm.  No focal abnormality visualized. Right ovary Measurements: 6.7 x 5.0 x 3.7 cm = volume: 64.3 mL. Cystic lesion with a lacy internal echoes measuring 3.1 x 4.0 x 4.3 cm is seen within the ovary. Left ovary Not visualized. Pulsed Doppler evaluation of the right demonstrates normal low-resistance arterial and venous waveforms. Other findings Small to moderate volume of free pelvic fluid noted. IMPRESSION: Negative for ovarian torsion. Cystic lesion in the right ovary has an appearance most consistent with a hemorrhagic ovarian cyst. Small to moderate volume of free pelvic fluid is likely related to small bowel obstruction seen on the patient's CT today. Electronically Signed   By: Inge Rise M.D.   On: 06/12/2018 17:25   Dg Chest Portable 1 View  Result Date: 06/12/2018 CLINICAL DATA:  Nasogastric tube placement EXAM: PORTABLE CHEST 1 VIEW COMPARISON:  July 02, 2015 FINDINGS: Nasogastric tube is present. Tip of tube appears to be in the stomach region. Side port not appreciable. Lungs are clear. Heart size and pulmonary vascularity are normal. No adenopathy. No bone lesions. IMPRESSION: Nasogastric tube tip is in the stomach. Side port not seen. It may be prudent to obtain abdominal radiograph to better evaluate nasogastric to location. Lungs clear.  Heart size normal.  No evident adenopathy. Electronically Signed   By:  Lowella Grip III M.D.   On: 06/12/2018 17:58      Subjective: Pt tolerating diet and having bowel movements.  No fever.  No emesis. Pt having some abdominal cramping at times.  Overall the abdominal pain is better.    Discharge Exam: Vitals:   06/14/18 2123 06/15/18 0546  BP: 130/82 121/78  Pulse: 77 71  Resp: 16 16  Temp: 98.1 F (36.7 C) 98.6 F (37 C)  SpO2: 98% 97%   Vitals:   06/14/18 1931 06/14/18 2123 06/15/18 0546 06/15/18 0600  BP:  130/82 121/78   Pulse:  77 71   Resp:  16 16   Temp:  98.1 F (36.7 C) 98.6 F (37 C)   TempSrc:  Oral Oral   SpO2: 98% 98% 97%   Weight:    81.3 kg  Height:        General exam: Awake, alert, no apparent distress, cooperative and pleasant. Respiratory system: Clear. No increased work of breathing. Cardiovascular system: S1 & S2 heard. No JVD, murmurs, gallops, clicks or pedal edema. Gastrointestinal system: Abdomen is mildly distended, soft and minimal left lower quadrant tenderness. Much improved from prior exams.  Normal bowel sounds heard. Central nervous system: Alert and oriented. No focal neurological deficits. Extremities: no CCE.   The results of significant diagnostics from this hospitalization (including imaging, microbiology, ancillary and laboratory) are listed below for reference.     Microbiology: Recent Results (from the past 240 hour(s))  SARS Coronavirus 2 (CEPHEID - Performed in [facility] hospital lab), Hosp Order     Status: None   Collection Time: 06/12/18  6:50 PM  Result Value Ref Range Status   SARS Coronavirus 2 NEGATIVE NEGATIVE Final    Comment: (NOTE) If result is NEGATIVE SARS-CoV-2 target nucleic acids are NOT DETECTED. The SARS-CoV-2 RNA is generally detectable in upper and lower  respiratory specimens during the acute phase of infection. The lowest  concentration of SARS-CoV-2 viral copies this assay can detect is 250  copies / mL. A negative result does not preclude SARS-CoV-2 infection   and should not be used as the sole basis for treatment or other  patient management decisions.  A negative result may occur with  improper specimen collection / handling, submission of specimen other  than nasopharyngeal swab, presence of viral mutation(s) within the  areas targeted by this assay, and inadequate number of viral copies  (<250 copies / mL). A negative result must be combined with clinical  observations, patient history, and epidemiological information. If result is POSITIVE SARS-CoV-2 target nucleic acids are DETECTED. The SARS-CoV-2 RNA is generally detectable in upper and lower  respiratory specimens dur ing the acute phase of infection.  Positive  results are indicative of active infection with SARS-CoV-2.  Clinical  correlation with patient history and other diagnostic information is  necessary to determine patient infection status.  Positive results do  not rule out bacterial infection or co-infection with other viruses. If result is PRESUMPTIVE POSTIVE SARS-CoV-2 nucleic acids MAY BE PRESENT.   A presumptive positive result was obtained on the submitted specimen  and confirmed on repeat testing.  While 2019 novel coronavirus  (SARS-CoV-2) nucleic acids may be present in the submitted sample  additional confirmatory testing may be necessary for epidemiological  and / or clinical management purposes  to differentiate between  SARS-CoV-2 and other Sarbecovirus currently known to infect humans.  If clinically indicated additional testing with an alternate test  methodology [phone]) is advised. The SARS-CoV-2 RNA is generally  detectable in upper and lower respiratory sp ecimens during the acute  phase of infection. The expected result is Negative. Fact Sheet for Patients:  StrictlyIdeas.no Fact Sheet for Healthcare Providers: BankingDealers.co.za  This test is not yet approved or cleared by the [place]  and has been authorized for detection and/or diagnosis of SARS-CoV-2 by FDA under an Emergency Use Authorization (EUA).  This EUA will remain in effect (meaning this test can be used) for the duration of the COVID-19 declaration under Section 564(b)(1) of the Act, 21 U.S.C. section 360bbb-3(b)(1), unless the authorization is terminated or revoked sooner. Performed at [facility], [address] [place], [place] [address]   C difficile quick scan w PCR reflex     Status: None   Collection Time: 06/13/18  6:20 AM  Result Value Ref Range Status   C Diff antigen NEGATIVE NEGATIVE Final   C Diff toxin NEGATIVE NEGATIVE Final   C Diff interpretation No C. difficile detected.  Final    Comment: Performed at [facility], [address] [place], [place] [address]     Labs: BNP (last 3 results) No results for input(s): BNP in the last 8760 hours. Basic Metabolic Panel: Recent Labs  Lab 06/12/18 1316 06/13/18 0630 06/14/18 0518 06/15/18 0458  NA 140 140 141 139  K 4.2 3.4* 3.4* 3.6  CL 103 102 103 106  CO2 26 27 27 24   GLUCOSE 98 122* 103* 99  BUN 8 6 6  <5*  CREATININE 0.69 0.66 0.56 0.49  CALCIUM 9.4 8.5* 8.5* 8.4*  MG  --   --  2.2 1.9   Liver Function Tests: Recent Labs  Lab 06/12/18 1316 06/14/18 0518 06/15/18 0458  AST 10* 9* 23  ALT 15 11 21   ALKPHOS 61 47 50  BILITOT 0.4 0.6 0.5  PROT 7.7 5.9* 5.9*  ALBUMIN 3.9 2.8* 2.9*   Recent Labs  Lab 06/12/18 1316  LIPASE 29   No results for input(s): AMMONIA in the last 168 hours. CBC: Recent Labs  Lab 06/12/18 1316 06/13/18 0630 06/14/18 0518 06/15/18 0458  WBC 14.5* 14.4* 10.2 8.6  NEUTROABS  --   --  7.5 5.6  HGB 15.4* 14.7 13.4 12.8  HCT 46.9* 43.8 39.4 38.2  MCV 94.7 93.8 93.6 93.2  PLT 451* 383 333 360   Cardiac Enzymes: No results for input(s): CKTOTAL, CKMB, CKMBINDEX, TROPONINI in the last 168 hours. BNP: Invalid input(s): POCBNP CBG: No results for input(s): GLUCAP in the last 168  hours. D-Dimer No results for input(s): DDIMER in the last 72 hours. Hgb A1c No results for input(s): HGBA1C in the last 72 hours. Lipid Profile No results for input(s): CHOL, HDL, LDLCALC, TRIG, CHOLHDL, LDLDIRECT in the last 72 hours. Thyroid function studies No results for input(s): TSH, T4TOTAL, T3FREE, THYROIDAB in the last 72 hours.  Invalid input(s): FREET3 Anemia work up No results for input(s): VITAMINB12, FOLATE, FERRITIN, TIBC, IRON, RETICCTPCT in the last 72 hours. Urinalysis    Component Value Date/Time   COLORURINE YELLOW 06/12/2018 1838   APPEARANCEUR CLOUDY (A) 06/12/2018 1838   LABSPEC >1.046 (H) 06/12/2018 1838   PHURINE 5.0 06/12/2018 1838   GLUCOSEU NEGATIVE 06/12/2018 1838   HGBUR NEGATIVE 06/12/2018 1838   BILIRUBINUR NEGATIVE 06/12/2018 1838   KETONESUR 20 (A) 06/12/2018 1838   PROTEINUR NEGATIVE 06/12/2018 1838   UROBILINOGEN 0.2 04/04/2014 0836   NITRITE NEGATIVE 06/12/2018 1838   LEUKOCYTESUR LARGE (A) 06/12/2018 1838   Sepsis Labs Invalid input(s): PROCALCITONIN,  WBC,  LACTICIDVEN Microbiology Recent Results (from the past 240 hour(s))  SARS Coronavirus 2 (CEPHEID - Performed in [place] hospital lab), Hosp Order     Status: None  Collection Time: 06/12/18  6:50 PM  Result Value Ref Range Status   SARS Coronavirus 2 NEGATIVE NEGATIVE Final    Comment: (NOTE) If result is NEGATIVE SARS-CoV-2 target nucleic acids are NOT DETECTED. The SARS-CoV-2 RNA is generally detectable in upper and lower  respiratory specimens during the acute phase of infection. The lowest  concentration of SARS-CoV-2 viral copies this assay can detect is 250  copies / mL. A negative result does not preclude SARS-CoV-2 infection  and should not be used as the sole basis for treatment or other  patient management decisions.  A negative result may occur with  improper specimen collection / handling, submission of specimen other  than nasopharyngeal swab, presence of  viral mutation(s) within the  areas targeted by this assay, and inadequate number of viral copies  (<250 copies / mL). A negative result must be combined with clinical  observations, patient history, and epidemiological information. If result is POSITIVE SARS-CoV-2 target nucleic acids are DETECTED. The SARS-CoV-2 RNA is generally detectable in upper and lower  respiratory specimens dur ing the acute phase of infection.  Positive  results are indicative of active infection with SARS-CoV-2.  Clinical  correlation with patient history and other diagnostic information is  necessary to determine patient infection status.  Positive results do  not rule out bacterial infection or co-infection with other viruses. If result is PRESUMPTIVE POSTIVE SARS-CoV-2 nucleic acids MAY BE PRESENT.   A presumptive positive result was obtained on the submitted specimen  and confirmed on repeat testing.  While 2019 novel coronavirus  (SARS-CoV-2) nucleic acids may be present in the submitted sample  additional confirmatory testing may be necessary for epidemiological  and / or clinical management purposes  to differentiate between  SARS-CoV-2 and other Sarbecovirus currently known to infect humans.  If clinically indicated additional testing with an alternate test  methodology [id number]) is advised. The SARS-CoV-2 RNA is generally  detectable in upper and lower respiratory sp ecimens during the acute  phase of infection. The expected result is Negative. Fact Sheet for Patients:  StrictlyIdeas.no Fact Sheet for Healthcare Providers: BankingDealers.co.za This test is not yet approved or cleared by the [place] FDA and has been authorized for detection and/or diagnosis of SARS-CoV-2 by FDA under an Emergency Use Authorization (EUA).  This EUA will remain in effect (meaning this test can be used) for the duration of the COVID-19 declaration under Section  564(b)(1) of the Act, 21 U.S.C. section 360bbb-3(b)(1), unless the authorization is terminated or revoked sooner. Performed at [facility], [address], [place], [place] [address]   C difficile quick scan w PCR reflex     Status: None   Collection Time: 06/13/18  6:20 AM  Result Value Ref Range Status   C Diff antigen NEGATIVE NEGATIVE Final   C Diff toxin NEGATIVE NEGATIVE Final   C Diff interpretation No C. difficile detected.  Final    Comment: Performed at [facility], [address], [place], [place] [address]    Time coordinating discharge: 35 minutes   SIGNED:  Irwin Brakeman, MD  [facility] 06/15/2018, 1:16 PM How to contact the [facility] Attending or Consulting provider [place] or covering provider during after hours [place], for this patient?  1. Check the care team in [facility] and look for a) attending/consulting [facility] provider listed and b) the [facility] team listed 2. Log into [email or web address] and use [place]'s universal password to access. If you do not have the password, please  contact the hospital operator. 3. Locate the [facility] provider you are looking for under [facility] and page to a number that you can be directly reached. 4. If you still have difficulty reaching the provider, please page the [facility] (Director on Call) for the Hospitalists listed on amion for assistance.

## 2018-06-15 NOTE — Discharge Instructions (Signed)
Please continue liquid diet for the next couple of days and then after that start a soft diet for about 5 days and then after that start a regular diet. PLEASE COMPLETE YOUR ANTIBIOTICS.  ALSO RECOMMEND TAKING OVER THE COUNTER PROBIOTICS FOR THE NEXT 2 WEEKS.    IMPORTANT INFORMATION: PAY CLOSE ATTENTION   PHYSICIAN DISCHARGE INSTRUCTIONS  Follow with Primary care provider  Radiontchenko, Mill[place]-Clicquot Sink, MD  and other consultants as instructed your Hospitalist Physician  SEEK MEDICAL CARE OR RETURN TO EMERGENCY ROOM IF SYMPTOMS COME BACK, WORSEN OR NEW PROBLEM DEVELOPS.   Please note: You were cared for by a hospitalist during your hospital stay. Every effort will be made to forward records to your primary care provider.  You can request that your primary care provider send for your hospital records if they have not received them.  Once you are discharged, your primary care physician will handle any further medical issues. Please note that NO REFILLS for any discharge medications will be authorized once you are discharged, as it is imperative that you return to your primary care physician (or establish a relationship with a primary care physician if you do not have one) for your post hospital discharge needs so that they can reassess your need for medications and monitor your lab values.  Please get a complete blood count and chemistry panel checked by your Primary MD at your next visit, and again as instructed by your Primary MD.  Get Medicines reviewed and adjusted: Please take all your medications with you for your next visit with your Primary MD  Laboratory/radiological data: Please request your Primary MD to go over all hospital tests and procedure/radiological results at the follow up, please ask your primary care provider to get all Hospital records sent to his/her office.  In some cases, they will be blood work, cultures and biopsy results pending at the time of your discharge. Please request  that your primary care provider follow up on these results.  If you are diabetic, please bring your blood sugar readings with you to your follow up appointment with primary care.    Please call and make your follow up appointments as soon as possible.    Also Note the following: If you experience worsening of your admission symptoms, develop shortness of breath, life threatening emergency, suicidal or homicidal thoughts you must seek medical attention immediately by calling 911 or calling your MD immediately  if symptoms less severe.  You must read complete instructions/literature along with all the possible adverse reactions/side effects for all the Medicines you take and that have been prescribed to you. Take any new Medicines after you have completely understood and accpet all the possible adverse reactions/side effects.   Do not drive when taking Pain medications or sleeping medications (Benzodiazepines)  Do not take more than prescribed Pain, Sleep and Anxiety Medications. It is not advisable to combine anxiety,sleep and pain medications without talking with your primary care practitioner  Special Instructions: If you have smoked or chewed Tobacco  in the last 2 yrs please stop smoking, stop any regular Alcohol  and or any Recreational drug use.  Wear Seat belts while driving.        Full Liquid Diet A full liquid diet refers to fluids and foods that are liquid or will become liquid at room temperature. This diet should only be used for a short period of time to help you recover from illness or surgery. Your health care provider or dietitian will help  you determine when it is safe to eat regular foods. What are tips for following this plan?     Reading food labels  Check food labels of nutrition shakes for the amount of protein. Look for nutrition shakes that have at least 8-10 grams of protein in each serving.  Look for drinks, such as milks and juices, that are "fortified"  or "enriched." This means that vitamins and minerals have been added. Shopping  Buy premade nutritional shakes to keep on hand.  To vary your choices, buy different flavors of milks and shakes. Meal planning  Choose flavors and foods that you enjoy.  To make sure you get enough energy from food (calories): ? Eat 3 full liquid meals each day. Have a liquid snack between each meal. ? Drink 6-8 ounces (177-237 ml) of a nutrition supplement shake with meals or as snacks. ? Add protein powder, powdered milk, milk, or yogurt to shakes to increase the amount of protein.  Drink at least one serving a day of citrus fruit juice or fruit juice that has vitamin C added. General guidelines  Before starting the full-liquid diet, check with your health care provider to know what foods you should avoid. These may include full-fat or high-fiber liquids.  You may have any liquid or food that becomes a liquid at room temperature. The food is considered a liquid if it can be poured off a spoon at room temperature.  Do not drink alcohol unless approved by your health care provider.  This diet gives you most of the nutrients that you need for energy, but you may not get enough of certain vitamins, minerals, and fiber. Make sure to talk to your health care provider or dietitian about: ? How many calories you need to eat get day. ? How much fluid you should have each day. ? Taking a multivitamin or a nutritional supplement. What foods are allowed? The items listed may not be a complete list. Talk with your dietitian about what dietary choices are best for you. Grains Thin hot cereal, such as cream of wheat. Soft-cooked pasta or rice pured in soup. Vegetables Pulp-free tomato or vegetable juice. Vegetables pured in soup. Fruits Fruit juice without pulp. Strained fruit pures (seeds and skins removed). Meats and other protein foods Beef, chicken, and fish broths. Powdered protein  supplements. Dairy Milk and milk-based beverages, including milk shakes and instant breakfast mixes. Smooth yogurt. Pured cottage cheese. Beverages Water. Coffee and tea (caffeinated or decaffeinated). Cocoa. Liquid nutritional supplements. Soft drinks. Nondairy milks, such as almond, coconut, rice, or soy milk. Fats and oils Melted margarine and butter. Cream. [place]ola, almond, avocado, corn, grapeseed, sunflower, and sesame oils. Gravy. Sweets and desserts Custard. Pudding. Flavored gelatin. Smooth ice cream (without nuts or candy pieces). Sherbet. Popsicles. [place] ice. Pudding pops. Seasoning and other foods Salt and pepper. Spices. Cocoa powder. Vinegar. Ketchup. Yellow mustard. Smooth sauces, such as [place], cheese sauce, or white sauce. Soy sauce. Cream soups. Strained soups. Syrup. Honey. Jelly (without fruit pieces). What foods are not allowed? The items listed may not be a complete list. Talk with your dietitian about what dietary choices are best for you. Grains Whole grains. Pasta. Rice. Cold cereal. Bread. Crackers. Vegetables All whole fresh, frozen, or canned vegetables. Fruits All whole fresh, frozen, or canned fruits. Meats and other protein foods All cuts of meat, poultry, and fish. Eggs. Tofu and soy protein. Nuts and nut butters. Lunch meat. Sausage. Dairy Hard cheese. Yogurt with fruit chunks. Fats and  oils Coconut oil. Palm oil. Lard. Cold butter. Sweets and desserts Ice cream or other frozen desserts that have any solids in them or on top, such as nuts, chocolate chips, and pieces of cookies. Cakes. Cookies. Candy. Seasoning and other foods Stone-ground mustards. Soups with chunks or pieces. Summary  A full liquid diet refers to fluids and foods that are liquid or will become liquid at room temperature.  This diet should only be used for a short period of time to help you recover from illness or surgery. Ask your health care provider or dietitian when it  is safe for you to eat regular foods.  To make sure you get enough calories and nutrients, eat 3 meals each day with snacks between. Drink premade nutrition supplement shakes or add protein powder to homemade shakes. Take a vitamin and mineral supplement as told by your health care provider. This information is not intended to replace advice given to you by your health care provider. Make sure you discuss any questions you have with your health care provider. Document Released: 01/03/2005 Document Revised: 02/17/2016 Document Reviewed: 02/17/2016 Elsevier Interactive Patient Education  2019 [place] A soft-food eating plan includes foods that are safe and easy to chew and swallow. Your health care provider or dietitian can help you find foods and flavors that fit into this plan. Follow this plan until your health care provider or dietitian says it is safe to start eating other foods and food textures. What are tips for following this plan? General guidelines   Take small bites of food, or cut food into pieces about  inch or smaller. Bite-sized pieces of food are easier to chew and swallow.  Eat moist foods. Avoid overly dry foods.  Avoid foods that: ? Are difficult to swallow, such as dry, chunky, crispy, or sticky foods. ? Are difficult to chew, such as hard, tough, or stringy foods. ? Contain nuts, seeds, or fruits.  Follow instructions from your dietitian about the types of liquids that are safe for you to swallow. You may be allowed to have: ? Thick liquids only. This includes only liquids that are thicker than honey. ? Thin and thick liquids. This includes all beverages and foods that become liquid at room temperature.  To make thick liquids: ? Purchase a commercial liquid thickening powder. These are available at grocery stores and pharmacies. ? Mix the thickener into liquids according to instructions on the label. ? Purchase ready-made thickened  liquids. ? Thicken soup by pureeing, straining to remove chunks, and adding flour, potato flakes, or corn starch. ? Add commercial thickener to foods that become liquid at room temperature, such as milk shakes, yogurt, ice cream, gelatin, and sherbet.  Ask your health care provider whether you need to take a fiber supplement. Cooking  Cook meats so they stay tender and moist. Use methods like braising, stewing, or baking in liquid.  Cook vegetables and fruit until they are soft enough to be mashed with a fork.  Peel soft, fresh fruits such as peaches, nectarines, and melons.  When making soup, make sure chunks of meat and vegetables are smaller than  inch.  Reheat leftover foods slowly so that a tough crust does not form. What foods are allowed? The items listed below may not be a complete list. Talk with your dietitian about what dietary choices are best for you. Grains Breads, muffins, pancakes, or waffles moistened with syrup, jelly, or butter. Dry cereals well-moistened with  milk. Moist, cooked cereals. Well-cooked pasta and rice. Vegetables All soft-cooked vegetables. Shredded lettuce. Fruits All canned and cooked fruits. Soft, peeled fresh fruits. Strawberries. Dairy Milk. Cream. Yogurt. Cottage cheese. Soft cheese without the rind. Meats and other protein foods Tender, moist ground meat, poultry, or fish. Meat cooked in gravy or sauces. Eggs. Sweets and desserts Ice cream. Milk shakes. Sherbet. Pudding. Fats and oils Butter. Margarine. Olive, canola, sunflower, and grapeseed oil. Smooth salad dressing. Smooth cream cheese. Mayonnaise. Gravy. What foods are not allowed? The items listed bemay not be a complete list. Talk with your dietitian about what dietary choices are best for you. Grains Coarse or dry cereals, such as bran, granola, and shredded wheat. Tough or chewy crusty breads, such as [place] bread or baguettes. Breads with nuts, seeds, or fruit. Vegetables All raw  vegetables. Cooked corn. Cooked vegetables that are tough or stringy. Tough, crisp, fried potatoes and potato skins. Fruits Fresh fruits with skins or seeds, or both, such as apples, pears, and grapes. Stringy, high-pulp fruits, such as papaya, pineapple, coconut, and mango. Fruit leather and all dried fruit. Dairy Yogurt with nuts or coconut. Meats and other protein foods Hard, dry sausages. Dry meat, poultry, or fish. Meats with gristle. Fish with bones. Fried meat or fish. Lunch meat and hotdogs. Nuts and seeds. Chunky peanut butter or other nut butters. Sweets and desserts Cakes or cookies that are very dry or chewy. Desserts with dried fruit, nuts, or coconut. Fried pastries. Very rich pastries. Fats and oils Cream cheese with fruit or nuts. Salad dressings with seeds or chunks. Summary  A soft-food eating plan includes foods that are safe and easy to swallow. Generally, the foods should be soft enough to be mashed with a fork.  Avoid foods that are dry, hard to chew, crunchy, sticky, stringy, or crispy.  Ask your health care provider whether you need to thicken your liquids and if you need to take a fiber supplement. This information is not intended to replace advice given to you by your health care provider. Make sure you discuss any questions you have with your health care provider. Document Released: 04/12/2007 Document Revised: 03/08/2016 Document Reviewed: 03/08/2016 Elsevier Interactive Patient Education  2019 [place].     Abdominal Bloating When you have abdominal bloating, your abdomen may feel full, tight, or painful. It may also look bigger than normal or swollen (distended). Common causes of abdominal bloating include:  Swallowing air.  Constipation.  Problems digesting food.  Eating too much.  Irritable bowel syndrome. This is a condition that affects the large intestine.  Lactose intolerance. This is an inability to digest lactose, a natural sugar in  dairy products.  Celiac disease. This is a condition that affects the ability to digest gluten, a protein found in some grains.  Gastroparesis. This is a condition that slows down the movement of food in the stomach and small intestine. It is more common in people with diabetes mellitus.  Gastroesophageal reflux disease (GERD). This is a digestive condition that makes stomach acid flow back into the esophagus.  Urinary retention. This means that the body is holding onto urine, and the bladder cannot be emptied all the way. Follow these instructions at home: Eating and drinking  Avoid eating too much.  Try not to swallow air while talking or eating.  Avoid eating while lying down.  Avoid these foods and drinks: ? Foods that cause gas, such as broccoli, cabbage, cauliflower, and baked beans. ? Carbonated drinks. ?  Hard candy. ? Chewing gum. Medicines  Take over-the-counter and prescription medicines only as told by your health care provider.  Take probiotic medicines. These medicines contain live bacteria or yeasts that can help digestion.  Take coated peppermint oil capsules. Activity  Try to exercise regularly. Exercise may help to relieve bloating that is caused by gas and relieve constipation. General instructions  Keep all follow-up visits as told by your health care provider. This is important. Contact a health care provider if:  You have nausea and vomiting.  You have diarrhea.  You have abdominal pain.  You have unusual weight loss or weight gain.  You have severe pain, and medicines do not help. Get help right away if:  You have severe chest pain.  You have trouble breathing.  You have shortness of breath.  You have trouble urinating.  You have darker urine than normal.  You have blood in your stools or have dark, tarry stools. Summary  Abdominal bloating means that the abdomen is swollen.  Common causes of abdominal bloating are swallowing air,  constipation, and problems digesting food.  Avoid eating too much and avoid swallowing air.  Avoid foods that cause gas, carbonated drinks, hard candy, and chewing gum. This information is not intended to replace advice given to you by your health care provider. Make sure you discuss any questions you have with your health care provider. Document Released: 02/05/2016 Document Revised: 02/05/2016 Document Reviewed: 02/05/2016 Elsevier Interactive Patient Education  2019 [place].   Abdominal Pain, Adult  Many things can cause belly (abdominal) pain. Most times, belly pain is not dangerous. Many cases of belly pain can be watched and treated at home. Sometimes belly pain is serious, though. Your doctor will try to find the cause of your belly pain. Follow these instructions at home:  Take over-the-counter and prescription medicines only as told by your doctor. Do not take medicines that help you poop (laxatives) unless told to by your doctor.  Drink enough fluid to keep your pee (urine) clear or pale yellow.  Watch your belly pain for any changes.  Keep all follow-up visits as told by your doctor. This is important. Contact a doctor if:  Your belly pain changes or gets worse.  You are not hungry, or you lose weight without trying.  You are having trouble pooping (constipated) or have watery poop (diarrhea) for more than 2-3 days.  You have pain when you pee or poop.  Your belly pain wakes you up at night.  Your pain gets worse with meals, after eating, or with certain foods.  You are throwing up and cannot keep anything down.  You have a fever. Get help right away if:  Your pain does not go away as soon as your doctor says it should.  You cannot stop throwing up.  Your pain is only in areas of your belly, such as the right side or the left lower part of the belly.  You have bloody or black poop, or poop that looks like tar.  You have very bad pain, cramping, or  bloating in your belly.  You have signs of not having enough fluid or water in your body (dehydration), such as: ? Dark pee, very little pee, or no pee. ? Cracked lips. ? Dry mouth. ? Sunken eyes. ? Sleepiness. ? Weakness. This information is not intended to replace advice given to you by your health care provider. Make sure you discuss any questions you have with your health  care provider. Document Released: 06/22/2007 Document Revised: 07/24/2015 Document Reviewed: 06/17/2015 Elsevier Interactive Patient Education  2019 [employer].

## 2018-06-15 NOTE — Progress Notes (Signed)
Stated has had several loose bms today.  Has had some of full liquid trays and is not complaining of nausea but still has pain.  Contacted Dr. Wynetta Emery with this information and he will do discharge

## 2018-06-15 NOTE — Progress Notes (Signed)
  Subjective: Patient states she is more bloated, but is starting to pass gas.  No nausea or vomiting have been noted.  She is requesting discharge.  Objective: Vital signs in last 24 hours: Temp:  [98.1 F (36.7 C)-98.6 F (37 C)] 98.6 F (37 C) (05/29 0546) Pulse Rate:  [71-83] 71 (05/29 0546) Resp:  [16] 16 (05/29 0546) BP: (113-130)/(70-82) 121/78 (05/29 0546) SpO2:  [97 %-100 %] 97 % (05/29 0546) Weight:  [81.3 kg] 81.3 kg (05/29 0600) Last BM Date: 06/13/18  Intake/Output from previous day: 05/28 0701 - 05/29 0700 In: 2719.6 [P.O.:960; I.V.:825.5; IV Piggyback:934.1] Out: -  Intake/Output this shift: No intake/output data recorded.  General appearance: alert, cooperative and no distress GI: Soft, mildly distended.  Bowel sounds present.  No rigidity is noted.  Nonspecific diffuse abdominal pain.  No rigidity is noted.  Lab Results:  Recent Labs    06/14/18 0518 06/15/18 0458  WBC 10.2 8.6  HGB 13.4 12.8  HCT 39.4 38.2  PLT 333 360   BMET Recent Labs    06/14/18 0518 06/15/18 0458  NA 141 139  K 3.4* 3.6  CL 103 106  CO2 27 24  GLUCOSE 103* 99  BUN 6 <5*  CREATININE 0.56 0.49  CALCIUM 8.5* 8.4*   PT/INR No results for input(s): LABPROT, INR in the last 72 hours.  Studies/Results: No results found.  Anti-infectives: Anti-infectives (From admission, onward)   Start     Dose/Rate Route Frequency Ordered Stop   06/12/18 1845  ciprofloxacin (CIPRO) IVPB 400 mg     400 mg 200 mL/hr over 60 Minutes Intravenous Every 12 hours 06/12/18 1838     06/12/18 1845  metroNIDAZOLE (FLAGYL) IVPB 500 mg     500 mg 100 mL/hr over 60 Minutes Intravenous Every 8 hours 06/12/18 1838        Assessment/Plan: Impression: Enterocolitis secondary to ruptured ovarian cyst.  Doubt mechanical bowel obstruction. Plan: Advance to full liquid diet.  Patient may be discharged once her pain and bloating are under control.  Patient was instructed to try to ambulate as much as  possible.  Awaiting full return of bowel function.  LOS: 2 days    Aviva Signs 06/15/2018

## 2019-07-05 DIAGNOSIS — J45909 Unspecified asthma, uncomplicated: Secondary | ICD-10-CM | POA: Diagnosis not present

## 2019-07-05 DIAGNOSIS — I10 Essential (primary) hypertension: Secondary | ICD-10-CM | POA: Insufficient documentation

## 2019-07-05 DIAGNOSIS — F17210 Nicotine dependence, cigarettes, uncomplicated: Secondary | ICD-10-CM | POA: Diagnosis not present

## 2019-07-05 DIAGNOSIS — Z79899 Other long term (current) drug therapy: Secondary | ICD-10-CM | POA: Insufficient documentation

## 2019-07-05 DIAGNOSIS — F1994 Other psychoactive substance use, unspecified with psychoactive substance-induced mood disorder: Secondary | ICD-10-CM | POA: Insufficient documentation

## 2019-07-05 DIAGNOSIS — F22 Delusional disorders: Secondary | ICD-10-CM | POA: Diagnosis not present

## 2019-07-05 DIAGNOSIS — Z20822 Contact with and (suspected) exposure to covid-19: Secondary | ICD-10-CM | POA: Insufficient documentation

## 2019-07-05 DIAGNOSIS — E876 Hypokalemia: Secondary | ICD-10-CM | POA: Insufficient documentation

## 2019-07-05 DIAGNOSIS — R451 Restlessness and agitation: Secondary | ICD-10-CM | POA: Diagnosis present

## 2019-07-05 DIAGNOSIS — F310 Bipolar disorder, current episode hypomanic: Secondary | ICD-10-CM | POA: Diagnosis not present

## 2019-07-06 ENCOUNTER — Encounter (HOSPITAL_COMMUNITY): Payer: Self-pay

## 2019-07-06 ENCOUNTER — Emergency Department (HOSPITAL_COMMUNITY)
Admission: EM | Admit: 2019-07-06 | Discharge: 2019-07-07 | Disposition: A | Discharge status: Home / Self Care | Payer: Medicare Other | Attending: Emergency Medicine | Admitting: Emergency Medicine

## 2019-07-06 ENCOUNTER — Other Ambulatory Visit: Payer: Self-pay

## 2019-07-06 DIAGNOSIS — R4689 Other symptoms and signs involving appearance and behavior: Secondary | ICD-10-CM

## 2019-07-06 DIAGNOSIS — F1410 Cocaine abuse, uncomplicated: Secondary | ICD-10-CM | POA: Diagnosis present

## 2019-07-06 DIAGNOSIS — E876 Hypokalemia: Secondary | ICD-10-CM

## 2019-07-06 DIAGNOSIS — F3110 Bipolar disorder, current episode manic without psychotic features, unspecified: Secondary | ICD-10-CM

## 2019-07-06 DIAGNOSIS — F1994 Other psychoactive substance use, unspecified with psychoactive substance-induced mood disorder: Secondary | ICD-10-CM | POA: Diagnosis not present

## 2019-07-06 DIAGNOSIS — F310 Bipolar disorder, current episode hypomanic: Secondary | ICD-10-CM | POA: Diagnosis present

## 2019-07-06 LAB — COMPREHENSIVE METABOLIC PANEL
ALT: 23 U/L (ref 0–44)
AST: 23 U/L (ref 15–41)
Albumin: 4.7 g/dL (ref 3.5–5.0)
Alkaline Phosphatase: 55 U/L (ref 38–126)
Anion gap: 9 (ref 5–15)
BUN: 12 mg/dL (ref 6–20)
CO2: 23 mmol/L (ref 22–32)
Calcium: 9.1 mg/dL (ref 8.9–10.3)
Chloride: 106 mmol/L (ref 98–111)
Creatinine, Ser: 0.71 mg/dL (ref 0.44–1.00)
GFR calc Af Amer: >60 mL/min (ref >60)
GFR calc non Af Amer: >60 mL/min (ref >60)
Glucose, Bld: 112 mg/dL — ABNORMAL HIGH (ref 70–99)
Potassium: 2.9 mmol/L — ABNORMAL LOW (ref 3.5–5.1)
Sodium: 138 mmol/L (ref 135–145)
Total Bilirubin: 0.8 mg/dL (ref 0.3–1.2)
Total Protein: 7.5 g/dL (ref 6.5–8.1)

## 2019-07-06 LAB — I-STAT BETA HCG BLOOD, ED (MC, WL, AP ONLY): I-stat hCG, quantitative: <5 m[IU]/mL (ref <5)

## 2019-07-06 LAB — CBC
HCT: 37.9 % (ref 36.0–46.0)
Hemoglobin: 13 g/dL (ref 12.0–15.0)
MCH: 29.9 pg (ref 26.0–34.0)
MCHC: 34.3 g/dL (ref 30.0–36.0)
MCV: 87.1 fL (ref 80.0–100.0)
Platelets: 355 10*3/uL (ref 150–400)
RBC: 4.35 MIL/uL (ref 3.87–5.11)
RDW: 13.5 % (ref 11.5–15.5)
WBC: 10.4 10*3/uL (ref 4.0–10.5)
nRBC: 0 % (ref 0.0–0.2)

## 2019-07-06 LAB — SALICYLATE LEVEL: Salicylate Lvl: <7 mg/dL — ABNORMAL LOW (ref 7.0–30.0)

## 2019-07-06 LAB — ACETAMINOPHEN LEVEL: Acetaminophen (Tylenol), Serum: <10 ug/mL — ABNORMAL LOW (ref 10–30)

## 2019-07-06 LAB — RAPID URINE DRUG SCREEN, HOSP PERFORMED
Amphetamines: POSITIVE — AB
Barbiturates: NOT DETECTED
Benzodiazepines: POSITIVE — AB
Cocaine: POSITIVE — AB
Opiates: NOT DETECTED
Tetrahydrocannabinol: POSITIVE — AB

## 2019-07-06 LAB — ETHANOL: Alcohol, Ethyl (B): <10 mg/dL (ref <10)

## 2019-07-06 LAB — SARS CORONAVIRUS 2 BY RT PCR (HOSPITAL ORDER, PERFORMED IN ~~LOC~~ HOSPITAL LAB): SARS Coronavirus 2: NEGATIVE

## 2019-07-06 MED ORDER — STERILE WATER FOR INJECTION IJ SOLN
INTRAMUSCULAR | Status: AC
  Administered 2019-07-06: 1.2 mL
  Filled 2019-07-06: qty 10

## 2019-07-06 MED ORDER — AMPHETAMINE-DEXTROAMPHET ER 5 MG PO CP24
25.0000 mg | ORAL_CAPSULE | Freq: Every morning | ORAL | Status: DC
  Filled 2019-07-06: qty 5

## 2019-07-06 MED ORDER — CARBAMAZEPINE ER 200 MG PO CP12
300.0000 mg | ORAL_CAPSULE | Freq: Every day | ORAL | Status: DC
  Filled 2019-07-06: qty 1

## 2019-07-06 MED ORDER — METOPROLOL SUCCINATE ER 50 MG PO TB24
25.0000 mg | ORAL_TABLET | Freq: Every evening | ORAL | Status: DC
  Administered 2019-07-06: 25 mg via ORAL
  Filled 2019-07-06: qty 1

## 2019-07-06 MED ORDER — FLUOXETINE HCL 20 MG PO CAPS
60.0000 mg | ORAL_CAPSULE | Freq: Every morning | ORAL | Status: DC
  Administered 2019-07-07: 60 mg via ORAL
  Filled 2019-07-06: qty 3

## 2019-07-06 MED ORDER — CARIPRAZINE HCL 1.5 MG PO CAPS
6.0000 mg | ORAL_CAPSULE | Freq: Every day | ORAL | Status: DC
  Administered 2019-07-07: 6 mg via ORAL
  Filled 2019-07-06: qty 4

## 2019-07-06 MED ORDER — POLYETHYLENE GLYCOL 3350 17 G PO PACK
17.0000 g | PACK | Freq: Every day | ORAL | Status: DC | PRN
  Filled 2019-07-06: qty 1

## 2019-07-06 MED ORDER — MONTELUKAST SODIUM 10 MG PO TABS
10.0000 mg | ORAL_TABLET | Freq: Every day | ORAL | Status: DC
  Administered 2019-07-06: 10 mg via ORAL
  Filled 2019-07-06: qty 1

## 2019-07-06 MED ORDER — CLONAZEPAM 1 MG PO TBDP: 1.0000 mg | ORAL_TABLET | Freq: Three times a day (TID) | ORAL | Status: DC | PRN

## 2019-07-06 MED ORDER — CARBAMAZEPINE ER 200 MG PO CP12
900.0000 mg | ORAL_CAPSULE | Freq: Every day | ORAL | Status: DC
  Administered 2019-07-07: 900 mg via ORAL
  Filled 2019-07-06: qty 1

## 2019-07-06 MED ORDER — TIZANIDINE HCL 4 MG PO TABS
8.0000 mg | ORAL_TABLET | Freq: Every day | ORAL | Status: DC
  Administered 2019-07-07: 8 mg via ORAL
  Filled 2019-07-06: qty 2

## 2019-07-06 MED ORDER — OLANZAPINE 10 MG IM SOLR: 10.0000 mg | Freq: Two times a day (BID) | INTRAMUSCULAR | Status: DC

## 2019-07-06 MED ORDER — OLANZAPINE 10 MG PO TABS
10.0000 mg | ORAL_TABLET | Freq: Two times a day (BID) | ORAL | Status: DC
  Administered 2019-07-06 – 2019-07-07 (×3): 10 mg via ORAL
  Filled 2019-07-06 (×3): qty 1

## 2019-07-06 MED ORDER — ZIPRASIDONE MESYLATE 20 MG IM SOLR
20.0000 mg | Freq: Once | INTRAMUSCULAR | Status: AC
  Administered 2019-07-06: 20 mg via INTRAMUSCULAR
  Filled 2019-07-06: qty 20

## 2019-07-06 MED ORDER — POTASSIUM CHLORIDE CRYS ER 20 MEQ PO TBCR
40.0000 meq | EXTENDED_RELEASE_TABLET | Freq: Two times a day (BID) | ORAL | Status: DC
  Administered 2019-07-06 – 2019-07-07 (×3): 40 meq via ORAL
  Filled 2019-07-06 (×3): qty 2

## 2019-07-06 MED ORDER — ALBUTEROL SULFATE HFA 108 (90 BASE) MCG/ACT IN AERS: 2.0000 | INHALATION_SPRAY | RESPIRATORY_TRACT | Status: DC | PRN

## 2019-07-06 MED ORDER — OLOPATADINE HCL 0.1 % OP SOLN
1.0000 [drp] | Freq: Two times a day (BID) | OPHTHALMIC | Status: DC | PRN
  Filled 2019-07-06: qty 5

## 2019-07-06 MED ORDER — FLUTICASONE PROPIONATE HFA 110 MCG/ACT IN AERO: 2.0000 | INHALATION_SPRAY | Freq: Two times a day (BID) | RESPIRATORY_TRACT | Status: DC

## 2019-07-06 MED ORDER — AMPHETAMINE-DEXTROAMPHETAMINE 10 MG PO TABS
20.0000 mg | ORAL_TABLET | Freq: Every day | ORAL | Status: DC
  Filled 2019-07-06: qty 2

## 2019-07-06 MED ORDER — DIPHENHYDRAMINE HCL 50 MG/ML IJ SOLN: 50.0000 mg | Freq: Two times a day (BID) | INTRAMUSCULAR | Status: DC

## 2019-07-06 MED ORDER — BUDESONIDE 0.5 MG/2ML IN SUSP
0.5000 mg | Freq: Two times a day (BID) | RESPIRATORY_TRACT | Status: DC
  Filled 2019-07-06 (×2): qty 2

## 2019-07-06 MED ORDER — CARBAMAZEPINE ER 300 MG PO CP12: 300.0000 mg | ORAL_CAPSULE | ORAL | Status: DC

## 2019-07-06 MED ORDER — PROMETHAZINE HCL 25 MG PO TABS: 25.0000 mg | ORAL_TABLET | Freq: Four times a day (QID) | ORAL | Status: DC | PRN

## 2019-07-06 MED ORDER — DIPHENHYDRAMINE HCL 25 MG PO CAPS
50.0000 mg | ORAL_CAPSULE | Freq: Two times a day (BID) | ORAL | Status: DC
  Administered 2019-07-06: 50 mg via ORAL
  Filled 2019-07-06 (×3): qty 2

## 2019-07-06 MED ORDER — TEMAZEPAM 15 MG PO CAPS
60.0000 mg | ORAL_CAPSULE | Freq: Every day | ORAL | Status: DC
  Administered 2019-07-07: 60 mg via ORAL
  Filled 2019-07-06: qty 4

## 2019-07-06 NOTE — ED Notes (Signed)
Pt makes strange requests, like asking that a police officer and only a police officer pull her face mask up and she will not pull it up herself.  Allowed this Probation officer to do interventions after ascertaining that this Probation officer is an Development worker, international aid".  Took potassium dissolved in apple sauce and appeared to understand the necessity of it. Pt asked for sanitary napkins and went to the bathroom on her own to take care of her hygiene needs.

## 2019-07-06 NOTE — ED Notes (Signed)
BELONGINGS MOVED TO LOCKER 36. PT MOVED TO ACUTE UNIT.
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
## 2019-07-06 NOTE — ED Notes (Signed)
Pt has soiled her clothes and bed with menstrual blood. This Probation officer supplied shower supplies including adult diapers because she complained that the maternity pads are not containing her.  Instead of attending to her hygiene she just went back to bed.

## 2019-07-06 NOTE — ED Notes (Signed)
Patient is alert, oriented and cooperative with this RN. Patient reports she just felt overwhelmed earlier today and like no one cared. Patient reports she uses tampons due to her heavy menstrual flow and that the pads do not stick to the mesh panties she was given. Patient reports she would like to shower. RN showed patient where the shower is. Patient requested to make a phone call. Patient shown where the phone was and how to use it. Patient reports she feels better after resting and would like to go home if the doctor's let her.
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
## 2019-07-06 NOTE — BH Assessment (Signed)
Pt received Geodon and is unable to participate in completing a CCA at this time, per [facility].

## 2019-07-06 NOTE — ED Notes (Signed)
Patient reports she is missing her 6 night time medications (Klonopin, Vraylar, Moss Mc, Prozac, Restoril, and Zanaflex), MD Kirby Funk and made aware

## 2019-07-06 NOTE — ED Notes (Signed)
Patient now becoming agitated speaking on the phone. Patient is cursing at person on phone and using exaggerated body and head movements. Patient seems angry and upset. Patient is screaming and crying saying she needs to call her husband back because she just screamed she hated him. RN told her the phones turn off at 10pm and she became agitated and erratic fixating on calling her significant other back.
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
## 2019-07-06 NOTE — BH Assessment (Signed)
[facility] Assessment Progress Note  Case was staffed with Reita Cliche DNP who recommended patient be observed and monitored for 24 hours.

## 2019-07-07 DIAGNOSIS — F310 Bipolar disorder, current episode hypomanic: Secondary | ICD-10-CM | POA: Diagnosis present

## 2019-07-07 DIAGNOSIS — F1994 Other psychoactive substance use, unspecified with psychoactive substance-induced mood disorder: Secondary | ICD-10-CM | POA: Diagnosis not present

## 2019-07-07 DIAGNOSIS — F1410 Cocaine abuse, uncomplicated: Secondary | ICD-10-CM | POA: Diagnosis present

## 2019-07-07 MED ORDER — OLANZAPINE 10 MG PO TABS: 10.0000 mg | ORAL_TABLET | Freq: Two times a day (BID) | ORAL | 0 refills | Status: DC

## 2019-07-07 NOTE — ED Notes (Signed)
Pt requesting her eyeglasses. No glasses are in her pt belonging bag in locker 36.  Pt advised of same.
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
## 2019-07-07 NOTE — ED Notes (Signed)
Pt DC off unit to home per provider. Pt alert, calm, cooperative, no s/s of distress. DC information given to and reviewed with pt, pt acknowledged by pt. Belongings given to pt. Pt ambulatory off unit, escorted by RN. Pt transported by family.

## 2019-07-07 NOTE — Discharge Instructions (Signed)
Managing Bipolar Disorder When someone is diagnosed with bipolar disorder, the person may be relieved to now know why he or she has felt or behaved a certain way. The person may also feel overwhelmed about the treatment ahead, how to get needed support, and how to deal with the condition each day. With care and support, a person with bipolar disorder can learn to manage his or her symptoms and live with the condition. How to manage lifestyle changes Managing stress Stress is your body's reaction to life changes and events, both good and bad. Stress can play a major role in bipolar disorder, so it is important to learn how to manage stress. Some techniques to help you manage stress include:  Meditation, muscle relaxation, and breathing exercises.  Exercise. Even a short daily walk can help to lower stress levels.  Getting enough good-quality sleep. Too little sleep can cause mania to start (can trigger mania).  Making a schedule to manage your time. Knowing your daily schedule can help to keep you from feeling overwhelmed by tasks and deadlines.  Spending time on hobbies you enjoy.  Medicines Your health care provider may suggest certain medicines if he or she feels that they will help improve your condition. Avoid using caffeine, alcohol, and other substances that may prevent your medicines from working properly. It is also important to:  Talk with your pharmacist or health care provider about all the medicines that you take, their possible side effects, and which medicines are safe to take together.  Make it your goal to take part in all treatment decisions (shared decision-making). Ask about possible side effects of medicines that your health care provider recommends, and tell him or her how you feel about having those side effects. It is best if shared decision-making with your health care provider is part of your total treatment plan. If you are taking medicines as part of your treatment,  do not stop taking medicines before you ask your health care provider if it is safe to stop. You may need to have the medicine slowly decreased (tapered) over time to lower the risk of harmful side effects. Relationships Spend time with people whom you trust and with whom you feel a sense of understanding and calm. Try to find friends or family members who make you feel safe and can help you control feelings of mania. Consider going to couples counseling, family education classes, or family therapy to:  Educate your loved ones about your condition and offer suggestions about how they can support you.  Help resolve conflicts.  Help develop communication skills in your relationships.  How to recognize changes in your condition Everyone responds differently to treatment for bipolar disorder. Some signs that your condition is improving include:  Leveling of your mood. You may have less anger and excitement about daily activities, and your low moods may not be as bad.  Your symptoms being less intense.  Feeling calm more often.  Thinking clearly.  Not experiencing consequences for extreme behavior.  Feeling like your life is settling down.  Your behavior seeming more normal to you and to other people. Some signs that your condition may be getting worse include:  Sleep problems.  Moods cycling between deep lows and unusually high (excess) energy.  Extreme emotions.  More anger at loved ones.  Staying away from others, or isolating yourself.  A feeling of power or superiority.  Completing a lot of tasks in a very short amount of time.  Unusual thoughts  and behaviors.  Suicidal thoughts. Follow these instructions at home: Medicines  Take over-the-counter and prescription medicines only as told by your health care provider or pharmacist.  Ask your pharmacist what over-the-counter cold medicines you should avoid. Some medicines can make symptoms worse. General  instructions  Ask for support from trusted family members or friends to make sure you stay on track with your treatment.  Keep a journal to write down your daily moods, medicines, sleep habits, and life events. This may help you have more success with your treatment.  Make and follow a routine for daily meal times. Eat healthy foods, such as whole grains, vegetables, and fresh fruit.  Try to go to sleep and wake up around the same time every day.  Keep all follow-up visits as told by your health care provider. This is important. Where to find support Talking to others  Try making a list of the people you may want to tell about your condition, such as the people you trust most.  Plan what you are willing and not willing to talk about. Think about your needs ahead of time, and how your friends and family members can support you.  Let your loved ones know when they can share advice and when you would just like them to listen.  Give your loved ones information about bipolar disorder, and encourage them to learn about the condition. Finances Not all insurance plans cover mental health care, so it is important to check with your insurance carrier. If paying for co-pays or counseling services is a problem, search for a local or county mental health care center. Public mental health care services may be offered there at a low cost or no cost when you are not able to see a private health care provider. If you are taking medicine for depression, you may be able to get the generic form, which may be less expensive than brand-name medicine. Some makers of prescription medicines also offer help to patients who cannot afford the medicines they need. Questions to ask your health care provider:  If you are taking medicines: ? How long do I need to take medicine? ? Are there any long-term side effects of my medicine? ? Are there any alternatives to taking medicine?  How would I benefit from  therapy?  How often should I follow up with a health care provider? Contact a health care provider if:  Your symptoms get worse or they do not get better with treatment. Get help right away if:  You have thoughts about harming yourself or others. If you ever feel like you may hurt yourself or others, or have thoughts about taking your own life, get help right away. You can go to your nearest emergency department or call:  Your local emergency services (911 in the [place]  A suicide crisis helpline, such as the [place] at 762-557-2487. This is open 24-hours a day. Summary  Learning ways to manage stress can help to calm you and may also help your treatment work better.  There is a wide range of medicines that can help to treat bipolar disorder.  Having healthy relationships can help to make your moods more stable.  Contact a health care provider if your symptoms get worse or they do not get better with treatment. This information is not intended to replace advice given to you by your health care provider. Make sure you discuss any questions you have with your health care provider. Document Revised:  04/27/2018 Document Reviewed: 05/05/2016 Elsevier Patient Education  [place].

## 2019-08-12 ENCOUNTER — Other Ambulatory Visit (HOSPITAL_COMMUNITY): Payer: Self-pay | Admitting: Psychiatry

## 2019-09-16 MED FILL — Potassium Chloride Microencapsulated Crys ER Tab 20 mEq: ORAL | Qty: 40 | Status: AC

## 2019-09-16 MED FILL — Olanzapine Tab 10 MG: ORAL | Qty: 10 | Status: AC

## 2019-09-16 MED FILL — Diphenhydramine HCl Cap 25 MG: ORAL | Qty: 50 | Status: AC

## 2019-09-16 MED FILL — Cariprazine HCl Cap 1.5 MG (Base Equivalent): ORAL | Qty: 6 | Status: AC

## 2019-09-16 MED FILL — Tizanidine HCl Tab 4 MG (Base Equivalent): ORAL | Qty: 8 | Status: AC

## 2019-09-16 MED FILL — Metoprolol Succinate Tab ER 24HR 50 MG (Tartrate Equiv): ORAL | Qty: 25 | Status: AC

## 2019-09-16 MED FILL — Temazepam Cap 15 MG: ORAL | Qty: 60 | Status: AC

## 2019-11-06 ENCOUNTER — Other Ambulatory Visit: Payer: Self-pay

## 2019-11-06 ENCOUNTER — Emergency Department (HOSPITAL_COMMUNITY): Payer: Medicare Other

## 2019-11-06 ENCOUNTER — Emergency Department (HOSPITAL_COMMUNITY)
Admission: EM | Admit: 2019-11-06 | Discharge: 2019-11-06 | Disposition: A | Discharge status: Home / Self Care | Payer: Medicare Other | Attending: Emergency Medicine | Admitting: Emergency Medicine

## 2019-11-06 ENCOUNTER — Encounter (HOSPITAL_COMMUNITY): Payer: Self-pay | Admitting: Emergency Medicine

## 2019-11-06 DIAGNOSIS — S0003XA Contusion of scalp, initial encounter: Secondary | ICD-10-CM | POA: Diagnosis not present

## 2019-11-06 DIAGNOSIS — Z79899 Other long term (current) drug therapy: Secondary | ICD-10-CM | POA: Insufficient documentation

## 2019-11-06 DIAGNOSIS — J45909 Unspecified asthma, uncomplicated: Secondary | ICD-10-CM | POA: Insufficient documentation

## 2019-11-06 DIAGNOSIS — I10 Essential (primary) hypertension: Secondary | ICD-10-CM | POA: Diagnosis not present

## 2019-11-06 DIAGNOSIS — F17210 Nicotine dependence, cigarettes, uncomplicated: Secondary | ICD-10-CM | POA: Insufficient documentation

## 2019-11-06 DIAGNOSIS — S0990XA Unspecified injury of head, initial encounter: Secondary | ICD-10-CM | POA: Diagnosis present
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
## 2019-11-06 NOTE — ED Notes (Signed)
Patient transported to CT 
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
## 2019-11-06 NOTE — Discharge Instructions (Signed)
Return if any problems.

## 2019-11-06 NOTE — ED Notes (Signed)
Patient instructed to recheck blood pressure throughout the day.

## 2020-03-30 ENCOUNTER — Encounter (HOSPITAL_COMMUNITY): Payer: Self-pay

## 2020-03-30 ENCOUNTER — Other Ambulatory Visit: Payer: Self-pay

## 2020-03-30 ENCOUNTER — Emergency Department (HOSPITAL_COMMUNITY)
Admission: EM | Admit: 2020-03-30 | Discharge: 2020-03-31 | Disposition: A | Discharge status: Home / Self Care | Payer: Medicare Other | Attending: Emergency Medicine | Admitting: Emergency Medicine

## 2020-03-30 DIAGNOSIS — R45851 Suicidal ideations: Secondary | ICD-10-CM | POA: Insufficient documentation

## 2020-03-30 DIAGNOSIS — R443 Hallucinations, unspecified: Secondary | ICD-10-CM | POA: Diagnosis not present

## 2020-03-30 DIAGNOSIS — F259 Schizoaffective disorder, unspecified: Secondary | ICD-10-CM | POA: Diagnosis present

## 2020-03-30 DIAGNOSIS — F310 Bipolar disorder, current episode hypomanic: Secondary | ICD-10-CM | POA: Diagnosis not present

## 2020-03-30 DIAGNOSIS — R4182 Altered mental status, unspecified: Secondary | ICD-10-CM | POA: Insufficient documentation

## 2020-03-30 DIAGNOSIS — E876 Hypokalemia: Secondary | ICD-10-CM

## 2020-03-30 DIAGNOSIS — Z9114 Patient's other noncompliance with medication regimen: Secondary | ICD-10-CM | POA: Diagnosis not present

## 2020-03-30 DIAGNOSIS — R451 Restlessness and agitation: Secondary | ICD-10-CM | POA: Diagnosis present

## 2020-03-30 DIAGNOSIS — F1910 Other psychoactive substance abuse, uncomplicated: Secondary | ICD-10-CM | POA: Diagnosis present

## 2020-03-30 DIAGNOSIS — J45909 Unspecified asthma, uncomplicated: Secondary | ICD-10-CM | POA: Diagnosis not present

## 2020-03-30 DIAGNOSIS — F329 Major depressive disorder, single episode, unspecified: Secondary | ICD-10-CM | POA: Diagnosis not present

## 2020-03-30 DIAGNOSIS — Z046 Encounter for general psychiatric examination, requested by authority: Secondary | ICD-10-CM | POA: Insufficient documentation

## 2020-03-30 DIAGNOSIS — Z20822 Contact with and (suspected) exposure to covid-19: Secondary | ICD-10-CM | POA: Insufficient documentation

## 2020-03-30 DIAGNOSIS — I10 Essential (primary) hypertension: Secondary | ICD-10-CM | POA: Diagnosis not present

## 2020-03-30 DIAGNOSIS — F1994 Other psychoactive substance use, unspecified with psychoactive substance-induced mood disorder: Secondary | ICD-10-CM | POA: Diagnosis present

## 2020-03-30 DIAGNOSIS — F1410 Cocaine abuse, uncomplicated: Secondary | ICD-10-CM | POA: Diagnosis present

## 2020-03-30 DIAGNOSIS — F17210 Nicotine dependence, cigarettes, uncomplicated: Secondary | ICD-10-CM | POA: Insufficient documentation

## 2020-03-30 DIAGNOSIS — R45850 Homicidal ideations: Secondary | ICD-10-CM | POA: Diagnosis not present

## 2020-03-30 DIAGNOSIS — Z79899 Other long term (current) drug therapy: Secondary | ICD-10-CM | POA: Diagnosis not present

## 2020-03-30 LAB — COMPREHENSIVE METABOLIC PANEL
ALT: 27 U/L (ref 0–44)
AST: 40 U/L (ref 15–41)
Albumin: 4.3 g/dL (ref 3.5–5.0)
Alkaline Phosphatase: 68 U/L (ref 38–126)
Anion gap: 16 — ABNORMAL HIGH (ref 5–15)
BUN: 12 mg/dL (ref 6–20)
CO2: 18 mmol/L — ABNORMAL LOW (ref 22–32)
Calcium: 8.9 mg/dL (ref 8.9–10.3)
Chloride: 105 mmol/L (ref 98–111)
Creatinine, Ser: 0.99 mg/dL (ref 0.44–1.00)
GFR, Estimated: >60 mL/min (ref >60)
Glucose, Bld: 88 mg/dL (ref 70–99)
Potassium: 2.8 mmol/L — ABNORMAL LOW (ref 3.5–5.1)
Sodium: 139 mmol/L (ref 135–145)
Total Bilirubin: 0.8 mg/dL (ref 0.3–1.2)
Total Protein: 7.3 g/dL (ref 6.5–8.1)

## 2020-03-30 LAB — RAPID URINE DRUG SCREEN, HOSP PERFORMED
Amphetamines: POSITIVE — AB
Barbiturates: NOT DETECTED
Benzodiazepines: POSITIVE — AB
Cocaine: POSITIVE — AB
Opiates: NOT DETECTED
Tetrahydrocannabinol: POSITIVE — AB

## 2020-03-30 LAB — CBC WITH DIFFERENTIAL/PLATELET
Abs Immature Granulocytes: 0.11 K/uL — ABNORMAL HIGH (ref 0.00–0.07)
Basophils Absolute: 0 K/uL (ref 0.0–0.1)
Basophils Relative: 0 %
Eosinophils Absolute: 0 K/uL (ref 0.0–0.5)
Eosinophils Relative: 0 %
HCT: 36.4 % (ref 36.0–46.0)
Hemoglobin: 12.2 g/dL (ref 12.0–15.0)
Immature Granulocytes: 1 %
Lymphocytes Relative: 8 %
Lymphs Abs: 1.6 K/uL (ref 0.7–4.0)
MCH: 28.8 pg (ref 26.0–34.0)
MCHC: 33.5 g/dL (ref 30.0–36.0)
MCV: 86.1 fL (ref 80.0–100.0)
Monocytes Absolute: 1.4 K/uL — ABNORMAL HIGH (ref 0.1–1.0)
Monocytes Relative: 7 %
Neutro Abs: 16.7 K/uL — ABNORMAL HIGH (ref 1.7–7.7)
Neutrophils Relative %: 84 %
Platelets: 318 K/uL (ref 150–400)
RBC: 4.23 MIL/uL (ref 3.87–5.11)
RDW: 14.2 % (ref 11.5–15.5)
WBC: 19.8 K/uL — ABNORMAL HIGH (ref 4.0–10.5)
nRBC: 0 % (ref 0.0–0.2)

## 2020-03-30 LAB — ETHANOL: Alcohol, Ethyl (B): <10 mg/dL (ref <10)

## 2020-03-30 LAB — RESP PANEL BY RT-PCR (FLU A&B, COVID) ARPGX2
Influenza A by PCR: NEGATIVE
Influenza B by PCR: NEGATIVE
SARS Coronavirus 2 by RT PCR: NEGATIVE

## 2020-03-30 LAB — ACETAMINOPHEN LEVEL: Acetaminophen (Tylenol), Serum: <10 ug/mL — ABNORMAL LOW (ref 10–30)

## 2020-03-30 LAB — I-STAT BETA HCG BLOOD, ED (MC, WL, AP ONLY): I-stat hCG, quantitative: <5 m[IU]/mL (ref <5)

## 2020-03-30 LAB — SALICYLATE LEVEL: Salicylate Lvl: <7 mg/dL — ABNORMAL LOW (ref 7.0–30.0)

## 2020-03-30 MED ORDER — OLANZAPINE 10 MG PO TABS
10.0000 mg | ORAL_TABLET | Freq: Two times a day (BID) | ORAL | Status: DC
  Administered 2020-03-30 – 2020-03-31 (×3): 10 mg via ORAL
  Filled 2020-03-30 (×3): qty 1

## 2020-03-30 MED ORDER — FLUOXETINE HCL 20 MG PO CAPS
60.0000 mg | ORAL_CAPSULE | Freq: Every morning | ORAL | Status: DC
  Administered 2020-03-30 – 2020-03-31 (×2): 60 mg via ORAL
  Filled 2020-03-30 (×2): qty 3

## 2020-03-30 MED ORDER — CARBAMAZEPINE ER 300 MG PO CP12: 300.0000 mg | ORAL_CAPSULE | ORAL | Status: DC

## 2020-03-30 MED ORDER — CARBAMAZEPINE ER 200 MG PO CP12
300.0000 mg | ORAL_CAPSULE | Freq: Every day | ORAL | Status: DC
  Filled 2020-03-30: qty 1

## 2020-03-30 MED ORDER — CARBAMAZEPINE ER 200 MG PO CP12
900.0000 mg | ORAL_CAPSULE | Freq: Every day | ORAL | Status: DC
  Administered 2020-03-30: 900 mg via ORAL
  Filled 2020-03-30: qty 1

## 2020-03-30 MED ORDER — METOPROLOL SUCCINATE ER 50 MG PO TB24
25.0000 mg | ORAL_TABLET | Freq: Every day | ORAL | Status: DC
  Administered 2020-03-30: 25 mg via ORAL
  Filled 2020-03-30: qty 1

## 2020-03-30 MED ORDER — MONTELUKAST SODIUM 10 MG PO TABS
10.0000 mg | ORAL_TABLET | Freq: Every day | ORAL | Status: DC
  Administered 2020-03-30: 10 mg via ORAL
  Filled 2020-03-30: qty 1

## 2020-03-30 MED ORDER — POTASSIUM CHLORIDE CRYS ER 20 MEQ PO TBCR
40.0000 meq | EXTENDED_RELEASE_TABLET | Freq: Once | ORAL | Status: AC
  Administered 2020-03-30: 40 meq via ORAL
  Filled 2020-03-30: qty 2

## 2020-03-30 NOTE — ED Notes (Signed)
Spoke with mother, Denice Paradise, x 2- mother states she and husband are going to magistrate to get IVC papers for pt. Provider made aware

## 2020-03-30 NOTE — ED Notes (Signed)
Please call spouse, Elta Guadeloupe, with any updates- ph# in chart

## 2020-03-30 NOTE — ED Triage Notes (Signed)
Pt brought in by EMS for non-compliance w psych meds- hx of bipolar and schizophrenia, family told EMS they think pt hasn't taken meds in 2 + weeks. Pt was having manic episode last night, hyperactive, thought she "seizing", had an unwitnessed fall last night (mom told EMS). EMS gave 5 haldol IM and 2.5 versed IV (0800), pt now heavily sedated and not answering questions. EMS put pt in c-collar to support airway bc she was "floppy after meds"

## 2020-03-30 NOTE — ED Notes (Signed)
Pt answered SI/HI questions, denies both.

## 2020-03-30 NOTE — BH Assessment (Signed)
Clinician spoke to Junior, RN to fax pt's IVC paperwork. Once IVC paperwork is received clinician to engage pt in TTS assessment.    Fax: [phone] or [phone].   Vertell Novak, [place], [facility], [facility] Triage Specialist [phone]

## 2020-03-30 NOTE — ED Notes (Signed)
Removed c-collar per MDs instructions

## 2020-03-30 NOTE — ED Notes (Signed)
Patient sleeping/rise and fall of chest breathing observed 

## 2020-03-30 NOTE — ED Notes (Signed)
Patient is sleeping rise and fall of chest breathing observed

## 2020-03-30 NOTE — BH Assessment (Addendum)
Disposition Lindon Romp, NP, patient meets inpatient criteria. Irine Seal, AC no appropriate beds. Disposition SW will secure placement in the AM. Junior, RN, informed of disposition.

## 2020-03-30 NOTE — BH Assessment (Signed)
Clinician received IVC paperwork however perJunior, RN pt received medication and is currently sleeping. RN to inform TTS when pt is able to be assessed.    Vertell Novak, [place], [facility], [facility] Triage Specialist [phone]

## 2020-03-30 NOTE — ED Notes (Signed)
Family at bedside, provider made aware 

## 2020-03-30 NOTE — ED Notes (Signed)
Mother, [name], [phone]

## 2020-03-30 NOTE — ED Notes (Signed)
Provider at bedside

## 2020-03-30 NOTE — BH Assessment (Signed)
Per Junior, RN to is able to engage in TTS assessment. Clinician expressed the pt will be seen as soon as a clinician is available.    Vertell Novak, [place], [facility], [facility] Triage Specialist [phone]

## 2020-03-30 NOTE — ED Notes (Signed)
Received urine sample from patient Sent off to lab Patient also had a loose bowel movent

## 2020-03-30 NOTE — ED Notes (Addendum)
Patients belongings placed in (1) bag with patients labels in locker for room #28 Patient is dressed in purple scrubs/yellow socks

## 2020-03-30 NOTE — ED Notes (Addendum)
Per EMS- pt was having manic episode and threatening to harm herself and others, pt currently sedated from meds given en route- will have to complete SI/HI assessment when pt more alert

## 2020-03-30 NOTE — ED Notes (Signed)
Pt to room 28. Pt alert, calm, cooperative, no s/s of distress. Pt oriented to unit and room. Pt resting comfortably in bed

## 2020-03-30 NOTE — ED Notes (Signed)
Pt currently refusing in and out cath, was okay w purewick, will put on pt shortly.
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
## 2020-03-31 DIAGNOSIS — F259 Schizoaffective disorder, unspecified: Secondary | ICD-10-CM | POA: Diagnosis present

## 2020-03-31 DIAGNOSIS — F1410 Cocaine abuse, uncomplicated: Secondary | ICD-10-CM | POA: Diagnosis not present

## 2020-03-31 DIAGNOSIS — F1994 Other psychoactive substance use, unspecified with psychoactive substance-induced mood disorder: Secondary | ICD-10-CM

## 2020-03-31 DIAGNOSIS — R451 Restlessness and agitation: Secondary | ICD-10-CM

## 2020-03-31 DIAGNOSIS — F310 Bipolar disorder, current episode hypomanic: Secondary | ICD-10-CM

## 2020-03-31 DIAGNOSIS — F1910 Other psychoactive substance abuse, uncomplicated: Secondary | ICD-10-CM | POA: Diagnosis present

## 2020-03-31 MED ORDER — CARBAMAZEPINE ER 200 MG PO TB12
900.0000 mg | ORAL_TABLET | Freq: Every day | ORAL | Status: DC
  Filled 2020-03-31: qty 1

## 2020-03-31 MED ORDER — CARBAMAZEPINE ER 200 MG PO TB12
300.0000 mg | ORAL_TABLET | Freq: Every day | ORAL | Status: DC
  Administered 2020-03-31: 300 mg via ORAL
  Filled 2020-03-31: qty 1

## 2020-03-31 NOTE — ED Provider Notes (Signed)
Patient awaiting inpatient placement by behavioral health   Milton Ferguson, MD 03/31/20 [phone]

## 2020-03-31 NOTE — Patient Outreach (Signed)
ED Peer Support Specialist Patient Intake (Complete at intake & 30-60 Day Follow-up)  Name: [name]  MRN: [id number]  Age: 35 y.o.   Date of Admission: 03/31/2020  Intake: Initial Comments:      Primary Reason Admitted: Psychiatric Evaluation   Lab values: Alcohol/ETOH: Not completed Positive UDS? Yes Amphetamines: Yes Barbiturates: No Benzodiazepines: Yes Cocaine: Yes Opiates: No Cannabinoids: Yes  Demographic information: Gender: Female Ethnicity: White Marital Status: Single Insurance Status: Marketing executive (Work Neurosurgeon, Physicist, medical, etc.: No Lives with: Partner/Spouse Living situation:    Reported Patient History: Patient reported health conditions: Bipolar disorder,Depression,Schizophrenia Patient aware of HIV and hepatitis status: No  In past year, has patient visited ED for any reason? Yes  Number of ED visits: 4  Reason(s) for visit: various reasons  In past year, has patient been hospitalized for any reason? Yes  Number of hospitalizations:    Reason(s) for hospitalization:    In past year, has patient been arrested? No  Number of arrests:    Reason(s) for arrest:    In past year, has patient been incarcerated? No  Number of incarcerations:    Reason(s) for incarceration:    In past year, has patient received medication-assisted treatment? No  In past year, patient received the following treatments: Other (comment)  In past year, has patient received any harm reduction services? Yes  Did this include any of the following? Other (comment)  In past year, has patient received care from a mental health provider for diagnosis other than SUD? Yes  In past year, is this first time patient has overdosed? No  Number of past overdoses:    In past year, is this first time patient has been hospitalized for an overdose? No  Number of hospitalizations for overdose(s):    Is patient currently  receiving treatment for a mental health diagnosis? No  Patient reports experiencing difficulty participating in SUD treatment: Yes    Most important reason(s) for this difficulty? Did not seek treatment/was not ready to initiate treatment,Scheduling issues  Has patient received prior services for treatment? Unsure  In past, patient has received services from following agencies:    Plan of Care:  Suggested follow up at these agencies/treatment centers: Other (comment)  Other information: CPSS met with Pt an were able to gain information to better assist Pt at this time. CPSS processed with Pt about the importance of Pt wanting to better the quality of her life. CPSS addressed a few options that Pt may benefit from utilizing there services. CPSS explained to Pt that she can leave from the hospital to a facility if she chooses to do so. Pt stated that she wants to go home an read over a few facility to way her options. CPSS are also leaving contact information for Pt to fallow up with CPSS in community.    Aaron Edelman Bhavya Grand, CPSS  03/31/2020 11:47 AM

## 2020-03-31 NOTE — Discharge Instructions (Addendum)
Follow-up with your family doctor next week.  Follow-up also as instructed by behavioral health

## 2020-03-31 NOTE — BH Assessment (Signed)
Comprehensive Clinical Assessment (CCA) Note  03/31/2020 SAM WUNSCHEL [id number]   Disposition Lindon Romp, NP, patient meets inpatient criteria. Irine Seal, AC no appropriate beds. Disposition SW will secure placement in the AM. Junior, RN, informed of disposition.   The patient demonstrates the following risk factors for suicide: Chronic risk factors for suicide include: psychiatric disorder of bipolar disorder and schizo-affective disorder . Acute risk factors for suicide include: family or marital conflict and loss (financial, interpersonal, professional), grief/loss. Protective factors for this patient include: positive social support and responsibility to others (children, family). Considering these factors, the overall suicide risk at this point appears to be high. Patient is appropriate for outpatient follow up after inpatient treatment.  Flowsheet Row ED from 03/30/2020 in [facility] DEPT ED from 07/06/2019 in [facility] DEPT  C-SSRS RISK CATEGORY No Risk High Risk     Therefore a 1:1 sitter for suicide precautions is recommended.   Reniyah Gootee is a 35 year old female presenting under IVC due to threatening SI and HI towards family members and not taking medications. Patient was brought in by EMS for non-compliance with psych medications in the past 2 weeks. Patient has history of bipolar and schizophrenia. Patient was having manic episode last night, hyperactive, thought she was "seizing", had an unwitnessed fall last night (mom told EMS). When asked, why are you here, patient stated "my husband sent me here for seizures, something like that". Patient denied SI, HI, psychosis and alcohol/drug usage. Patient denied prior psych hospitalizations, prior suicide attempts and self-harming behaviors. Patient denied having a psychiatrist and therapist. When asked about psych medications, patient stated "I don't know".   Patient  resides with husband and 25 month old daughter. Patient is currently unemployed. Patient denied access to guns. Patient was drowsy and sleepy during assessment.   Per IVC, petitioner, [name], husband, [phone] Respondent has been previously diagnosed with bipolar disorder and schizo-affective disorder she has medication but is non-compliant with her medication regimen. She has history of mental commitments, most recently in summer of 2021. Respondent is talking people that are not there. Making up words and requires a red drink. EMT and police were contacted, and they transported her to hospital. Respondent has threatened to kill her husband and her 73-year-old daughter. She states she wishes she were dead so everyone would be better off.   Collateral contact Spoke with [name], husband, with patients consent. Patient restated IVC petition. Elta Guadeloupe reported behaviors started 2 weeks ago when she stopped taking psych medications. Elta Guadeloupe reported patient is grieving the death of her grandmother whom she was really close, died this past weekend. Husband reported patient also needs a therapist.   Chief Complaint:  Chief Complaint  Patient presents with  . Psychiatric Evaluation   Visit Diagnosis: Major depressive disorder  CCA Biopsychosocial Intake/Chief Complaint:  IVC due to SI, HI and hallucinations. Patient not taking medications.  Current Symptoms/Problems: IVC due to SI, HI and hallucinations. Patient not taking medications.  Patient Reported Schizophrenia/Schizoaffective Diagnosis in Past: Yes  Strengths: uta  Preferences: uta  Abilities: uta  Type of Services Patient Feels are Needed: "none"  Initial Clinical Notes/Concerns: No data recorded  Mental Health Symptoms Depression:  None   Duration of Depressive symptoms: No data recorded  Mania:  None   Anxiety:   None   Psychosis:  None   Duration of Psychotic symptoms: No data recorded  Trauma:  None   Obsessions:   None  Compulsions:  None   Inattention:  None   Hyperactivity/Impulsivity:  N/A   Oppositional/Defiant Behaviors:  None   Emotional Irregularity:  None   Other Mood/Personality Symptoms:  No data recorded   Mental Status Exam Appearance and self-care  Stature:  Average   Weight:  Average weight   Clothing:  Age-appropriate   Grooming:  Normal   Cosmetic use:  Age appropriate   Posture/gait:  Normal   Motor activity:  Not Remarkable   Sensorium  Attention:  Normal   Concentration:  Normal   Orientation:  X5   Recall/memory:  Normal   Affect and Mood  Affect:  Appropriate   Mood:  Depressed   Relating  Eye contact:  Normal   Facial expression:  Depressed   Attitude toward examiner:  Cooperative   Thought and Language  Speech flow: Clear and Coherent   Thought content:  Appropriate to Mood and Circumstances   Preoccupation:  None   Hallucinations:  None   Organization:  No data recorded  [employer] of Knowledge:  Fair   Intelligence:  Average   Abstraction:  Normal   Judgement:  Fair   Art therapist:  No data recorded  Insight:  Fair   Decision Making:  Impulsive; Confused   Social Functioning  Social Maturity:  No data recorded  Social Judgement:  No data recorded  Stress  Stressors:  Other (Comment); Grief/losses   Coping Ability:  Overwhelmed; Exhausted   Skill Deficits:  Self-control; Self-care; Decision making   Supports:  Family    Religion:   Leisure/Recreation: Leisure / Recreation Do You Have Hobbies?: Yes Leisure and Hobbies: play the violin  Exercise/Diet: Exercise/Diet Do You Follow a Special Diet?: No Do You Have Any Trouble Sleeping?: No  CCA Employment/Education Employment/Work Situation: Employment / Work Copywriter, advertising Employment situation: Unemployed Has patient ever been in the [employer]?: No  Education: Education Is Patient Currently Attending School?: No Did Teacher, adult education From  [employer]?: Yes Did Physicist, medical?: Yes What Type of College Degree Do you Have?: Associates Degree Science Did Heritage manager?: No What Was Your Major?: Science Did You Have An Individualized Education Program (IIEP): No Did You Have Any Difficulty At [employer]?: No Patient's Education Has Been Impacted by Current Illness: No  CCA Family/Childhood History Family and Relationship History: Family history Marital status: Married Does patient have children?: Yes How many children?: 1 How is patient's relationship with their children?: good  Childhood History:  Childhood History Description of patient's relationship with caregiver when they were a child: uta Patient's description of current relationship with people who raised him/her: uta How were you disciplined when you got in trouble as a child/adolescent?: uta Did patient suffer any verbal/emotional/physical/sexual abuse as a child?: No Did patient suffer from severe childhood neglect?: No Has patient ever been sexually abused/assaulted/raped as an adolescent or adult?: No Was the patient ever a victim of a crime or a disaster?: No Witnessed domestic violence?: No Has patient been affected by domestic violence as an adult?: No  Child/Adolescent Assessment:   CCA Substance Use Alcohol/Drug Use: Alcohol / Drug Use Pain Medications: see MAR Prescriptions: see MAR Over the Counter: see MAR History of alcohol / drug use?: No history of alcohol / drug abuse   ASAM's:  Six Dimensions of Multidimensional Assessment  Dimension 1:  Acute Intoxication and/or Withdrawal Potential:      Dimension 2:  Biomedical Conditions and Complications:      Dimension 3:  Emotional, Behavioral, or Cognitive Conditions and Complications:     Dimension 4:  Readiness to Change:     Dimension 5:  Relapse, Continued use, or Continued Problem Potential:     Dimension 6:  Recovery/Living Environment:     ASAM Severity Score:    ASAM  Recommended Level of Treatment:     Substance use Disorder (SUD)   Recommendations for Services/Supports/Treatments: Recommendations for Services/Supports/Treatments Recommendations For Services/Supports/Treatments: Medication Management,Individual Therapy  DSM5 Diagnoses: Patient Active Problem List   Diagnosis Date Noted  . Schizo affective schizophrenia ([place])   . Bipolar affective disorder, current episode hypomanic ([place]) 07/07/2019  . Cocaine abuse ([place]) 07/07/2019  . Ruptured Ovarian cyst 06/15/2018  . Enterocolitis 06/15/2018  . RLQ abdominal pain   . Colitis 06/12/2018  . Normal pregnancy 03/30/2014  . Palpitations 01/22/2014  . Tobacco use 01/22/2014  . Pregnancy 01/22/2014  . Family history of early CAD 01/22/2014  . Abdominal pain 02/15/2012  . Rectal bleed 02/15/2012   Patient Centered Plan: Patient is on the following Treatment Plan(s):   Referrals to Alternative Service(s): Referred to Alternative Service(s):   Place:   Date:   Time:    Referred to Alternative Service(s):   Place:   Date:   Time:    Referred to Alternative Service(s):   Place:   Date:   Time:    Referred to Alternative Service(s):   Place:   Date:   Time:     Venora Maples, [facility]
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
## 2020-08-08 ENCOUNTER — Emergency Department (HOSPITAL_COMMUNITY)
Admission: EM | Admit: 2020-08-08 | Discharge: 2020-08-10 | Disposition: A | Discharge status: Home / Self Care | Payer: Medicare Other | Attending: Emergency Medicine | Admitting: Emergency Medicine

## 2020-08-08 ENCOUNTER — Encounter (HOSPITAL_COMMUNITY): Payer: Self-pay | Admitting: Emergency Medicine

## 2020-08-08 ENCOUNTER — Emergency Department (HOSPITAL_COMMUNITY): Payer: Medicare Other

## 2020-08-08 DIAGNOSIS — Z046 Encounter for general psychiatric examination, requested by authority: Secondary | ICD-10-CM | POA: Insufficient documentation

## 2020-08-08 DIAGNOSIS — F1120 Opioid dependence, uncomplicated: Secondary | ICD-10-CM | POA: Diagnosis not present

## 2020-08-08 DIAGNOSIS — F1994 Other psychoactive substance use, unspecified with psychoactive substance-induced mood disorder: Secondary | ICD-10-CM | POA: Diagnosis present

## 2020-08-08 DIAGNOSIS — E876 Hypokalemia: Secondary | ICD-10-CM

## 2020-08-08 DIAGNOSIS — Z20822 Contact with and (suspected) exposure to covid-19: Secondary | ICD-10-CM | POA: Insufficient documentation

## 2020-08-08 DIAGNOSIS — F259 Schizoaffective disorder, unspecified: Secondary | ICD-10-CM | POA: Diagnosis present

## 2020-08-08 DIAGNOSIS — Y900 Blood alcohol level of less than 20 mg/100 ml: Secondary | ICD-10-CM | POA: Diagnosis not present

## 2020-08-08 DIAGNOSIS — F17210 Nicotine dependence, cigarettes, uncomplicated: Secondary | ICD-10-CM | POA: Diagnosis not present

## 2020-08-08 DIAGNOSIS — F419 Anxiety disorder, unspecified: Secondary | ICD-10-CM

## 2020-08-08 DIAGNOSIS — D72829 Elevated white blood cell count, unspecified: Secondary | ICD-10-CM | POA: Diagnosis not present

## 2020-08-08 DIAGNOSIS — R519 Headache, unspecified: Secondary | ICD-10-CM | POA: Diagnosis not present

## 2020-08-08 DIAGNOSIS — Z79899 Other long term (current) drug therapy: Secondary | ICD-10-CM | POA: Insufficient documentation

## 2020-08-08 DIAGNOSIS — F23 Brief psychotic disorder: Secondary | ICD-10-CM | POA: Insufficient documentation

## 2020-08-08 DIAGNOSIS — F3113 Bipolar disorder, current episode manic without psychotic features, severe: Secondary | ICD-10-CM | POA: Insufficient documentation

## 2020-08-08 DIAGNOSIS — J45909 Unspecified asthma, uncomplicated: Secondary | ICD-10-CM | POA: Insufficient documentation

## 2020-08-08 DIAGNOSIS — R451 Restlessness and agitation: Secondary | ICD-10-CM | POA: Diagnosis not present

## 2020-08-08 DIAGNOSIS — F310 Bipolar disorder, current episode hypomanic: Secondary | ICD-10-CM | POA: Diagnosis not present

## 2020-08-08 DIAGNOSIS — I10 Essential (primary) hypertension: Secondary | ICD-10-CM | POA: Diagnosis not present

## 2020-08-08 DIAGNOSIS — F3010 Manic episode without psychotic symptoms, unspecified: Secondary | ICD-10-CM

## 2020-08-08 LAB — COMPREHENSIVE METABOLIC PANEL
ALT: 21 U/L (ref 0–44)
AST: 30 U/L (ref 15–41)
Albumin: 3.7 g/dL (ref 3.5–5.0)
Alkaline Phosphatase: 55 U/L (ref 38–126)
Anion gap: 10 (ref 5–15)
BUN: 12 mg/dL (ref 6–20)
CO2: 21 mmol/L — ABNORMAL LOW (ref 22–32)
Calcium: 8.8 mg/dL — ABNORMAL LOW (ref 8.9–10.3)
Chloride: 110 mmol/L (ref 98–111)
Creatinine, Ser: 0.94 mg/dL (ref 0.44–1.00)
GFR, Estimated: >60 mL/min (ref >60)
Glucose, Bld: 88 mg/dL (ref 70–99)
Potassium: 3 mmol/L — ABNORMAL LOW (ref 3.5–5.1)
Sodium: 141 mmol/L (ref 135–145)
Total Bilirubin: 1 mg/dL (ref 0.3–1.2)
Total Protein: 6.2 g/dL — ABNORMAL LOW (ref 6.5–8.1)

## 2020-08-08 LAB — CBC
HCT: 33.3 % — ABNORMAL LOW (ref 36.0–46.0)
Hemoglobin: 10.9 g/dL — ABNORMAL LOW (ref 12.0–15.0)
MCH: 28 pg (ref 26.0–34.0)
MCHC: 32.7 g/dL (ref 30.0–36.0)
MCV: 85.6 fL (ref 80.0–100.0)
Platelets: 335 10*3/uL (ref 150–400)
RBC: 3.89 MIL/uL (ref 3.87–5.11)
RDW: 14.9 % (ref 11.5–15.5)
WBC: 14.3 10*3/uL — ABNORMAL HIGH (ref 4.0–10.5)
nRBC: 0 % (ref 0.0–0.2)

## 2020-08-08 LAB — I-STAT BETA HCG BLOOD, ED (MC, WL, AP ONLY): I-stat hCG, quantitative: <5 m[IU]/mL (ref <5)

## 2020-08-08 LAB — RESP PANEL BY RT-PCR (FLU A&B, COVID) ARPGX2
Influenza A by PCR: NEGATIVE
Influenza B by PCR: NEGATIVE
SARS Coronavirus 2 by RT PCR: NEGATIVE

## 2020-08-08 LAB — AMMONIA: Ammonia: 22 umol/L (ref 9–35)

## 2020-08-08 LAB — ETHANOL: Alcohol, Ethyl (B): <10 mg/dL (ref <10)

## 2020-08-08 MED ORDER — POTASSIUM CHLORIDE CRYS ER 20 MEQ PO TBCR: 40.0000 meq | EXTENDED_RELEASE_TABLET | Freq: Once | ORAL | Status: DC

## 2020-08-08 MED ORDER — LORAZEPAM 2 MG/ML IJ SOLN: 1.0000 mg | Freq: Once | INTRAMUSCULAR | Status: DC

## 2020-08-08 NOTE — ED Notes (Signed)
TTS in progress 
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
## 2020-08-08 NOTE — ED Notes (Signed)
Bilateral wrist and ankle restraints removed. Pt resting quietly at this time.

## 2020-08-08 NOTE — ED Triage Notes (Signed)
Pt BIB [facility] from home, family concerned because pt has been acting bizarrely since this morning. Hx schizophrenia/bipolar, family reports pt not taking meds. Initially combative with EMS, arrived in 4 point restraints, given '10mg'$  haldol and '5mg'$  versed pta. Pt with word salad.

## 2020-08-08 NOTE — BH Assessment (Signed)
Comprehensive Clinical Assessment (CCA) Note  08/08/2020 [name] [id number]  DISPOSITION: Gave clinical report to Maria John, PA-C who determined Pt meets criteria for inpatient psychiatric treatment. [name], [facility] confirmed [address] is at capacity. Notified Maria Client, PA-C and Maria Flash, RN of recommendation.  The patient demonstrates the following risk factors for suicide: Chronic risk factors for suicide include: psychiatric disorder of bipolar disorder and substance use disorder. Acute risk factors for suicide include:  medication noncompliance . Protective factors for this patient include: positive social support and responsibility to others (children, family). Considering these factors, the overall suicide risk at this point appears to be moderate. Patient is appropriate for outpatient follow up.  [name] ED from 03/30/2020 in [facility] DEPT ED from 07/06/2019 in [name]  C-SSRS RISK CATEGORY No Risk High Risk      Pt is a 35 year old married female who presents unaccompanied to [name] ED via EMS due to bizarre behavior and aggression. Pt has a diagnosis of bipolar disorder and family reported concerns to EMS, stating that Pt has been acting bizarrely since this morning. Pt was combative with EMS and arrived to [facility] in 4-point restraints and given medication. She presented to EDP with word salad.  During assessment, Pt appears irritable and was minimally cooperative, repeatedly stating that she was tired. She could not explain why she was in the ED. She denies current suicidal ideation. She denied thoughts of harming others. Pt did not respond when asked if she was experiencing auditory or visual hallucinations. She denied use of alcohol or other substances, however Pt's medical record indicates Pt has a history of using cocaine, benzodiazepines, amphetamines and cannabis. Pt's  urine drug screen is in process. She says she has mental health providers but could not say their names. Pt did not respond to any other questions.  Pt's medical record indicates Pt has a history taking herself off psychiatric medications and experiencing manic episodes and threatening to harm herself and others. She was evaluated in the ED in March 2022 for manic symptoms and substance use.   EDP was able to speak to Pt's mother on the phone and unable to reach spouse.  Mother states that yesterday when she was talking to Pt on the phone she noticed that she was starting to become manic, states that she has not taken her psychiatric medications in over 4 months, took her self off of them.  States that she had an episode like this in March that was similar.  TTS attempted to contact Pt's husband, [name], at [phone] and there was no answer and no option for voicemail.  Pt is dressed in hospital scrubs, drowsy, and oriented to person and place but not time or situation. Pt speaks in a mumbled tone, at moderate volume and normal pace. Motor behavior appears normal. Eye contact is avoidant. Pt's mood is irritable and affect is congruent with mood. Pt's mental status appears altered. Pt has been placed under IVC.   Chief Complaint:  Chief Complaint  Patient presents with   Aggressive Behavior   Visit Diagnosis:  F31.13 Bipolar I disorder, Current or most recent episode manic, Severe Rule Out: Substance-induced mood disorder   CCA Screening, Triage and Referral (STR)  Patient Reported Information How did you hear about [place]? Family/Friend  Referral name: No data recorded Referral phone number: No data recorded  Whom do you see for routine medical problems? No  data recorded Practice/Facility Name: No data recorded Practice/Facility Phone Number: No data recorded Name of Contact: No data recorded Contact Number: No data recorded Contact Fax Number: No data recorded Prescriber Name:  No data recorded Prescriber Address (if known): No data recorded  What Is the Reason for Your Visit/Call Today? Pt BIB [facility] from [facility], family concerned because pt has been acting bizarrely since this morning. Hx of bipolar disorder, family reports pt not taking meds. Initially combative with EMS, arrived in 4 point restraints, given '10mg'$  haldol and '5mg'$  versed pta. Pt with word salad.  How Long Has This Been Causing You Problems? > than 6 months  What Do You Feel Would Help You the Most Today? Treatment for Depression or other mood problem   Have You Recently Been in Any Inpatient Treatment (Hospital/Detox/Crisis Center/28-Day Program)? No data recorded Name/Location of Program/Hospital:No data recorded How Long Were You There? No data recorded When Were You Discharged? No data recorded  Have You Ever Received Services From [facility] Before? No data recorded Who Do You See at [facility]? No data recorded  Have You Recently Had Any Thoughts About Hurting Yourself? No  Are You Planning to Commit Suicide/Harm Yourself At This time? No   Have you Recently Had Thoughts About [place]? No  Explanation: No data recorded  Have You Used Any Alcohol or Drugs in the Past 24 Hours? No  How Long Ago Did You Use Drugs or Alcohol? No data recorded What Did You Use and How Much? No data recorded  Do You Currently Have a Therapist/Psychiatrist? Yes  Name of Therapist/Psychiatrist: Pt cannot remember name   Have You Been Recently Discharged From Any Office Practice or Programs? No  Explanation of Discharge From Practice/Program: No data recorded    CCA Screening Triage Referral Assessment Type of Contact: Tele-Assessment  Is this Initial or Reassessment? Initial Assessment  Date Telepsych consult ordered in [facility]:  08/08/20  Time Telepsych consult ordered in [facility]:  2110   Patient Reported Information Reviewed? No data recorded Patient Left Without Being Seen? No data  recorded Reason for Not Completing Assessment: No data recorded  Collateral Involvement: Pt's mother: [name]  [phone]   Does Patient Have a Republic? No data recorded Name and Contact of Legal Guardian: No data recorded If Minor and Not Living with Parent(s), Who has Custody? NA  Is CPS involved or ever been involved? Never  Is APS involved or ever been involved? Never   Patient Determined To Be At Risk for Harm To Self or Others Based on Review of Patient Reported Information or Presenting Complaint? Yes, for Harm to Others  Method: No Plan  Availability of Means: No access or NA  Intent: Vague intent or NA  Notification Required: No need or identified person  Additional Information for Danger to Others Potential: Active psychosis  Additional Comments for Danger to Others Potential: Pt presents with aggressive behavior  Are There Guns or Other Weapons in Your Home? No  Types of Guns/Weapons: No data recorded Are These Weapons Safely Secured?                            No data recorded Who Could Verify You Are Able To Have These Secured: No data recorded Do You Have any Outstanding Charges, Pending Court Dates, Parole/Probation? Unknown  Contacted To Inform of Risk of Harm To Self or Others: Family/Significant Other:   Location  of Assessment: [facility] ED   Does Patient Present under Involuntary Commitment? Yes  IVC Papers Initial File Date: 08/08/20   [place] of Residence: [place]   Patient Currently Receiving the Following Services: Medication Management   Determination of Need: Emergent (2 hours)   Options For Referral: Inpatient Hospitalization     CCA Biopsychosocial Intake/Chief Complaint:  IVC due to SI, HI and hallucinations. Patient not taking medications.  Current Symptoms/Problems: IVC due to SI, HI and hallucinations. Patient not taking medications.   Patient Reported Schizophrenia/Schizoaffective Diagnosis in Past:  Yes   Strengths: Unable to assess due to AMS  Preferences: uta  Abilities: uta   Type of Services Patient Feels are Needed: "none"   Initial Clinical Notes/Concerns: No data recorded  Mental Health Symptoms Depression:   Change in energy/activity; Irritability; Sleep (too much or little)   Duration of Depressive symptoms:  Greater than two weeks   Mania:   Change in energy/activity; Irritability; Racing thoughts; Recklessness   Anxiety:    Difficulty concentrating; Irritability; Restlessness; Sleep   Psychosis:   Grossly disorganized speech   Duration of Psychotic symptoms:  Less than six months   Trauma:   None   Obsessions:   None   Compulsions:   None   Inattention:   None   Hyperactivity/Impulsivity:   N/A   Oppositional/Defiant Behaviors:   None   Emotional Irregularity:   None   Other Mood/Personality Symptoms:   NA    Mental Status Exam Appearance and self-care  Stature:   Average   Weight:   Average weight   Clothing:   -- (Scrubs)   Grooming:   Normal   Cosmetic use:   Age appropriate   Posture/gait:   Normal   Motor activity:   Not Remarkable   Sensorium  Attention:   Confused   Concentration:   Scattered   Orientation:   Person; Place   Recall/memory:   -- (Unable to assess due to AMS)   Affect and Mood  Affect:   Labile   Mood:   Irritable   Relating  Eye contact:   Avoided   Facial expression:   Angry   Attitude toward examiner:   Uninterested; Irritable   Thought and Language  Speech flow:  Soft   Thought content:   -- (Unable to assess due to AMS)   Preoccupation:   -- (Unable to assess due to AMS)   Hallucinations:   -- (Unable to assess due to AMS)   Organization:  No data recorded  [employer] of Knowledge:   Fair   Intelligence:   Average   Abstraction:   -- (Unable to assess due to AMS)   Judgement:   Impaired   Reality Testing:   Variable    Insight:   Poor   Decision Making:   Impulsive   Social Functioning  Social Maturity:   -- (Unable to assess due to AMS)   Social Judgement:   -- (Unable to assess due to AMS)   Stress  Stressors:   -- (Unable to assess due to AMS)   Coping Ability:   Overwhelmed   Skill Deficits:   Self-control   Supports:   Family     Religion: Religion/Spirituality Are You A Religious Person?:  (Unable to assess due to AMS)  Leisure/Recreation: Leisure / Recreation Do You Have Hobbies?: Yes Leisure and Hobbies: play the violin  Exercise/Diet: Exercise/Diet Do You Exercise?:  (Unable to assess due to AMS) Have You  Gained or Lost A Significant Amount of Weight in the Past Six Months?:  (Unable to assess due to AMS) Do You Follow a Special Diet?:  (Unable to assess due to AMS) Do You Have Any Trouble Sleeping?:  (Unable to assess due to AMS)   CCA Employment/Education Employment/Work Situation: Employment / Work Situation Employment Situation:  (Unable to assess due to AMS) Patient's Job has Been Impacted by Current Illness:  (Unable to assess due to AMS) Has Patient ever Been in the [employer]?:  (Unable to assess due to AMS)  Education: Education Is Patient Currently Attending School?:  (Unable to assess due to AMS) Did You Attend College?: Yes What Type of College Degree Do you Have?: Associates Degree Science Did You Have An Individualized Education Program (IIEP): No Did You Have Any Difficulty At School?: No Patient's Education Has Been Impacted by Current Illness: No   CCA Family/Childhood History Family and Relationship History: Family history Marital status: Married Does patient have children?: Yes How many children?: 1 How is patient's relationship with their children?: good  Childhood History:  Childhood History Did patient suffer any verbal/emotional/physical/sexual abuse as a child?:  (Unable to assess due to AMS) Did patient suffer from severe  childhood neglect?:  (Unable to assess due to AMS) Has patient ever been sexually abused/assaulted/raped as an adolescent or adult?:  (Unable to assess due to AMS) Was the patient ever a victim of a crime or a disaster?:  (Unable to assess due to AMS) Witnessed domestic violence?:  (Unable to assess due to AMS) Has patient been affected by domestic violence as an adult?:  (Unable to assess due to AMS)  Child/Adolescent Assessment:     CCA Substance Use Alcohol/Drug Use:                           ASAM's:  Six Dimensions of Multidimensional Assessment  Dimension 1:  Acute Intoxication and/or Withdrawal Potential:      Dimension 2:  Biomedical Conditions and Complications:      Dimension 3:  Emotional, Behavioral, or Cognitive Conditions and Complications:     Dimension 4:  Readiness to Change:     Dimension 5:  Relapse, Continued use, or Continued Problem Potential:     Dimension 6:  Recovery/Living Environment:     ASAM Severity Score:    ASAM Recommended Level of Treatment:     Substance use Disorder (SUD)    Recommendations for Services/Supports/Treatments:    DSM5 Diagnoses: Patient Active Problem List   Diagnosis Date Noted   Polysubstance abuse ([place]) 03/31/2020   Substance induced mood disorder ([place]) 03/31/2020   Schizo affective schizophrenia ([place])    Agitation    Bipolar affective disorder, current episode hypomanic ([place]) 07/07/2019   Cocaine abuse ([place]) 07/07/2019   Ruptured Ovarian cyst 06/15/2018   Enterocolitis 06/15/2018   RLQ abdominal pain    Colitis 06/12/2018   Normal pregnancy 03/30/2014   Palpitations 01/22/2014   Tobacco use 01/22/2014   Pregnancy 01/22/2014   Family history of early CAD 01/22/2014   Abdominal pain 02/15/2012   Rectal bleed 02/15/2012    Patient Centered Plan: Patient is on the following Treatment Plan(s):  Anxiety, Impulse Control, and Substance Abuse   Referrals to Alternative Service(s): Referred to  Alternative Service(s):   Place:   Date:   Time:    Referred to Alternative Service(s):   Place:   Date:   Time:    Referred to Alternative Service(s):  Place:   Date:   Time:    Referred to Alternative Service(s):   Place:   Date:   Time:     Evelena Peat, [facility]

## 2020-08-08 NOTE — ED Notes (Signed)
IVC paper work completed.3 copies given to RN, one copy in medical records.original copy put into red folder in purple zone.

## 2020-08-09 NOTE — ED Notes (Signed)
Pt changed into BH scrubs and socks.  PO fluids, snack and warm blankets given to pt. Pt appears calm and cooperative at this time and expresses desire to go home

## 2020-08-09 NOTE — ED Notes (Signed)
Pt talking with spouse on the phone at this time

## 2020-08-09 NOTE — ED Notes (Signed)
Pt woke up, asking questions about POC. RN updated pt on plan for inpatient tx, pt became tearful stating she wanted to talk to someone because she doesn't want inpt, she wants to go home to her daughter. RN called [facility] to get pt reassessed for today.

## 2020-08-09 NOTE — ED Notes (Signed)
Pt denies needs at this time.  Calm and cooperative
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
## 2020-08-09 NOTE — ED Notes (Signed)
Pt awake, given [place] sandwich bag and caffeine free coke.

## 2020-08-09 NOTE — Progress Notes (Signed)
Per Ashley Royalty, patient meets criteria for inpatient treatment. There are no available or appropriate beds at [facility] today. CSW faxed referrals to the following facilities for review:  [facility]   TTS will continue to seek bed placement.  Glennie Isle, MSW, [place], LCAS-A Phone: [phone] Disposition/TOC

## 2020-08-09 NOTE — Progress Notes (Signed)
CSW received call from [facility] in reference to attempting to speak with the patient's nurse. CSW was informed that this patient will be review with the doctor for possible placement at the facility.  Glennie Isle, MSW, [place], LCAS-A Phone: [phone] Disposition/TOC

## 2020-08-09 NOTE — ED Notes (Signed)
Pt ambulated to bathroom 

## 2020-08-09 NOTE — ED Notes (Signed)
Pt completing TTS in room.

## 2020-08-10 ENCOUNTER — Encounter (HOSPITAL_COMMUNITY): Payer: Self-pay | Admitting: Registered Nurse

## 2020-08-10 DIAGNOSIS — F1120 Opioid dependence, uncomplicated: Secondary | ICD-10-CM | POA: Diagnosis not present

## 2020-08-10 DIAGNOSIS — F310 Bipolar disorder, current episode hypomanic: Secondary | ICD-10-CM

## 2020-08-10 DIAGNOSIS — F259 Schizoaffective disorder, unspecified: Secondary | ICD-10-CM | POA: Diagnosis not present

## 2020-08-10 DIAGNOSIS — F1920 Other psychoactive substance dependence, uncomplicated: Secondary | ICD-10-CM

## 2020-08-10 DIAGNOSIS — F1994 Other psychoactive substance use, unspecified with psychoactive substance-induced mood disorder: Secondary | ICD-10-CM | POA: Diagnosis not present

## 2020-08-10 DIAGNOSIS — F3113 Bipolar disorder, current episode manic without psychotic features, severe: Secondary | ICD-10-CM | POA: Diagnosis not present

## 2020-08-10 MED ORDER — TRAZODONE HCL 50 MG PO TABS: 50.0000 mg | ORAL_TABLET | Freq: Every evening | ORAL | Status: DC | PRN

## 2020-08-10 MED ORDER — OLANZAPINE 5 MG PO TABS: 5.0000 mg | ORAL_TABLET | Freq: Every day | ORAL | 0 refills | Status: AC

## 2020-08-10 MED ORDER — OLANZAPINE 5 MG PO TBDP: 5.0000 mg | ORAL_TABLET | Freq: Every day | ORAL | Status: DC

## 2020-08-10 MED ORDER — POTASSIUM CHLORIDE CRYS ER 20 MEQ PO TBCR
40.0000 meq | EXTENDED_RELEASE_TABLET | Freq: Once | ORAL | Status: AC
  Administered 2020-08-10: 40 meq via ORAL
  Filled 2020-08-10: qty 2

## 2020-08-10 MED ORDER — TRAZODONE HCL 50 MG PO TABS: 50.0000 mg | ORAL_TABLET | Freq: Every day | ORAL | 0 refills | Status: AC
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
## 2020-08-10 NOTE — ED Notes (Signed)
Patient pleasant and cooperative. Sleeping in room.

## 2020-08-10 NOTE — ED Notes (Signed)
IVC papers rescinded, ordered by Dr. Ashok Cordia.

## 2020-08-10 NOTE — Progress Notes (Signed)
Patient information has been sent to [facility] via secure chat to review for potential admission. Patient meets inpatient criteria per Margorie John, PA-C.   Situation ongoing, CSW will continue to monitor progress.    Signed:  Mariea Clonts, MSW, LCSW-A  08/10/2020 7:48 AM

## 2020-08-10 NOTE — ED Notes (Signed)
Pt is in blue scrubs, V/S were checked, D/C papers were explained/understood. Pt has spoken with family regarding a ride home, no belongings at facility family took them upon her initial arrival. Pt is left for D/C.

## 2020-08-10 NOTE — Discharge Instructions (Addendum)
It was our pleasure to provide your ER care today - we hope that you feel better.  Take zyprexa and trazodone as prescribed.   From the lab tests, your potassium level is low - eat plenty of fruits and vegetables, and follow up with primary care doctor in one week.   Follow up with primary care doctor and your behavioral health provider in the next couple weeks as planned.   For mental health issues and/or crisis, you may also go to the [facility] - it is open 24/7 and walk-ins are welcome.   Return to ER if worse, new symptoms, high fevers, trouble breathing, new/severe pain, or other concern.

## 2020-08-10 NOTE — ED Provider Notes (Signed)
Emergency Medicine Observation Re-evaluation Note  [name] is a 35 y.o. female, seen on rounds today.  Pt initially presented to the ED for complaints of agitated and manic type behavior.  This afternoon pt reports feeling much improved, has been very calm and cooperative, conversant, pleasant. No c/o currently other than wanting to return home. Pt expresses desire to get back on meds, and states has outpt bh f/u already arranged on 8/15.   Physical Exam  BP 109/64 (BP Location: Right Arm)   Pulse (!) 103   Temp 98.2 F (36.8 C) (Oral)   Resp 16   SpO2 98%  Physical Exam General: alert, calm, conversant, cooperative Cardiac: regular rate Lungs: breathing comfortably Psych: normal mood and affect. Pt does not appear to be responding to internal stimuli. No delusions or hallucinations. Pt with no thoughts of harm to self or others.   ED Course / MDM  EKG:EKG Interpretation  Date/Time:  Saturday August 08 2020 19:39:31 EDT Ventricular Rate:  106 PR Interval:  130 QRS Duration: 101 QT Interval:  368 QTC Calculation: 489 R Axis:   84 Text Interpretation: Sinus tachycardia Borderline prolonged QT interval Confirmed by Davonna Belling [phone]) on 08/09/2020 12:05:19 PM  I have reviewed the labs performed to date as well as medications administered while in observation.  Recent changes in the last 24 hours include observation and stabilization in ED, with [facility] team reassessment.   Plan  Current plan is that [facility] team has reassessed and indicates psych clear for d/c. They indicate pt has f/u on 8/15, and request rx for zyprexa 5 mg qhs and trazodone 50 mg qhs  - provided.  Pt currently appears stable for d/c.        Lajean Saver, MD 08/10/20 [phone]

## 2020-08-10 NOTE — ED Notes (Signed)
TTS finished at this time

## 2020-10-21 ENCOUNTER — Encounter (HOSPITAL_COMMUNITY): Payer: Self-pay | Admitting: Emergency Medicine

## 2020-10-21 ENCOUNTER — Emergency Department (HOSPITAL_COMMUNITY)
Admission: EM | Admit: 2020-10-21 | Discharge: 2020-10-22 | Disposition: A | Discharge status: Home / Self Care | Payer: Medicare Other | Attending: Emergency Medicine | Admitting: Emergency Medicine

## 2020-10-21 ENCOUNTER — Other Ambulatory Visit: Payer: Self-pay

## 2020-10-21 DIAGNOSIS — R059 Cough, unspecified: Secondary | ICD-10-CM | POA: Insufficient documentation

## 2020-10-21 DIAGNOSIS — H5710 Ocular pain, unspecified eye: Secondary | ICD-10-CM | POA: Diagnosis present

## 2020-10-21 DIAGNOSIS — Z5321 Procedure and treatment not carried out due to patient leaving prior to being seen by health care provider: Secondary | ICD-10-CM | POA: Diagnosis not present

## 2020-10-21 DIAGNOSIS — Z20822 Contact with and (suspected) exposure to covid-19: Secondary | ICD-10-CM | POA: Insufficient documentation

## 2020-10-21 LAB — RESP PANEL BY RT-PCR (FLU A&B, COVID) ARPGX2
Influenza A by PCR: NEGATIVE
Influenza B by PCR: NEGATIVE
SARS Coronavirus 2 by RT PCR: NEGATIVE
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
## 2020-10-22 NOTE — ED Notes (Signed)
Patient left on own accord °

## 2021-03-04 ENCOUNTER — Emergency Department (HOSPITAL_BASED_OUTPATIENT_CLINIC_OR_DEPARTMENT_OTHER)
Admission: EM | Admit: 2021-03-04 | Discharge: 2021-03-04 | Disposition: A | Discharge status: Home / Self Care | Payer: Medicare Other | Attending: Emergency Medicine | Admitting: Emergency Medicine

## 2021-03-04 ENCOUNTER — Other Ambulatory Visit (HOSPITAL_BASED_OUTPATIENT_CLINIC_OR_DEPARTMENT_OTHER): Payer: Self-pay

## 2021-03-04 ENCOUNTER — Emergency Department (HOSPITAL_BASED_OUTPATIENT_CLINIC_OR_DEPARTMENT_OTHER): Payer: Medicare Other | Admitting: Radiology

## 2021-03-04 ENCOUNTER — Emergency Department (HOSPITAL_BASED_OUTPATIENT_CLINIC_OR_DEPARTMENT_OTHER): Payer: Medicare Other

## 2021-03-04 ENCOUNTER — Encounter (HOSPITAL_BASED_OUTPATIENT_CLINIC_OR_DEPARTMENT_OTHER): Payer: Self-pay | Admitting: Family Medicine

## 2021-03-04 ENCOUNTER — Other Ambulatory Visit: Payer: Self-pay

## 2021-03-04 DIAGNOSIS — R1032 Left lower quadrant pain: Secondary | ICD-10-CM | POA: Diagnosis present

## 2021-03-04 DIAGNOSIS — N739 Female pelvic inflammatory disease, unspecified: Secondary | ICD-10-CM | POA: Diagnosis not present

## 2021-03-04 DIAGNOSIS — N730 Acute parametritis and pelvic cellulitis: Secondary | ICD-10-CM

## 2021-03-04 LAB — LIPASE, BLOOD: Lipase: 22 U/L (ref 11–51)

## 2021-03-04 LAB — WET PREP, GENITAL
Clue Cells Wet Prep HPF POC: NONE SEEN
Sperm: NONE SEEN
Trich, Wet Prep: NONE SEEN
WBC, Wet Prep HPF POC: <10 (ref <10)
Yeast Wet Prep HPF POC: NONE SEEN

## 2021-03-04 LAB — CBC
HCT: 40.2 % (ref 36.0–46.0)
Hemoglobin: 13.3 g/dL (ref 12.0–15.0)
MCH: 27.6 pg (ref 26.0–34.0)
MCHC: 33.1 g/dL (ref 30.0–36.0)
MCV: 83.4 fL (ref 80.0–100.0)
Platelets: 352 10*3/uL (ref 150–400)
RBC: 4.82 MIL/uL (ref 3.87–5.11)
RDW: 14.2 % (ref 11.5–15.5)
WBC: 18.3 10*3/uL — ABNORMAL HIGH (ref 4.0–10.5)
nRBC: 0 % (ref 0.0–0.2)

## 2021-03-04 LAB — URINALYSIS, ROUTINE W REFLEX MICROSCOPIC
Bilirubin Urine: NEGATIVE
Glucose, UA: NEGATIVE mg/dL
Hgb urine dipstick: NEGATIVE
Ketones, ur: NEGATIVE mg/dL
Leukocytes,Ua: NEGATIVE
Nitrite: NEGATIVE
Protein, ur: NEGATIVE mg/dL
Specific Gravity, Urine: 1.014 (ref 1.005–1.030)
pH: 5.5 (ref 5.0–8.0)

## 2021-03-04 LAB — COMPREHENSIVE METABOLIC PANEL
ALT: 10 U/L (ref 0–44)
AST: 9 U/L — ABNORMAL LOW (ref 15–41)
Albumin: 4.2 g/dL (ref 3.5–5.0)
Alkaline Phosphatase: 59 U/L (ref 38–126)
Anion gap: 10 (ref 5–15)
BUN: 8 mg/dL (ref 6–20)
CO2: 20 mmol/L — ABNORMAL LOW (ref 22–32)
Calcium: 9.1 mg/dL (ref 8.9–10.3)
Chloride: 108 mmol/L (ref 98–111)
Creatinine, Ser: 0.67 mg/dL (ref 0.44–1.00)
GFR, Estimated: >60 mL/min (ref >60)
Glucose, Bld: 109 mg/dL — ABNORMAL HIGH (ref 70–99)
Potassium: 3.5 mmol/L (ref 3.5–5.1)
Sodium: 138 mmol/L (ref 135–145)
Total Bilirubin: 0.5 mg/dL (ref 0.3–1.2)
Total Protein: 6.6 g/dL (ref 6.5–8.1)

## 2021-03-04 LAB — HCG, SERUM, QUALITATIVE: Preg, Serum: NEGATIVE

## 2021-03-04 LAB — HIV ANTIBODY (ROUTINE TESTING W REFLEX): HIV Screen 4th Generation wRfx: NONREACTIVE

## 2021-03-04 MED ORDER — OXYCODONE-ACETAMINOPHEN 5-325 MG PO TABS
1.0000 | ORAL_TABLET | ORAL | 0 refills | Status: AC | PRN
  Filled 2021-03-04: qty 10, 2d supply, fill #0

## 2021-03-04 MED ORDER — ONDANSETRON HCL 4 MG PO TABS
4.0000 mg | ORAL_TABLET | Freq: Three times a day (TID) | ORAL | 0 refills | Status: AC | PRN
  Filled 2021-03-04: qty 20, 7d supply, fill #0

## 2021-03-04 MED ORDER — FENTANYL CITRATE PF 50 MCG/ML IJ SOSY
100.0000 ug | PREFILLED_SYRINGE | Freq: Once | INTRAMUSCULAR | Status: AC
  Administered 2021-03-04: 100 ug via INTRAVENOUS
  Filled 2021-03-04: qty 2

## 2021-03-04 MED ORDER — SODIUM CHLORIDE 0.9 % IV BOLUS
1000.0000 mL | Freq: Once | INTRAVENOUS | Status: AC
  Administered 2021-03-04: 1000 mL via INTRAVENOUS

## 2021-03-04 MED ORDER — MORPHINE SULFATE (PF) 2 MG/ML IV SOLN: 2.0000 mg | Freq: Once | INTRAVENOUS | Status: DC

## 2021-03-04 MED ORDER — IOHEXOL 300 MG/ML  SOLN
85.0000 mL | Freq: Once | INTRAMUSCULAR | Status: AC | PRN
  Administered 2021-03-04: 85 mL via INTRAVENOUS

## 2021-03-04 MED ORDER — METRONIDAZOLE 500 MG PO TABS
500.0000 mg | ORAL_TABLET | Freq: Two times a day (BID) | ORAL | 0 refills | Status: AC
  Filled 2021-03-04: qty 28, 14d supply, fill #0

## 2021-03-04 MED ORDER — ONDANSETRON HCL 4 MG/2ML IJ SOLN
4.0000 mg | Freq: Once | INTRAMUSCULAR | Status: AC
  Administered 2021-03-04: 4 mg via INTRAVENOUS
  Filled 2021-03-04: qty 2

## 2021-03-04 MED ORDER — DOXYCYCLINE HYCLATE 100 MG PO CAPS
100.0000 mg | ORAL_CAPSULE | Freq: Two times a day (BID) | ORAL | 0 refills | Status: AC
  Filled 2021-03-04: qty 28, 14d supply, fill #0

## 2021-03-04 MED ORDER — CEFTRIAXONE SODIUM 500 MG IJ SOLR
500.0000 mg | Freq: Once | INTRAMUSCULAR | Status: AC
  Administered 2021-03-04: 500 mg via INTRAMUSCULAR
  Filled 2021-03-04: qty 500

## 2021-03-04 MED ORDER — OXYCODONE-ACETAMINOPHEN 5-325 MG PO TABS
1.0000 | ORAL_TABLET | Freq: Once | ORAL | Status: AC
  Administered 2021-03-04: 2 via ORAL
  Filled 2021-03-04: qty 2

## 2021-03-04 MED ORDER — MORPHINE SULFATE (PF) 4 MG/ML IV SOLN
4.0000 mg | Freq: Once | INTRAVENOUS | Status: AC
  Administered 2021-03-04: 4 mg via INTRAVENOUS
  Filled 2021-03-04: qty 1

## 2021-03-04 MED ORDER — LIDOCAINE HCL (PF) 1 % IJ SOLN
INTRAMUSCULAR | Status: AC
Start: 2021-03-04 — End: 2021-03-04
  Administered 2021-03-04: 5 mL
  Filled 2021-03-04: qty 5

## 2021-03-04 NOTE — ED Provider Notes (Signed)
[facility] EMERGENCY DEPT Provider Note   CSN: [id number] Arrival date & time: 03/04/21  2836     History  Chief Complaint  Patient presents with   Abdominal Pain    [name] is a 36 y.o. female.  36 yo woman presents with 6 hours of severe abdominal pain and nausea. She states that she was in her usual health yesterday with some mild LLQ pain, but woke up in the early hours of the morning with severe abdominal pain and nausea. She has not tried to take any sips as her nausea is intense, no emesis. She had a BM yesterday, has not passed flatus today. She has no prior history of abdominal surgeries. She has had concern for R ovarian torsion in 2020, as well as presumed SBO from colitis in 2020, treated medically. She reports that her LMP was about a month ago, she is not on birth control, she has had unprotected sex, and she reports her periods are regular.     Home Medications Prior to Admission medications   Medication Sig Start Date End Date Taking? Authorizing Provider  doxycycline (VIBRAMYCIN) 100 MG capsule Take 1 capsule (100 mg total) by mouth 2 (two) times daily. 03/04/21  Yes Gladys Damme, MD  metroNIDAZOLE (FLAGYL) 500 MG tablet Take 1 tablet (500 mg total) by mouth 2 (two) times daily for 14 days. 03/04/21 03/18/21 Yes Gladys Damme, MD  ondansetron (ZOFRAN) 4 MG tablet Take 1 tablet (4 mg total) by mouth every 8 (eight) hours as needed for nausea or vomiting. 03/04/21  Yes Gladys Damme, MD  oxyCODONE-acetaminophen (PERCOCET/ROXICET) 5-325 MG tablet Take 1 tablet by mouth every 4 (four) hours as needed for severe pain. 03/04/21  Yes Gladys Damme, MD  OLANZapine (ZYPREXA) 5 MG tablet Take 1 tablet (5 mg total) by mouth at bedtime. Patient not taking: Reported on 03/04/2021 08/10/20   Lajean Saver, MD  traZODone (DESYREL) 50 MG tablet Take 1 tablet (50 mg total) by mouth at bedtime. Patient not taking: Reported on 03/04/2021 08/10/20   Lajean Saver, MD      Allergies    Amoxicillin    Review of Systems   Review of Systems  Constitutional:  Negative for activity change, appetite change, chills and fever.  HENT:  Negative for congestion, rhinorrhea, sinus pain, sneezing and sore throat.   Respiratory:  Negative for cough, chest tightness and shortness of breath.   Cardiovascular:  Negative for chest pain, palpitations and leg swelling.  Gastrointestinal:  Positive for abdominal pain and nausea. Negative for abdominal distention, blood in stool, constipation, diarrhea and vomiting.  Genitourinary:  Negative for dysuria, flank pain, frequency, urgency and vaginal bleeding.  Skin:  Positive for pallor. Negative for color change.  All other systems reviewed and are negative.  Physical Exam Updated Vital Signs BP 111/65    Pulse 98    Temp 99 F (37.2 C) (Oral)    Resp 16    Wt 90.7 kg    LMP 01/31/2021 (Approximate)    SpO2 95%    BMI 30.41 kg/m  Physical Exam Vitals and nursing note reviewed. Exam conducted with a chaperone present.  Constitutional:      Comments: Patient is alert, grimacing in pain and writhing in bed, mild distress  HENT:     Head: Normocephalic and atraumatic.     Mouth/Throat:     Mouth: Mucous membranes are moist.     Pharynx: Oropharynx is clear.  Eyes:  General: No scleral icterus.    Pupils: Pupils are equal, round, and reactive to light.  Cardiovascular:     Rate and Rhythm: Normal rate and regular rhythm.     Heart sounds: Normal heart sounds.  Pulmonary:     Effort: Pulmonary effort is normal. No respiratory distress.     Breath sounds: Normal breath sounds. No wheezing, rhonchi or rales.  Abdominal:     General: Abdomen is protuberant. Bowel sounds are normal. There is no distension or abdominal bruit. There are no signs of injury.     Palpations: Abdomen is soft. There is no hepatomegaly or mass.     Tenderness: There is abdominal tenderness in the suprapubic area. There is guarding.  There is no right CVA tenderness, left CVA tenderness or rebound. Negative signs include Murphy's sign, McBurney's sign and psoas sign.     Hernia: No hernia is present.     Comments: Guarding with stethoscope, pain diffusely from RLQ to LLQ, but worst on LLQ  Genitourinary:    Vagina: No signs of injury and foreign body. Tenderness present. No vaginal discharge, erythema or bleeding.     Cervix: Cervical motion tenderness present. No discharge or friability.     Uterus: Normal.      Adnexa:        Right: No mass, tenderness or fullness.         Left: Tenderness and fullness present.   Skin:    General: Skin is warm and dry.     Capillary Refill: Capillary refill takes less than 2 seconds.     Coloration: Skin is pale. Skin is not cyanotic, jaundiced or mottled.     Findings: No erythema or rash.  Neurological:     General: No focal deficit present.     Mental Status: She is oriented to person, place, and time.  Psychiatric:        Mood and Affect: Mood is anxious.        Behavior: Behavior normal.    ED Results / Procedures / Treatments   Labs (all labs ordered are listed, but only abnormal results are displayed) Labs Reviewed  COMPREHENSIVE METABOLIC PANEL - Abnormal; Notable for the following components:      Result Value   CO2 20 (*)    Glucose, Bld 109 (*)    AST 9 (*)    All other components within normal limits  CBC - Abnormal; Notable for the following components:   WBC 18.3 (*)    All other components within normal limits  WET PREP, GENITAL  LIPASE, BLOOD  URINALYSIS, ROUTINE W REFLEX MICROSCOPIC  HCG, SERUM, QUALITATIVE  HIV ANTIBODY (ROUTINE TESTING W REFLEX)  RPR  GC/CHLAMYDIA PROBE AMP () NOT AT [facility]    EKG None  Radiology CT ABDOMEN PELVIS W CONTRAST  Result Date: 03/04/2021 CLINICAL DATA:  Abdominal pain EXAM: CT ABDOMEN AND PELVIS WITH CONTRAST TECHNIQUE: Multidetector CT imaging of the abdomen and pelvis was performed using the standard  protocol following bolus administration of intravenous contrast. RADIATION DOSE REDUCTION: This exam was performed according to the departmental dose-optimization program which includes automated exposure control, adjustment of the mA and/or kV according to patient size and/or use of iterative reconstruction technique. CONTRAST:  71mL OMNIPAQUE IOHEXOL 300 MG/ML  SOLN COMPARISON:  CT abdomen and pelvis 06/12/2018 FINDINGS: Lower chest: No acute abnormality. Hepatobiliary: No focal liver abnormality is seen. No gallstones, gallbladder wall thickening, or biliary dilatation. Pancreas: Unremarkable. No pancreatic ductal dilatation or  surrounding inflammatory changes. Spleen: Normal in size without focal abnormality. Adrenals/Urinary Tract: Adrenal glands are unremarkable. Kidneys are normal, without renal calculi, focal lesion, or hydronephrosis. Bladder is unremarkable. Stomach/Bowel: No bowel obstruction, free air or pneumatosis. No bowel wall edema or thickening identified. No evidence of acute appendicitis. Vascular/Lymphatic: No significant vascular findings are present. No enlarged abdominal or pelvic lymph nodes. Reproductive: Uterus appears unremarkable. Lobulated cystic appearance in the bilateral adnexa/ovaries including a few somewhat elongated/tubular appearing structures with rim enhancement bilaterally. The cystic structures measure up to approximately 3.1 cm on the right and 4.5 cm on the left. Small amount of free fluid in the pelvis with adjacent peritoneal enhancement in the right posterior pelvis. Other: No ascites. Musculoskeletal: No suspicious bony lesions. IMPRESSION: Bilateral adnexal small cystic structures which may represent ovarian cysts and/or mildly distended fallopian tubes. Some of the structures demonstrate rim enhancement and there is a small amount of free fluid with adjacent peritoneal enhancement in the right posterior pelvis. Potential infectious/inflammatory process including  pyosalpinx/PID, correlate clinically. Electronically Signed   By: Ofilia Neas M.D.   On: 03/04/2021 12:27   DG Abdomen Acute W/Chest  Result Date: 03/04/2021 CLINICAL DATA:  36 year old female with lower abdominal pain on set this morning. EXAM: DG ABDOMEN ACUTE WITH 1 VIEW CHEST COMPARISON:  CT Abdomen and Pelvis 06/12/2018. FINDINGS: PA view of the chest at 1117 hours. Low normal lung volumes. Normal cardiac size and mediastinal contours. Both lungs appear clear. No pneumothorax or pneumoperitoneum. Upright and supine views of the abdomen and pelvis. Non obstructed bowel gas pattern. Normal abdominal and pelvic visceral contours. No acute osseous abnormality identified. Umbilical piercing suspected. IMPRESSION: 1. Normal bowel gas pattern, no free air. 2. Negative chest. Electronically Signed   By: Genevie Ann M.D.   On: 03/04/2021 11:33   US PELVIC COMPLETE W TRANSVAGINAL AND TORSION R/O  Result Date: 03/04/2021 CLINICAL DATA:  LEFT upper quadrant pain. EXAM: TRANSABDOMINAL AND TRANSVAGINAL ULTRASOUND OF PELVIS DOPPLER ULTRASOUND OF OVARIES TECHNIQUE: Both transabdominal and transvaginal ultrasound examinations of the pelvis were performed. Transabdominal technique was performed for global imaging of the pelvis including uterus, ovaries, adnexal regions, and pelvic cul-de-sac. It was necessary to proceed with endovaginal exam following the transabdominal exam to visualize the endometrium and ovaries. Color and duplex Doppler ultrasound was utilized to evaluate blood flow to the ovaries. COMPARISON:  None. FINDINGS: Uterus Measurements: 3.0 x 3.4 x 2.4 cm = volume: 12.5 mL. No fibroids or other mass visualized. Endometrium Thickness: Normal thickness 7 mm. Right ovary Measurements: 3.0 x 3.4 x 2.4 cm = volume: 12.5 mL. Normal appearance/no adnexal mass. Left ovary Measurements: 2.4 x 4.7 x 4.2 cm = volume: 24 mL. There is a periphery thickened cystic lesion within the LEFT ovary with internal echoes.  This mildly complex lesion measures 1.2 x 1.3 x 1.9 cm Pulsed Doppler evaluation of both ovaries demonstrates normal low-resistance arterial and venous waveforms. Other findings Small volume free fluid. IMPRESSION: 1. Normal uterus and endometrium. 2. Normal RIGHT ovary. 3. Thick-walled cystic lesion in the LEFT ovary is indeterminate. Potential involuting hemorrhagic cysts. Recommend follow-up ultrasound in 6-12 weeks. Reference: Radiology 2019 Nov; 293(2):359-371. Electronically Signed   By: Suzy Bouchard M.D.   On: 03/04/2021 11:26    Procedures Procedures    Medications Ordered in ED Medications  cefTRIAXone (ROCEPHIN) injection 500 mg (has no administration in time range)  morphine (PF) 4 MG/ML injection 4 mg (4 mg Intravenous Given 03/04/21 1027)  ondansetron (ZOFRAN) injection 4  mg (4 mg Intravenous Given 03/04/21 1027)  sodium chloride 0.9 % bolus 1,000 mL (1,000 mLs Intravenous New Bag/Given 03/04/21 1031)  fentaNYL (SUBLIMAZE) injection 100 mcg (100 mcg Intravenous Given 03/04/21 1200)  iohexol (OMNIPAQUE) 300 MG/ML solution 85 mL (85 mLs Intravenous Contrast Given 03/04/21 1205)  oxyCODONE-acetaminophen (PERCOCET/ROXICET) 5-325 MG per tablet 1-2 tablet (2 tablets Oral Given 03/04/21 1254)    ED Course/ Medical Decision Making/ A&P Clinical Course as of 03/04/21 1443  Thu Mar 04, 2021  1106 Notable leukocytosis to 18, will await imaging. Vitals stable. [CM]  1141 US shows thick-walled cystic lesion on L ovary, no sign of torsion presently. Radiology recommends f/u US in 6-12 weeks. Given presentation of severe pain and significant leukocytosis, will get CT to r/o intra-abdominal pathology.  [CM]  90 CT a/p shows cystic structure around ovaries and enlarged fallopian tubes, recommend correlation with PID on exam. Will perform pelvic exam, obtain G/C, wetprep, RPR, HIV. [CM]  1431 Patient had significant CMT and adnexal fullness on left on exam. Her spouse was not present for exam,  patient disclosed that her partner had an extramarital relationship about 6 months ago and was diagnosed with syphilis. She went to [facility] and was treated for syphilis at that time. She notes that she had some more vaginal discharge in the last day, clear, not malodorous, but a change from previously. [CM]    Clinical Course User Index [CM] Gladys Damme, MD                           Medical Decision Making 36 yo woman presents with sudden severe abdominal pain, no other infectious symptoms. Given history of SBO due to colitis vs concern for ovarian torsion in history, will obtain pelvic US with doppler and CT to evaluate for intraabdominal pathology. Ddx broad including SBO, ischemia, malrotation, ovarian torsion, ectopic pregnancy, diverticulitis/colitis, nephrolithiasis, appendicitis (less likely but possible given exam). Will obtain labs to evaluate for the above.   Amount and/or Complexity of Data Reviewed External Data Reviewed: labs, radiology and notes. Labs: ordered. Radiology: ordered. ECG/medicine tests: ordered.  Risk Prescription drug management.         Final Clinical Impression(s) / ED Diagnoses Final diagnoses:  PID (acute pelvic inflammatory disease)    Rx / DC Orders ED Discharge Orders          Ordered    oxyCODONE-acetaminophen (PERCOCET/ROXICET) 5-325 MG tablet  Every 4 hours PRN        03/04/21 1441    ondansetron (ZOFRAN) 4 MG tablet  Every 8 hours PRN        03/04/21 1441    doxycycline (VIBRAMYCIN) 100 MG capsule  2 times daily        03/04/21 1441    metroNIDAZOLE (FLAGYL) 500 MG tablet  2 times daily        03/04/21 1441              Gladys Damme, MD 03/04/21 1501    Margette Fast, MD 03/05/21 [phone]

## 2021-03-04 NOTE — Discharge Instructions (Addendum)
If you have severe pain that is not responsive to tylenol, ibuprofen, or percocet; you have nausea and vomiting and cannot tolerate any fluids by mouth, or a fever (temp about 100.4*F), please return for immediate evaluation.  You will need a repeat pelvic ultrasound in 6 weeks to ensure complete resolution of your infection.  Your partner will also require treatment, this can be done by his primary care provider, urgent care, or health department. Do not have sexual intercourse before completing treatment for both of you as you could become infected again.

## 2021-03-04 NOTE — ED Notes (Signed)
Asked pt if she could get [place] a urine sample. Pt is not able to at this time

## 2021-03-04 NOTE — ED Triage Notes (Signed)
Pt is here for severe abdominal pain which feels like "an ovarian cyst and sharp abdominal cramps" and began when she got up 04:30 and by 0500 she "could not move and could not walk" it was so bad.  Pt is brought in by ems.  Pt states that she has been nauseated with this and that her LMP was last month and that her LBM was yesterday.  Pt states that something similar occurred in the past and she was hospitalized "with a tube into my stomach" for a week, pt can not recall what the diagnosis was.

## 2021-03-05 LAB — RPR: RPR Ser Ql: NONREACTIVE

## 2021-03-05 LAB — GC/CHLAMYDIA PROBE AMP (~~LOC~~) NOT AT ARMC
Chlamydia: NEGATIVE
Comment: NEGATIVE
Comment: NORMAL
Neisseria Gonorrhea: NEGATIVE

## 2021-03-07 ENCOUNTER — Telehealth: Payer: Medicare Other | Admitting: Nurse Practitioner

## 2021-03-07 DIAGNOSIS — N760 Acute vaginitis: Secondary | ICD-10-CM

## 2021-03-07 MED ORDER — FLUCONAZOLE 150 MG PO TABS: 150.0000 mg | ORAL_TABLET | Freq: Once | ORAL | 0 refills | Status: AC

## 2021-03-07 NOTE — Progress Notes (Signed)
E-Visit for Vaginal Symptoms  We are sorry that you are not feeling well. Here is how we plan to help! Based on what you shared with me it looks like you: May have a yeast vaginosis  Vaginosis is an inflammation of the vagina that can result in discharge, itching and pain. The cause is usually a change in the normal balance of vaginal bacteria or an infection. Vaginosis can also result from reduced estrogen levels after menopause.  The most common causes of vaginosis are:   Bacterial vaginosis which results from an overgrowth of one on several organisms that are normally present in your vagina.   Yeast infections which are caused by a naturally occurring fungus called candida.   Vaginal atrophy (atrophic vaginosis) which results from the thinning of the vagina from reduced estrogen levels after menopause.   Trichomoniasis which is caused by a parasite and is commonly transmitted by sexual intercourse.  Factors that increase your risk of developing vaginosis include: Medications, such as antibiotics and steroids Uncontrolled diabetes Use of hygiene products such as bubble bath, vaginal spray or vaginal deodorant Douching Wearing damp or tight-fitting clothing Using an intrauterine device (IUD) for birth control Hormonal changes, such as those associated with pregnancy, birth control pills or menopause Sexual activity Having a sexually transmitted infection  Your treatment plan is A single Diflucan (fluconazole) 150mg  tablet once.  I have electronically sent this prescription into the pharmacy that you have chosen.  Be sure to take all of the medication as directed. Stop taking any medication if you develop a rash, tongue swelling or shortness of breath. Mothers who are breast feeding should consider pumping and discarding their breast milk while on these antibiotics. However, there is no consensus that infant exposure at these doses would be harmful.  Remember that medication creams can  weaken latex condoms.  Unfortunately, I am not able to answer the outstanding questions you have concerning your ER visit. I recommend you follow-up with your PCP as he/she may be able to provide the explanations in which you are seeking.    HOME CARE:  Good hygiene may prevent some types of vaginosis from recurring and may relieve some symptoms:  Avoid baths, hot tubs and whirlpool spas. Rinse soap from your outer genital area after a shower, and dry the area well to prevent irritation. Don't use scented or harsh soaps, such as those with deodorant or antibacterial action. Avoid irritants. These include scented tampons and pads. Wipe from front to back after using the toilet. Doing so avoids spreading fecal bacteria to your vagina.  Other things that may help prevent vaginosis include:  Don't douche. Your vagina doesn't require cleansing other than normal bathing. Repetitive douching disrupts the normal organisms that reside in the vagina and can actually increase your risk of vaginal infection. Douching won't clear up a vaginal infection. Use a latex condom. Both female and female latex condoms may help you avoid infections spread by sexual contact. Wear cotton underwear. Also wear pantyhose with a cotton crotch. If you feel comfortable without it, skip wearing underwear to bed. Yeast thrives in [employer] Your symptoms should improve in the next day or two.  GET HELP RIGHT AWAY IF:  You have pain in your lower abdomen ( pelvic area or over your ovaries) You develop nausea or vomiting You develop a fever Your discharge changes or worsens You have persistent pain with intercourse You develop shortness of breath, a rapid pulse, or you faint.  These symptoms could  be signs of problems or infections that need to be evaluated by a medical provider now.  MAKE SURE YOU   Understand these instructions. Will watch your condition. Will get help right away if you are not doing well or  get worse.  Thank you for choosing an e-visit.  Your e-visit answers were reviewed by a board certified advanced clinical practitioner to complete your personal care plan. Depending upon the condition, your plan could have included both over the counter or prescription medications.  Please review your pharmacy choice. Make sure the pharmacy is open so you can pick up prescription now. If there is a problem, you may contact your provider through [employer] and have the prescription routed to another pharmacy.  Your safety is important to [place]. If you have drug allergies check your prescription carefully.   For the next 24 hours you can use MyChart to ask questions about today's visit, request a non-urgent call back, or ask for a work or school excuse. You will get an email in the next two days asking about your experience. I hope that your e-visit has been valuable and will speed your recovery.   I have spent at least 5 minutes reviewing and documenting in the patient's chart.

## 2023-07-30 ENCOUNTER — Encounter (HOSPITAL_COMMUNITY): Payer: Self-pay

## 2023-07-30 ENCOUNTER — Observation Stay (HOSPITAL_BASED_OUTPATIENT_CLINIC_OR_DEPARTMENT_OTHER)

## 2023-07-30 ENCOUNTER — Other Ambulatory Visit: Payer: Self-pay

## 2023-07-30 ENCOUNTER — Emergency Department (HOSPITAL_COMMUNITY)

## 2023-07-30 ENCOUNTER — Observation Stay (HOSPITAL_COMMUNITY)
Admission: EM | Admit: 2023-07-30 | Discharge: 2023-07-30 | Disposition: A | Discharge status: Home / Self Care | Attending: Internal Medicine | Admitting: Internal Medicine

## 2023-07-30 DIAGNOSIS — M542 Cervicalgia: Secondary | ICD-10-CM | POA: Insufficient documentation

## 2023-07-30 DIAGNOSIS — R0789 Other chest pain: Principal | ICD-10-CM | POA: Insufficient documentation

## 2023-07-30 DIAGNOSIS — R079 Chest pain, unspecified: Secondary | ICD-10-CM

## 2023-07-30 DIAGNOSIS — E876 Hypokalemia: Secondary | ICD-10-CM | POA: Insufficient documentation

## 2023-07-30 DIAGNOSIS — F1490 Cocaine use, unspecified, uncomplicated: Secondary | ICD-10-CM

## 2023-07-30 DIAGNOSIS — I160 Hypertensive urgency: Principal | ICD-10-CM

## 2023-07-30 DIAGNOSIS — F1410 Cocaine abuse, uncomplicated: Secondary | ICD-10-CM | POA: Insufficient documentation

## 2023-07-30 DIAGNOSIS — G43809 Other migraine, not intractable, without status migrainosus: Secondary | ICD-10-CM | POA: Diagnosis not present

## 2023-07-30 DIAGNOSIS — E669 Obesity, unspecified: Secondary | ICD-10-CM | POA: Insufficient documentation

## 2023-07-30 DIAGNOSIS — F17210 Nicotine dependence, cigarettes, uncomplicated: Secondary | ICD-10-CM | POA: Insufficient documentation

## 2023-07-30 DIAGNOSIS — Z6830 Body mass index (BMI) 30.0-30.9, adult: Secondary | ICD-10-CM | POA: Diagnosis not present

## 2023-07-30 DIAGNOSIS — I10 Essential (primary) hypertension: Secondary | ICD-10-CM | POA: Insufficient documentation

## 2023-07-30 HISTORY — DX: Disorder of kidney and ureter, unspecified: N28.9

## 2023-07-30 HISTORY — DX: Essential (primary) hypertension: I10

## 2023-07-30 LAB — TROPONIN I (HIGH SENSITIVITY)
Troponin I (High Sensitivity): 4 ng/L (ref <18)
Troponin I (High Sensitivity): 4 ng/L (ref <18)

## 2023-07-30 LAB — BASIC METABOLIC PANEL WITH GFR
Anion gap: 13 (ref 5–15)
BUN: 9 mg/dL (ref 6–20)
CO2: 20 mmol/L — ABNORMAL LOW (ref 22–32)
Calcium: 9.2 mg/dL (ref 8.9–10.3)
Chloride: 98 mmol/L (ref 98–111)
Creatinine, Ser: 0.71 mg/dL (ref 0.44–1.00)
GFR, Estimated: >60 mL/min (ref >60)
Glucose, Bld: 93 mg/dL (ref 70–99)
Potassium: 2.6 mmol/L — CL (ref 3.5–5.1)
Sodium: 131 mmol/L — ABNORMAL LOW (ref 135–145)

## 2023-07-30 LAB — RAPID URINE DRUG SCREEN, HOSP PERFORMED
Amphetamines: NOT DETECTED
Barbiturates: NOT DETECTED
Benzodiazepines: NOT DETECTED
Cocaine: POSITIVE — AB
Opiates: NOT DETECTED
Tetrahydrocannabinol: NOT DETECTED

## 2023-07-30 LAB — CBC
HCT: 40.7 % (ref 36.0–46.0)
Hemoglobin: 14.2 g/dL (ref 12.0–15.0)
MCH: 29.7 pg (ref 26.0–34.0)
MCHC: 34.9 g/dL (ref 30.0–36.0)
MCV: 85.1 fL (ref 80.0–100.0)
Platelets: 351 K/uL (ref 150–400)
RBC: 4.78 MIL/uL (ref 3.87–5.11)
RDW: 13.2 % (ref 11.5–15.5)
WBC: 13.5 K/uL — ABNORMAL HIGH (ref 4.0–10.5)
nRBC: 0 % (ref 0.0–0.2)

## 2023-07-30 LAB — URINALYSIS, ROUTINE W REFLEX MICROSCOPIC
Bilirubin Urine: NEGATIVE
Glucose, UA: NEGATIVE mg/dL
Hgb urine dipstick: NEGATIVE
Ketones, ur: NEGATIVE mg/dL
Leukocytes,Ua: NEGATIVE
Nitrite: NEGATIVE
Protein, ur: NEGATIVE mg/dL
Specific Gravity, Urine: 1.002 — ABNORMAL LOW (ref 1.005–1.030)
pH: 6 (ref 5.0–8.0)

## 2023-07-30 LAB — ECHOCARDIOGRAM COMPLETE
Area-P 1/2: 4.15 cm2
Height: 68 in
S' Lateral: 2.5 cm
Weight: 3200 [oz_av]

## 2023-07-30 LAB — POTASSIUM: Potassium: 3.7 mmol/L (ref 3.5–5.1)

## 2023-07-30 LAB — ETHANOL: Alcohol, Ethyl (B): <15 mg/dL (ref <15)

## 2023-07-30 MED ORDER — ONDANSETRON HCL 4 MG PO TABS: 4.0000 mg | ORAL_TABLET | Freq: Four times a day (QID) | ORAL | Status: DC | PRN

## 2023-07-30 MED ORDER — BUTALBITAL-APAP-CAFFEINE 50-325-40 MG PO TABS
1.0000 | ORAL_TABLET | Freq: Once | ORAL | Status: AC
  Administered 2023-07-30: 1 via ORAL
  Filled 2023-07-30: qty 1

## 2023-07-30 MED ORDER — PERFLUTREN LIPID MICROSPHERE
1.0000 mL | INTRAVENOUS | Status: AC | PRN
  Administered 2023-07-30: 2 mL via INTRAVENOUS

## 2023-07-30 MED ORDER — LORAZEPAM 2 MG/ML IJ SOLN
2.0000 mg | Freq: Once | INTRAMUSCULAR | Status: AC
  Administered 2023-07-30: 2 mg via INTRAVENOUS
  Filled 2023-07-30: qty 1

## 2023-07-30 MED ORDER — METOCLOPRAMIDE HCL 5 MG/ML IJ SOLN
10.0000 mg | Freq: Once | INTRAMUSCULAR | Status: DC
  Filled 2023-07-30: qty 2

## 2023-07-30 MED ORDER — POTASSIUM CHLORIDE CRYS ER 20 MEQ PO TBCR
40.0000 meq | EXTENDED_RELEASE_TABLET | Freq: Once | ORAL | Status: AC
  Administered 2023-07-30: 40 meq via ORAL
  Filled 2023-07-30: qty 2

## 2023-07-30 MED ORDER — ONDANSETRON HCL 4 MG/2ML IJ SOLN: 4.0000 mg | Freq: Four times a day (QID) | INTRAMUSCULAR | Status: DC | PRN

## 2023-07-30 MED ORDER — POTASSIUM CHLORIDE 10 MEQ/100ML IV SOLN
10.0000 meq | INTRAVENOUS | Status: AC
  Administered 2023-07-30 (×3): 10 meq via INTRAVENOUS
  Filled 2023-07-30 (×2): qty 100

## 2023-07-30 MED ORDER — DIPHENHYDRAMINE HCL 50 MG/ML IJ SOLN
25.0000 mg | Freq: Once | INTRAMUSCULAR | Status: AC
  Administered 2023-07-30: 25 mg via INTRAVENOUS
  Filled 2023-07-30: qty 1

## 2023-07-30 MED ORDER — ACETAMINOPHEN 650 MG RE SUPP: 650.0000 mg | Freq: Four times a day (QID) | RECTAL | Status: DC | PRN

## 2023-07-30 MED ORDER — ACETAMINOPHEN 325 MG PO TABS: 650.0000 mg | ORAL_TABLET | Freq: Four times a day (QID) | ORAL | Status: DC | PRN

## 2023-07-30 MED ORDER — IOHEXOL 350 MG/ML SOLN
75.0000 mL | Freq: Once | INTRAVENOUS | Status: AC | PRN
  Administered 2023-07-30: 75 mL via INTRAVENOUS

## 2023-07-30 MED ORDER — HYDRALAZINE HCL 20 MG/ML IJ SOLN: 5.0000 mg | Freq: Once | INTRAMUSCULAR | Status: DC

## 2023-07-30 MED ORDER — POTASSIUM CHLORIDE 10 MEQ/100ML IV SOLN
INTRAVENOUS | Status: AC
  Filled 2023-07-30: qty 100

## 2023-07-30 MED ORDER — ENOXAPARIN SODIUM 40 MG/0.4ML IJ SOSY
40.0000 mg | PREFILLED_SYRINGE | INTRAMUSCULAR | Status: DC
Start: 2023-07-30 — End: 2023-07-31
  Administered 2023-07-30: 40 mg via SUBCUTANEOUS
  Filled 2023-07-30: qty 0.4

## 2023-07-30 NOTE — H&P (Signed)
 History and Physical    Patient: [name] FMW:[id number] DOB: [birth year] DOA: 07/30/2023 DOS: the patient was seen and examined on 07/30/2023 PCP: Patient, No Pcp Per  Patient coming from: Home  Chief Complaint: No chief complaint on file.  HPI: [name] is a 38 year old female with a history of substance use disorder and no other documented chronic medical problems presenting with chest pain that is rating up to her left lateral neck.  She stated that it started after she smoked cocaine on the evening of 07/29/2023.  The patient states that has been constant.  She denies any fevers, chills, shortness breath, nausea, vomiting, diarrhea abdominal pain, dysuria, hematuria.  She denies any coughing or hemoptysis.  She denies any alcohol or other illicit drug use. In the ED, the patient was afebrile and hemodynamically stable with oxygen saturation 100% on room air.  WBC 13.5, hemoglobin 14.2, platelets 351.  Sodium 131, potassium 2.6, bicarbonate 20.  Troponin 4>>4 CTA head and neck was negative for LVO.  There is no intracranial stenosis.  There is no dural venous thrombosis.  Soft tissues were negative for abscess or concerning abnormalities.  EKG was sinus rhythm without any ST/T wave changes.  Chest x-ray was negative for acute findings.  Review of Systems: As mentioned in the history of present illness. All other systems reviewed and are negative. Past Medical History:  Diagnosis Date   Hypertension    Renal disorder     Social History:  reports that she has been smoking cigarettes. She has never used smokeless tobacco. She reports current drug use. Drug: Cocaine. She reports that she does not drink alcohol.  Surgical history--none  Family Hx--no history of premature coronary disease  Home meds--none Physical Exam: Vitals:   07/30/23 0600 07/30/23 0919 07/30/23 0922 07/30/23 0930  BP: (!) 162/112 (!) 142/105  (!) 140/98  Pulse: 95 86  87  Resp: 17 15  14   Temp:    97.9 F (36.6 C)   TempSrc:   Oral   SpO2: 97% 98%  99%  Weight:      Height:       GENERAL:  A&O x 3, NAD, well developed, cooperative, follows commands HEENT: [place]/AT, No thrush, No icterus, No oral ulcers Neck:  No neck mass, No meningismus, soft, supple CV: RRR, no S3, no S4, no rub, no JVD Lungs:  CTA, no wheeze, no rhonchi, good air movement Abd: soft/NT +BS, nondistended Ext: No edema, no lymphangitis, no cyanosis, no rashes Neuro:  CN II-XII intact, strength 4/5 in RUE, RLE, strength 4/5 LUE, LLE; sensation intact bilateral; no dysmetria; babinski equivocal  Data Reviewed: Data reviewed above in history  Assessment and Plan: Atypical chest pain -troponin 4>>4 -echo -monitor on tele  Hypertensive urgency -not on any meds at home -related to cocaine use  Hypokalemia -repleting IV and po -check mag  Cocaine use -UDS -cessation discussed  Obesity -BMI 30.41 -lifestyle modification    Advance Care Planning: FULL  Consults: none  Family Communication: none present  Severity of Illness: The appropriate patient status for this patient is OBSERVATION. Observation status is judged to be reasonable and necessary in order to provide the required intensity of service to ensure the patient's safety. The patient's presenting symptoms, physical exam findings, and initial radiographic and laboratory data in the context of their medical condition is felt to place them at decreased risk for further clinical deterioration. Furthermore, it is anticipated that the patient will be medically stable for discharge from  the hospital within 2 midnights of admission.   Author: Alm Schneider, MD 07/30/2023 10:07 AM  For on call review [email or web address].

## 2023-07-30 NOTE — ED Provider Notes (Signed)
 I took over care of this patient at 7 AM. Patient is here for chest pain after cocaine use. Chest pain improved but now having neck pain. CTA pending at this time.  Physical Exam  BP (!) 140/98   Pulse 87   Temp 97.9 F (36.6 C) (Oral)   Resp 14   Ht 5' 8 (1.727 m)   Wt 90.7 kg   SpO2 99%   BMI 30.41 kg/m   Physical Exam Vitals and nursing note reviewed.  Constitutional:      General: She is in acute distress.     Appearance: She is well-developed.     Comments: Anxious and uncomfortable appearing   HENT:     Head: Normocephalic and atraumatic.  Eyes:     Conjunctiva/sclera: Conjunctivae normal.  Cardiovascular:     Rate and Rhythm: Normal rate and regular rhythm.     Heart sounds: No murmur heard. Pulmonary:     Effort: Pulmonary effort is normal. No respiratory distress.     Breath sounds: Normal breath sounds.  Abdominal:     Palpations: Abdomen is soft.     Tenderness: There is no abdominal tenderness.  Musculoskeletal:        General: No swelling.     Cervical back: Neck supple.  Skin:    General: Skin is warm and dry.     Capillary Refill: Capillary refill takes less than 2 seconds.  Neurological:     Mental Status: She is alert.  Psychiatric:        Mood and Affect: Mood normal.     Procedures  Procedures  ED Course / MDM    Medical Decision Making Patient was negative CT of her head and neck.  She still very uncomfortable, negative troponin initially but she is still hypertensive despite multiple doses of Ativan .  Will give her Reglan  and Benadryl  to see if it helps with her headache.  Fioricet  and Tylenol  did not help.  Will also give her some hydralazine  to see if it helps with her blood pressure.  Discussed with Dr. Evonnie, hospitalist and patient will be admitted for further workup and management of her hypertensive urgency and hyperkalemia and chest pain.  Potassium was replaced as well IV and p.o.  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  Risk Prescription drug management. Drug therapy requiring intensive monitoring for toxicity. Decision regarding hospitalization. Diagnosis or treatment significantly limited by social determinants of health.          Gennaro Duwaine CROME, DO 07/30/23 [phone]

## 2023-07-30 NOTE — ED Notes (Signed)
 EDP at bedside

## 2023-07-30 NOTE — ED Provider Notes (Signed)
 [facility]-EMERGENCY DEPT [facility] Emergency Department Provider Note MRN:  [id number]  Arrival date & time: 07/30/23     Chief Complaint   Chest pain History of Present Illness   [name] is a 38 y.o. year-old female with a history of substance use disorder presenting to the ED with chief complaint of chest pain.  Patient relapsed on crack cocaine this evening.  Had been sober for 1 year.  Having chest pain currently.  Also having pain in the left lateral neck.  Review of Systems  A thorough review of systems was obtained and all systems are negative except as noted in the HPI and PMH.   Patient's Health History    Past Medical History:  Diagnosis Date   Hypertension    Renal disorder       No family history on file.  Social History   Socioeconomic History   Marital status: Legally Separated    Spouse name: Not on file   Number of children: Not on file   Years of education: Not on file   Highest education level: Not on file  Occupational History   Not on file  Tobacco Use   Smoking status: Every Day    Types: Cigarettes   Smokeless tobacco: Never  Substance and Sexual Activity   Alcohol use: Never   Drug use: Yes    Types: Cocaine    Comment: relapsed 7/13   Sexual activity: Not on file  Other Topics Concern   Not on file  Social History Narrative   Not on file   Social Drivers of Health   Financial Resource Strain: Not on file  Food Insecurity: Not on file  Transportation Needs: Not on file  Physical Activity: Not on file  Stress: Not on file  Social Connections: Not on file  Intimate Partner Violence: Not on file     Physical Exam   Vitals:   07/30/23 0512 07/30/23 0600  BP:  (!) 162/112  Pulse: 79 95  Resp: 19 17  SpO2: 100% 97%    CONSTITUTIONAL: Well-appearing, NAD NEURO/PSYCH:  Alert and oriented x 3, no focal deficits EYES:  eyes equal and reactive ENT/NECK:  no LAD, no JVD CARDIO: Regular rate, well-perfused, normal S1 and  S2 PULM:  CTAB no wheezing or rhonchi GI/GU:  non-distended, non-tender MSK/SPINE:  No gross deformities, no edema SKIN:  no rash, atraumatic   *Additional and/or pertinent findings included in MDM below  Diagnostic and Interventional Summary    EKG Interpretation Date/Time:  Sunday July 30 2023 05:11:41 EDT Ventricular Rate:  83 PR Interval:  126 QRS Duration:  98 QT Interval:  379 QTC Calculation: 446 R Axis:   58  Text Interpretation: Sinus rhythm Abnormal inferior Q waves Confirmed by Theadore Sharper [phone]) on 07/30/2023 5:16:27 AM       Labs Reviewed  CBC - Abnormal; Notable for the following components:      Result Value   WBC 13.5 (*)    All other components within normal limits  BASIC METABOLIC PANEL WITH GFR - Abnormal; Notable for the following components:   Sodium 131 (*)    Potassium 2.6 (*)    CO2 20 (*)    All other components within normal limits  URINALYSIS, ROUTINE W REFLEX MICROSCOPIC - Abnormal; Notable for the following components:   Color, Urine COLORLESS (*)    Specific Gravity, Urine 1.002 (*)    All other components within normal limits  TROPONIN I (HIGH SENSITIVITY)  DG Chest Port 1 View  Final Result    CT ANGIO HEAD NECK W WO CM    (Results Pending)    Medications  potassium chloride  10 mEq in 100 mL IVPB (10 mEq Intravenous New Bag/Given 07/30/23 0616)  LORazepam  (ATIVAN ) injection 2 mg (has no administration in time range)  iohexol  (OMNIPAQUE ) 350 MG/ML injection 75 mL (has no administration in time range)  LORazepam  (ATIVAN ) injection 2 mg (2 mg Intravenous Given 07/30/23 0542)  potassium chloride  SA (KLOR-CON  M) CR tablet 40 mEq (40 mEq Oral Given 07/30/23 9383)     Procedures  /  Critical Care Procedures  ED Course and Medical Decision Making  Initial Impression and Ddx Chest pain in the setting of crack cocaine use.  Well-appearing in no acute distress, hypertensive, does not appear uncomfortable.  Patient is more worried about  her left lateral neck pain which seems to be bothering her but has been present on and off for several days.  Past medical/surgical history that increases complexity of ED encounter:  substance use disorder  Interpretation of Diagnostics I personally reviewed the EKG and my interpretation is as follows:  SR without ischemic changes  Hypokalemia.  First troponin negative  Patient Reassessment and Ultimate Disposition/Management     Awaiting CTA head and neck to exclude carotid dissection.  Chest pain largely resolved, highly doubt aortic dissection.  Will need second troponin as well, potassium repletion.  Signed out to oncoming provider at shift change.  Patient management required discussion with the following services or consulting groups:  None  Complexity of Problems Addressed Acute illness or injury that poses threat of life of bodily function  Additional Data Reviewed and Analyzed Further history obtained from: Prior labs/imaging results  Additional Factors Impacting ED Encounter Risk Consideration of hospitalization  Ozell HERO. Theadore, MD [facility] Emergency [facility] [place] [facility] [email or web address]  Final Clinical Impressions(s) / ED Diagnoses     ICD-10-CM   1. Chest pain, unspecified type  R07.9     2. Crack cocaine use  F14.90       ED Discharge Orders     None        Discharge Instructions Discussed with and Provided to Patient:   Discharge Instructions   None      Theadore Ozell HERO, MD 07/30/23 [phone]

## 2023-07-30 NOTE — Hospital Course (Addendum)
 38 year old female with a history of substance use disorder and hypertension presenting with chest pain that is rating up to her left lateral neck.  She stated that it started after she smoked cocaine on the evening of 07/29/2023.  The patient states that has been constant.  She denies any fevers, chills, shortness breath, nausea, vomiting, diarrhea abdominal pain, dysuria, hematuria.  She denies any coughing or hemoptysis.  She denies any alcohol or other illicit drug use. In the ED, the patient was afebrile and hemodynamically stable with oxygen saturation 100% on room air.  WBC 13.5, hemoglobin 14.2, platelets 351.  Sodium 131, potassium 2.6, bicarbonate 20.  Troponin 4>>4 CTA head and neck was negative for LVO.  There is no intracranial stenosis.  There is no dural venous thrombosis.  Soft tissues were negative for abscess or concerning abnormalities.  EKG was sinus rhythm without any ST/T wave changes.  Chest x-ray was negative for acute findings.  The patient was initially hypertensive with blood pressure 187/127.

## 2023-07-30 NOTE — ED Notes (Signed)
 Pt restless. In and out of bed walking around room.

## 2023-07-30 NOTE — ED Notes (Signed)
 Pt lying in bed A&Ox4. Ambulatory. Pt is crying and stating, are y'all going to do something about the pain. My neck and chest hurt so much. I just need something.  EDP has been made aware of pt's concerns.

## 2023-07-30 NOTE — Progress Notes (Signed)
  Echocardiogram 2D Echocardiogram has been performed.  [name] 07/30/2023, 3:25 PM

## 2023-07-30 NOTE — ED Notes (Signed)
 Pt is currently sleeping

## 2023-07-30 NOTE — Discharge Summary (Addendum)
 Physician Discharge Summary   Patient: [name] MRN: [id number] DOB: 10-31-1985  Admit date:     07/30/2023  Discharge date: 07/30/23  Discharge Physician: Alm Kue Fox   PCP: Patient, No Pcp Per   Recommendations at discharge:   Please follow up with primary care provider within 1-2 weeks  Please repeat BMP and CBC in one week   Hospital Course: 38 year old female with a history of substance use disorder and hypertension presenting with chest pain that is rating up to her left lateral neck.  She stated that it started after she smoked cocaine on the evening of 07/29/2023.  The patient states that has been constant.  She denies any fevers, chills, shortness breath, nausea, vomiting, diarrhea abdominal pain, dysuria, hematuria.  She denies any coughing or hemoptysis.  She denies any alcohol or other illicit drug use. In the ED, the patient was afebrile and hemodynamically stable with oxygen saturation 100% on room air.  WBC 13.5, hemoglobin 14.2, platelets 351.  Sodium 131, potassium 2.6, bicarbonate 20.  Troponin 4>>4 CTA head and neck was negative for LVO.  There is no intracranial stenosis.  There is no dural venous thrombosis.  Soft tissues were negative for abscess or concerning abnormalities.  EKG was sinus rhythm without any ST/T wave changes.  Chest x-ray was negative for acute findings.  The patient was initially hypertensive with blood pressure 187/127.  Assessment and Plan: Atypical chest pain -troponin 4>>4 -echo EF 65-70% no WMA, normal RVF, trivial MR -monitor on tele--no dyrhythmia -reproducible on exam -personally reviewed EKG--no STT changes -stable on RA without tachycardia   Hypertensive urgency -not on any meds at home -related to cocaine use -overall became normotensive as pt was monitored   Hypokalemia -repleting IV and po -K = 3.7 at time of dc   Cocaine use -UDS + cocaine -cessation discussed   Obesity -BMI 30.41 -lifestyle modification         Consultants: none Procedures performed: none  Disposition: Home Diet recommendation:  Regular diet DISCHARGE MEDICATION: Allergies as of 07/30/2023       Reactions   Amoxicillin Nausea Only   Neomycin-polymyxin-hc Rash, Other (See Comments)   Pt states she normally breaks out with antibiotics Antibiotics ear   Penicillins Nausea Only        Medication List    You have not been prescribed any medications.     Discharge Exam: Filed Weights   07/30/23 0507  Weight: 90.7 kg   HEENT:  [place]/AT, No thrush, no icterus CV:  RRR, no rub, no S3, no S4 Lung:  CTA, no wheeze, no rhonchi Abd:  soft/+BS, NT Ext:  No edema, no lymphangitis, no synovitis, no rash   Condition at discharge: stable  The results of significant diagnostics from this hospitalization (including imaging, microbiology, ancillary and laboratory) are listed below for reference.   Imaging Studies: ECHOCARDIOGRAM COMPLETE Result Date: 07/30/2023    ECHOCARDIOGRAM REPORT   Patient Name:   [name] Date of Exam: 07/30/2023 Medical Rec #:  [id number]         Height:       68.0 in Accession #:    [id number]        Weight:       200.0 lb Date of Birth:  1985/02/11          BSA:          2.044 m Patient Age:    38 years          BP:  131/95 mmHg Patient Gender: F                 HR:           77 bpm. Exam Location:  [facility] Procedure: 2D Echo, Color Doppler, Cardiac Doppler and Intracardiac            Opacification Agent (Both Spectral and Color Flow Doppler were            utilized during procedure). Indications:    chest pain  History:        Patient has no prior history of Echocardiogram examinations.                 Risk Factors:Current Smoker, Hypertension and cocaine use.  Sonographer:    Tinnie Barefoot RDCS Referring Phys: [phone] Wolfe [place] IMPRESSIONS  1. Left ventricular ejection fraction, by estimation, is 65 to 70%. The left ventricle has normal function. The left ventricle has no regional wall  motion abnormalities. There is mild left ventricular hypertrophy. Left ventricular diastolic parameters were normal.  2. Right ventricular systolic function is normal. The right ventricular size is normal. Tricuspid regurgitation signal is inadequate for assessing PA pressure.  3. The mitral valve is normal in structure. Trivial mitral valve regurgitation. No evidence of mitral stenosis.  4. The aortic valve is grossly normal. Aortic valve regurgitation is not visualized. No aortic stenosis is present.  5. The inferior vena cava is normal in size with <50% respiratory variability, suggesting right atrial pressure of 8 mmHg. FINDINGS  Left Ventricle: Left ventricular ejection fraction, by estimation, is 65 to 70%. The left ventricle has normal function. The left ventricle has no regional wall motion abnormalities. Definity  contrast agent was given IV to delineate the left ventricular  endocardial borders. The left ventricular internal cavity size was normal in size. There is mild left ventricular hypertrophy. Left ventricular diastolic parameters were normal. Right Ventricle: The right ventricular size is normal. No increase in right ventricular wall thickness. Right ventricular systolic function is normal. Tricuspid regurgitation signal is inadequate for assessing PA pressure. Left Atrium: Left atrial size was normal in size. Right Atrium: Right atrial size was normal in size. Pericardium: There is no evidence of pericardial effusion. Mitral Valve: The mitral valve is normal in structure. Trivial mitral valve regurgitation. No evidence of mitral valve stenosis. Tricuspid Valve: The tricuspid valve is normal in structure. Tricuspid valve regurgitation is trivial. No evidence of tricuspid stenosis. Aortic Valve: The aortic valve is grossly normal. Aortic valve regurgitation is not visualized. No aortic stenosis is present. Pulmonic Valve: The pulmonic valve was normal in structure. Pulmonic valve regurgitation is  trivial. No evidence of pulmonic stenosis. Aorta: The aortic root is normal in size and structure. Venous: The inferior vena cava is normal in size with less than 50% respiratory variability, suggesting right atrial pressure of 8 mmHg. IAS/Shunts: The atrial septum is grossly normal.  LEFT VENTRICLE PLAX 2D LVIDd:         4.10 cm   Diastology LVIDs:         2.50 cm   LV e' medial:    9.46 cm/s LV PW:         1.10 cm   LV E/e' medial:  8.9 LV IVS:        1.10 cm   LV e' lateral:   13.90 cm/s LVOT diam:     1.90 cm   LV E/e' lateral: 6.1 LV SV:  69 LV SV Index:   34 LVOT Area:     2.84 cm  RIGHT VENTRICLE             IVC RV Basal diam:  2.70 cm     IVC diam: 2.10 cm RV S prime:     15.80 cm/s TAPSE (M-mode): 2.8 cm LEFT ATRIUM             Index        RIGHT ATRIUM           Index LA diam:        3.40 cm 1.66 cm/m   RA Area:     14.00 cm LA Vol (A2C):   34.6 ml 16.93 ml/m  RA Volume:   35.30 ml  17.27 ml/m LA Vol (A4C):   45.6 ml 22.31 ml/m LA Biplane Vol: 43.6 ml 21.33 ml/m  AORTIC VALVE LVOT Vmax:   124.00 cm/s LVOT Vmean:  85.000 cm/s LVOT VTI:    0.242 m  AORTA Ao Root diam: 2.80 cm Ao Asc diam:  3.10 cm MITRAL VALVE MV Area (PHT): 4.15 cm    SHUNTS MV Decel Time: 183 msec    Systemic VTI:  0.24 m MV E velocity: 84.60 cm/s  Systemic Diam: 1.90 cm MV A velocity: 51.10 cm/s MV E/A ratio:  1.66 Soyla Merck MD Electronically signed by Soyla Merck MD Signature Date/Time: 07/30/2023/4:59:48 PM    Final    CT ANGIO HEAD NECK W WO CM Result Date: 07/30/2023 EXAM: CT HEAD WITHOUT CTA HEAD AND NECK WITH 07/30/2023 07:49:31 AM TECHNIQUE: CTA of the head and neck was performed with the administration of intravenous contrast. Noncontrast CT of the head with reconstructed 2-D images are also provided for review. Multiplanar 2D and/or 3D reformatted images are provided for review. Automated exposure control, iterative reconstruction, and/or weight based adjustment of the mA/kV was utilized to reduce the  radiation dose to as low as reasonably achievable. COMPARISON: CT head without contrast 08/08/2020 CLINICAL HISTORY: Carotid artery dissection suspected. Patient relapsed on crack cocaine this evening. Had been sober for 1 year. Having chest pain and pain in the left lateral neck. FINDINGS: CT HEAD: BRAIN AND VENTRICLES: No acute intracranial hemorrhage. No mass effect or midline shift. No extra-axial fluid collection. Gray-white differentiation is maintained. No hydrocephalus. ORBITS: No acute abnormality. SINUSES: No acute abnormality. SOFT TISSUES AND SKULL: No acute abnormality. CTA NECK: AORTIC ARCH AND ARCH VESSELS: No dissection or arterial injury. No significant stenosis of the brachiocephalic or subclavian arteries. Common origin of the left common carotid artery and the innominate artery is noted, a normal variant. CERVICAL CAROTID ARTERIES: No dissection, arterial injury, or hemodynamically significant stenosis by NASCET criteria. CERVICAL VERTEBRAL ARTERIES: No dissection, arterial injury, or significant stenosis. The left vertebral artery is the dominant vessel. VISUALIZED LUNGS AND MEDIASTINUM: Unremarkable. SOFT TISSUES: No acute abnormality. BONES: No acute abnormality. CTA HEAD: ANTERIOR CIRCULATION: No significant stenosis of the internal carotid arteries. No significant stenosis of the anterior cerebral arteries. No significant stenosis of the middle cerebral arteries. No aneurysm. POSTERIOR CIRCULATION: No significant stenosis of the posterior cerebral arteries. No significant stenosis of the basilar artery. No significant stenosis of the vertebral arteries. No aneurysm. OTHER: No dural venous sinus thrombosis on this non-dedicated study. IMPRESSION: 1. No acute intracranial hemorrhage. 2. No large vessel occlusion, hemodynamically significant stenosis, or aneurysm in the head or neck. Electronically signed by: Lonni Necessary MD 07/30/2023 07:57 AM EDT RP Workstation: [id number]   DG Chest  Port 1 View Result Date: 07/30/2023 CLINICAL DATA:  Chest pain and hypertension. EXAM: PORTABLE CHEST 1 VIEW COMPARISON:  PA chest 03/04/2021 FINDINGS: The heart size and mediastinal contours are within normal limits. Both lungs are clear. The visualized skeletal structures are unremarkable. IMPRESSION: No active disease. Electronically Signed   By: Francis Quam M.D.   On: 07/30/2023 06:00    Microbiology: No results found for this or any previous visit.  Labs: CBC: Recent Labs  Lab 07/30/23 0527  WBC 13.5*  HGB 14.2  HCT 40.7  MCV 85.1  PLT 351   Basic Metabolic Panel: Recent Labs  Lab 07/30/23 0527 07/30/23 1617  NA 131*  --   K 2.6* 3.7  CL 98  --   CO2 20*  --   GLUCOSE 93  --   BUN 9  --   CREATININE 0.71  --   CALCIUM 9.2  --    Liver Function Tests: No results for input(s): AST, ALT, ALKPHOS, BILITOT, PROT, ALBUMIN in the last 168 hours. CBG: No results for input(s): GLUCAP in the last 168 hours.  Discharge time spent: greater than 30 minutes.  Signed: Alm Schneider, MD [facility] 07/30/2023

## 2023-07-30 NOTE — Plan of Care (Signed)

## 2023-07-30 NOTE — ED Triage Notes (Signed)
 Pt arrives via [facility], c/o chest pain after using cocaine tonight for the first time in over a year. Hypertensive.

## 2023-07-30 NOTE — ED Notes (Signed)
 Pt lying in bed yelling and crying. Unable to understand pt at this time. This nurse has attempted to console pt.

## 2023-07-31 ENCOUNTER — Encounter (HOSPITAL_BASED_OUTPATIENT_CLINIC_OR_DEPARTMENT_OTHER): Payer: Self-pay | Admitting: Family Medicine

## 2023-08-18 ENCOUNTER — Emergency Department (HOSPITAL_COMMUNITY)

## 2023-08-18 ENCOUNTER — Emergency Department (HOSPITAL_COMMUNITY)
Admission: EM | Admit: 2023-08-18 | Discharge: 2023-08-18 | Disposition: A | Discharge status: Home / Self Care | Attending: Emergency Medicine | Admitting: Emergency Medicine

## 2023-08-18 ENCOUNTER — Other Ambulatory Visit: Payer: Self-pay

## 2023-08-18 ENCOUNTER — Encounter (HOSPITAL_COMMUNITY): Payer: Self-pay | Admitting: Emergency Medicine

## 2023-08-18 DIAGNOSIS — R0789 Other chest pain: Secondary | ICD-10-CM | POA: Diagnosis not present

## 2023-08-18 DIAGNOSIS — I10 Essential (primary) hypertension: Secondary | ICD-10-CM | POA: Diagnosis not present

## 2023-08-18 DIAGNOSIS — R079 Chest pain, unspecified: Secondary | ICD-10-CM

## 2023-08-18 DIAGNOSIS — J45909 Unspecified asthma, uncomplicated: Secondary | ICD-10-CM | POA: Insufficient documentation

## 2023-08-18 HISTORY — DX: Cocaine use, unspecified, uncomplicated: F14.90

## 2023-08-18 LAB — BASIC METABOLIC PANEL WITH GFR
Anion gap: 13 (ref 5–15)
BUN: 8 mg/dL (ref 6–20)
CO2: 19 mmol/L — ABNORMAL LOW (ref 22–32)
Calcium: 9 mg/dL (ref 8.9–10.3)
Chloride: 108 mmol/L (ref 98–111)
Creatinine, Ser: 0.66 mg/dL (ref 0.44–1.00)
GFR, Estimated: >60 mL/min (ref >60)
Glucose, Bld: 99 mg/dL (ref 70–99)
Potassium: 3 mmol/L — ABNORMAL LOW (ref 3.5–5.1)
Sodium: 140 mmol/L (ref 135–145)

## 2023-08-18 LAB — CBC
HCT: 38.6 % (ref 36.0–46.0)
Hemoglobin: 13.3 g/dL (ref 12.0–15.0)
MCH: 30.4 pg (ref 26.0–34.0)
MCHC: 34.5 g/dL (ref 30.0–36.0)
MCV: 88.3 fL (ref 80.0–100.0)
Platelets: 315 K/uL (ref 150–400)
RBC: 4.37 MIL/uL (ref 3.87–5.11)
RDW: 13.6 % (ref 11.5–15.5)
WBC: 14 K/uL — ABNORMAL HIGH (ref 4.0–10.5)
nRBC: 0 % (ref 0.0–0.2)

## 2023-08-18 LAB — TROPONIN I (HIGH SENSITIVITY)
Troponin I (High Sensitivity): 6 ng/L (ref <18)
Troponin I (High Sensitivity): 6 ng/L (ref <18)

## 2023-08-18 LAB — POC URINE PREG, ED: Preg Test, Ur: NEGATIVE

## 2023-08-18 MED ORDER — LORAZEPAM 1 MG PO TABS
1.0000 mg | ORAL_TABLET | Freq: Once | ORAL | Status: AC
  Administered 2023-08-18: 1 mg via ORAL
  Filled 2023-08-18: qty 1

## 2023-08-18 MED ORDER — ACETAMINOPHEN 325 MG PO TABS
650.0000 mg | ORAL_TABLET | Freq: Once | ORAL | Status: AC
  Administered 2023-08-18: 650 mg via ORAL
  Filled 2023-08-18: qty 2

## 2023-08-18 MED ORDER — LORAZEPAM 2 MG/ML IJ SOLN: 1.0000 mg | Freq: Once | INTRAMUSCULAR | Status: DC

## 2023-08-18 NOTE — Discharge Instructions (Signed)
 Your workup was reassuring.  Your blood pressure is elevated but likely due to the events and the cocaine.  Follow-up with primary care doctor.

## 2023-08-18 NOTE — ED Triage Notes (Signed)
 Pt called ems after using cocaine at 4am c/o chest pain and pain all over. St in route. A/o. 96 cbg. Pt hyperactive. C/o shob. No obvious shob noted. Non diaphoretic. Mm dry.

## 2023-08-18 NOTE — ED Provider Notes (Signed)
 [name] Provider Note   CSN: [id number] Arrival date & time: 08/18/23  9280     Patient presents with: Chest Pain   [name] is a 38 y.o. female.    Chest Pain Patient events of chest pain.  Going to neck.  Has had recently.  States this feels different however.  Had an admission around 3 weeks ago to the hospital for similar symptoms.  Had hypertension due to cocaine use.  Did use cocaine last night.  Pain in left chest going to left arm.  States she was having symptoms and then she left town And stop doing drugs.  States came back and began to have more symptoms.  States is different that she had before.  But still left chest.  States family history of aneurysms.   Past Medical History:  Diagnosis Date   Acute renal failure (HCC)    historically, almost had to go on dialysis, result of diuretics   Anxiety    Asthma    Bipolar affective disorder in remission (HCC)    Cocaine use    Headache    Heart palpitations    Hypertension    OCD (obsessive compulsive disorder)    OCD (obsessive compulsive disorder)    Pneumonia    PONV (postoperative nausea and vomiting)    pt denies   Renal disorder     Prior to Admission medications   Medication Sig Start Date End Date Taking? Authorizing Provider  doxycycline  (VIBRAMYCIN ) 100 MG capsule Take 1 capsule (100 mg total) by mouth 2 (two) times daily. 03/04/21   Mahoney, Caitlin, MD  OLANZapine  (ZYPREXA ) 5 MG tablet Take 1 tablet (5 mg total) by mouth at bedtime. Patient not taking: Reported on 03/04/2021 08/10/20   Steinl, Kevin, MD  ondansetron  (ZOFRAN ) 4 MG tablet Take 1 tablet (4 mg total) by mouth every 8 (eight) hours as needed for nausea or vomiting. 03/04/21   Mahoney, Caitlin, MD  oxyCODONE -acetaminophen  (PERCOCET/ROXICET) 5-325 MG tablet Take 1 tablet by mouth every 4 (four) hours as needed for severe pain. 03/04/21   Mahoney, Caitlin, MD  traZODone  (DESYREL ) 50 MG tablet  Take 1 tablet (50 mg total) by mouth at bedtime. Patient not taking: Reported on 03/04/2021 08/10/20   Steinl, Kevin, MD    Allergies: Amoxicillin, Amoxicillin, Neomycin-polymyxin-hc, and Penicillins    Review of Systems  Cardiovascular:  Positive for chest pain.    Updated Vital Signs BP (!) 166/105   Pulse 83   Temp 97.7 F (36.5 C) (Oral)   Resp 17   LMP 07/21/2023 (Approximate)   SpO2 99%   Physical Exam Vitals and nursing note reviewed.  Neck:     Comments: No midline neck tenderness.  Good range of motion. Cardiovascular:     Rate and Rhythm: Regular rhythm.  Pulmonary:     Breath sounds: No wheezing or rhonchi.  Chest:     Chest wall: Tenderness present.     Comments: Some tenderness to left anterior chest wall. Abdominal:     Tenderness: There is no abdominal tenderness.  Musculoskeletal:     Right lower leg: No edema.     Left lower leg: No edema.  Skin:    General: Skin is warm.  Neurological:     Mental Status: She is alert.     (all labs ordered are listed, but only abnormal results are displayed) Labs Reviewed  BASIC METABOLIC PANEL WITH GFR - Abnormal; Notable for the following  components:      Result Value   Potassium 3.0 (*)    CO2 19 (*)    All other components within normal limits  CBC - Abnormal; Notable for the following components:   WBC 14.0 (*)    All other components within normal limits  POC URINE PREG, ED  TROPONIN I (HIGH SENSITIVITY)  TROPONIN I (HIGH SENSITIVITY)    EKG: EKG Interpretation Date/Time:  Friday August 18 2023 07:29:36 EDT Ventricular Rate:  85 PR Interval:  120 QRS Duration:  94 QT Interval:  379 QTC Calculation: 451 R Axis:   84  Text Interpretation: Sinus rhythm Confirmed by Patsey Lot [phone]) on 08/18/2023 8:05:57 AM  Radiology: [facility] Chest 2 View Result Date: 08/18/2023 CLINICAL DATA:  Chest pain and shortness of breath. EXAM: CHEST - 2 VIEW COMPARISON:  July 30, 2023. FINDINGS: The heart size and  mediastinal contours are within normal limits. Both lungs are clear. The visualized skeletal structures are unremarkable. IMPRESSION: No active cardiopulmonary disease. Electronically Signed   By: Lynwood Landy Raddle M.D.   On: 08/18/2023 08:29     Procedures   Medications Ordered in the ED  LORazepam  (ATIVAN ) tablet 1 mg (1 mg Oral Given 08/18/23 0814)  acetaminophen  (TYLENOL ) tablet 650 mg (650 mg Oral Given 08/18/23 1040)  LORazepam  (ATIVAN ) tablet 1 mg (1 mg Oral Given 08/18/23 1114)                                    Medical Decision Making Amount and/or Complexity of Data Reviewed Labs: ordered. Radiology: ordered.  Risk OTC drugs. Prescription drug management.   Patient with chest pain.  Left chest.  Reproducible.  Differential diagnose includes various causes including coronary artery disease although felt less likely.  Could be cocaine related chest pain.  Also musculoskeletal pain.  Did have paresthesias in left arm.  Reviewing notes 3 weeks ago did have CTA.  Reassuring.  No aneurysm in head neck or upper chest.  EKG reassuring.  Will give some Ativan  to help with symptoms.  Hypertensive here but reviewing notes blood pressure normalized but not using cocaine.  Workup reassuring.  Still some hypertension but reviewing notes did not have hypertension when not using.  Do not think we need to start medicine at this time.  Feeling somewhat better but does have some likely underlying anxiety.  However appears stable for discharge home.     Final diagnoses:  Nonspecific chest pain    ED Discharge Orders     None          Patsey Lot, MD 08/18/23 1119
